# Patient Record
Sex: Male | Born: 1955 | Race: White | Hispanic: No | Marital: Married | Smoking: Former smoker
Health system: Southern US, Community
[De-identification: ages and names within clinical notes are randomized; demographics above are authoritative.]

## PROBLEM LIST (undated history)

## (undated) DIAGNOSIS — M353 Polymyalgia rheumatica: Secondary | ICD-10-CM

## (undated) DIAGNOSIS — E039 Hypothyroidism, unspecified: Secondary | ICD-10-CM

## (undated) DIAGNOSIS — E669 Obesity, unspecified: Secondary | ICD-10-CM

## (undated) DIAGNOSIS — E785 Hyperlipidemia, unspecified: Secondary | ICD-10-CM

## (undated) DIAGNOSIS — M109 Gout, unspecified: Secondary | ICD-10-CM

## (undated) DIAGNOSIS — I10 Essential (primary) hypertension: Secondary | ICD-10-CM

## (undated) DIAGNOSIS — Z8619 Personal history of other infectious and parasitic diseases: Secondary | ICD-10-CM

## (undated) DIAGNOSIS — Z87891 Personal history of nicotine dependence: Secondary | ICD-10-CM

## (undated) DIAGNOSIS — R011 Cardiac murmur, unspecified: Secondary | ICD-10-CM

## (undated) DIAGNOSIS — Z86010 Personal history of colonic polyps: Secondary | ICD-10-CM

## (undated) HISTORY — PX: ROTATOR CUFF REPAIR: SHX139

## (undated) HISTORY — DX: Gout, unspecified: M10.9

## (undated) HISTORY — DX: Essential (primary) hypertension: I10

## (undated) HISTORY — DX: Personal history of colonic polyps: Z86.010

## (undated) HISTORY — DX: Obesity, unspecified: E66.9

## (undated) HISTORY — DX: Cardiac murmur, unspecified: R01.1

## (undated) HISTORY — DX: Hyperlipidemia, unspecified: E78.5

## (undated) HISTORY — DX: Hypothyroidism, unspecified: E03.9

## (undated) HISTORY — DX: Personal history of nicotine dependence: Z87.891

## (undated) HISTORY — DX: Personal history of other infectious and parasitic diseases: Z86.19

## (undated) HISTORY — DX: Polymyalgia rheumatica: M35.3

---

## 1993-01-05 HISTORY — PX: VASECTOMY: SHX75

## 1997-05-14 ENCOUNTER — Encounter (HOSPITAL_COMMUNITY): Admission: RE | Admit: 1997-05-14 | Discharge: 1997-08-12 | Payer: Self-pay | Admitting: Psychiatry

## 2005-07-06 ENCOUNTER — Ambulatory Visit: Payer: Self-pay | Admitting: Unknown Physician Specialty

## 2007-01-06 DIAGNOSIS — Z87891 Personal history of nicotine dependence: Secondary | ICD-10-CM

## 2007-01-06 HISTORY — DX: Personal history of nicotine dependence: Z87.891

## 2008-01-06 DIAGNOSIS — Z860100 Personal history of colon polyps, unspecified: Secondary | ICD-10-CM

## 2008-01-06 DIAGNOSIS — I10 Essential (primary) hypertension: Secondary | ICD-10-CM

## 2008-01-06 DIAGNOSIS — E039 Hypothyroidism, unspecified: Secondary | ICD-10-CM

## 2008-01-06 HISTORY — DX: Personal history of colon polyps, unspecified: Z86.0100

## 2008-01-06 HISTORY — DX: Hypothyroidism, unspecified: E03.9

## 2008-01-06 HISTORY — DX: Personal history of colonic polyps: Z86.010

## 2008-01-06 HISTORY — DX: Essential (primary) hypertension: I10

## 2009-07-05 HISTORY — PX: COLONOSCOPY: SHX174

## 2013-03-15 ENCOUNTER — Ambulatory Visit (INDEPENDENT_AMBULATORY_CARE_PROVIDER_SITE_OTHER): Payer: 59 | Admitting: Family Medicine

## 2013-03-15 ENCOUNTER — Encounter: Payer: Self-pay | Admitting: Family Medicine

## 2013-03-15 ENCOUNTER — Telehealth: Payer: Self-pay | Admitting: Family Medicine

## 2013-03-15 VITALS — BP 144/80 | HR 84 | Temp 98.3°F | Ht 75.0 in | Wt 279.5 lb

## 2013-03-15 DIAGNOSIS — I6529 Occlusion and stenosis of unspecified carotid artery: Secondary | ICD-10-CM | POA: Insufficient documentation

## 2013-03-15 DIAGNOSIS — E785 Hyperlipidemia, unspecified: Secondary | ICD-10-CM

## 2013-03-15 DIAGNOSIS — I10 Essential (primary) hypertension: Secondary | ICD-10-CM

## 2013-03-15 DIAGNOSIS — Z86010 Personal history of colonic polyps: Secondary | ICD-10-CM | POA: Insufficient documentation

## 2013-03-15 DIAGNOSIS — Z87891 Personal history of nicotine dependence: Secondary | ICD-10-CM | POA: Insufficient documentation

## 2013-03-15 DIAGNOSIS — M109 Gout, unspecified: Secondary | ICD-10-CM

## 2013-03-15 DIAGNOSIS — Z125 Encounter for screening for malignant neoplasm of prostate: Secondary | ICD-10-CM

## 2013-03-15 DIAGNOSIS — E039 Hypothyroidism, unspecified: Secondary | ICD-10-CM

## 2013-03-15 DIAGNOSIS — Z0000 Encounter for general adult medical examination without abnormal findings: Secondary | ICD-10-CM | POA: Insufficient documentation

## 2013-03-15 DIAGNOSIS — R0989 Other specified symptoms and signs involving the circulatory and respiratory systems: Secondary | ICD-10-CM

## 2013-03-15 NOTE — Progress Notes (Signed)
BP 144/80  Pulse 84  Temp(Src) 98.3 F (36.8 C) (Oral)  Ht 6\' 3"  (1.905 m)  Wt 279 lb 8 oz (126.78 kg)  BMI 34.94 kg/m2   CC: new pt to establish would like CPE Subjective:    Patient ID: Robert Shea, male    DOB: Sep 25, 1955, 58 y.o.   MRN: [id number]  HPI: Robert Shea is a 59 y.o. male presenting on 03/15/2013 with Establish Care   Robert Shea presents today to establish care.  Prior saw PA [place].  Here has seen Dr. Hardin Negus.  [place] is primary residence.  Would like to have CPE today.  Hypothyroidism - 2010 - compliant with levothyroxine 166mcg HTN - compliant with amlodipine 10mg  daily HLD - compliant with simvastatin 20mg  daily - takes in am because forgets at night time. Ex smoker - heavy smoking, quit 6 yrs ago Gout - takes prn colchicine.  Flares Qseveral months.  Has tried allopurinol in past - not in last 2 years.  Over last 6 months noticing more frequent flares.  Uloric caused severe gout flare - refuses to retry.  Interested in retrial of allopurinol.  ?carotid bruit on left - s/p carotid US at [facility] - I have requested records today.  Did have stress test done at [facility] recently 2014.  Preventative: Last blood work >1 yr ago. Colonoscopy 2010 2 polyps, rec rpt 5 yrs.  Due for rpt. Prostate - discussed, would like screening for now with PSA/DRE Flu - done Tetanus - unsure  Lives with wife Part time lives in [place] part time local Occ: [employer]: BS Activity: gym 3x/wk  Diet: healthier recently - good water, fruits/vegetables daily  Relevant past medical, surgical, family and social history reviewed and updated as indicated.  Allergies and medications reviewed and updated. No current outpatient prescriptions on file prior to visit.   No current facility-administered medications on file prior to visit.    Review of Systems  Constitutional: Negative for fever, chills, activity change, appetite change, fatigue and  unexpected weight change.  HENT: Negative for hearing loss.   Eyes: Negative for visual disturbance.  Respiratory: Negative for cough, chest tightness, shortness of breath and wheezing.   Cardiovascular: Negative for chest pain, palpitations and leg swelling.  Gastrointestinal: Positive for constipation (occasional). Negative for nausea, vomiting, abdominal pain, diarrhea, blood in stool (occasionally when wiping) and abdominal distention.  Genitourinary: Negative for hematuria and difficulty urinating.  Musculoskeletal: Negative for arthralgias, myalgias and neck pain.  Skin: Negative for rash.  Neurological: Negative for dizziness, seizures, syncope and headaches.  Hematological: Negative for adenopathy. Does not bruise/bleed easily.  Psychiatric/Behavioral: Negative for dysphoric mood. The patient is not nervous/anxious.    Per HPI unless specifically indicated above    Objective:    BP 144/80  Pulse 84  Temp(Src) 98.3 F (36.8 C) (Oral)  Ht 6\' 3"  (1.905 m)  Wt 279 lb 8 oz (126.78 kg)  BMI 34.94 kg/m2  Physical Exam  Nursing note and vitals reviewed. Constitutional: He is oriented to person, place, and time. He appears well-developed and well-nourished. No distress.  HENT:  Head: Normocephalic and atraumatic.  Right Ear: Hearing, tympanic membrane, external ear and ear canal normal.  Left Ear: Hearing, tympanic membrane, external ear and ear canal normal.  Nose: Nose normal.  Mouth/Throat: Uvula is midline, oropharynx is clear and moist and mucous membranes are normal. No oropharyngeal exudate, posterior oropharyngeal edema or posterior oropharyngeal erythema.  Eyes: Conjunctivae  and EOM are normal. Pupils are equal, round, and reactive to light. No scleral icterus.  Neck: Normal range of motion. Neck supple. Carotid bruit is present (L). No thyromegaly present.  Cardiovascular: Normal rate, regular rhythm and intact distal pulses.   Murmur (faint) heard. Pulses:      Radial  pulses are 2+ on the right side, and 2+ on the left side.  Pulmonary/Chest: Effort normal and breath sounds normal. No respiratory distress. He has no wheezes. He has no rales.  Abdominal: Soft. Bowel sounds are normal. He exhibits no distension and no mass. There is no tenderness. There is no rebound and no guarding.  Genitourinary: Rectum normal and prostate normal. Rectal exam shows no external hemorrhoid, no internal hemorrhoid, no fissure, no mass, no tenderness and anal tone normal. Prostate is not enlarged (15gm) and not tender.  Musculoskeletal: Normal range of motion. He exhibits no edema.  Lymphadenopathy:    He has no cervical adenopathy.  Neurological: He is alert and oriented to person, place, and time.  CN grossly intact, station and gait intact  Skin: Skin is warm and dry. No rash noted.  Psychiatric: He has a normal mood and affect. His behavior is normal. Judgment and thought content normal.   No results found for this or any previous visit.    Assessment & Plan:   Problem List Items Addressed This Visit   Ex-smoker   Gout     More frequent flares recently. Discussed starting allopurinol 100mg  daily and will check UA level next week. Discussed use of colchicine or ibuprofen prn while starting allopurinol.    Relevant Orders      Uric acid   Health care maintenance - Primary     Preventative protocols reviewed and updated unless pt declined. Discussed healthy diet and lifestyle. DRE reassuring. PSA when returns fasting.    History of colon polyps     Will refer for colonoscopy.    Relevant Orders      Ambulatory referral to Gastroenterology   HLD (hyperlipidemia)     Continue simvastatin. Check FLP next fasting blood work.    Relevant Medications      amLODipine (NORVASC) 10 MG tablet      simvastatin (ZOCOR) 20 MG tablet   Other Relevant Orders      Lipid panel      Comprehensive metabolic panel   HTN (hypertension)     Chronic, stable. Continue med.      Relevant Orders      Comprehensive metabolic panel   Hypothyroidism     Check TSH next fasting blood work    Relevant Medications      levothyroxine (SYNTHROID, LEVOTHROID) 100 MCG tablet   Other Relevant Orders      TSH   Left carotid bruit     Await ultrasound report from [facility].     Other Visit Diagnoses   Special screening for malignant neoplasm of prostate        Relevant Orders       PSA        Follow up plan: Return in about 6 months (around 09/15/2013), or as needed, for follow up.

## 2013-03-15 NOTE — Assessment & Plan Note (Signed)
More frequent flares recently. Discussed starting allopurinol 100mg  daily and will check UA level next week. Discussed use of colchicine or ibuprofen prn while starting allopurinol.

## 2013-03-15 NOTE — Assessment & Plan Note (Signed)
Preventative protocols reviewed and updated unless pt declined. Discussed healthy diet and lifestyle. DRE reassuring. PSA when returns fasting.

## 2013-03-15 NOTE — Patient Instructions (Signed)
Return fasting for blood work next week. Start allopurinol 100mg  daily. We will set you up for colonoscopy. I will request records from [facility] in [place]. Good to meet you today, call us with questions.

## 2013-03-15 NOTE — Addendum Note (Signed)
Addended by: Ria Bush on: 03/15/2013 11:32 AM   Modules accepted: Level of Service

## 2013-03-15 NOTE — Assessment & Plan Note (Signed)
Check TSH next fasting blood work

## 2013-03-15 NOTE — Assessment & Plan Note (Signed)
Continue simvastatin. Check FLP next fasting blood work.

## 2013-03-15 NOTE — Assessment & Plan Note (Signed)
Chronic, stable. Continue med. 

## 2013-03-15 NOTE — Assessment & Plan Note (Signed)
Await ultrasound report from [facility].

## 2013-03-15 NOTE — Telephone Encounter (Signed)
Relevant patient education assigned to patient using Emmi. ° °

## 2013-03-15 NOTE — Progress Notes (Signed)
Pre visit review using our clinic review tool, if applicable. No additional management support is needed unless otherwise documented below in the visit note. 

## 2013-03-15 NOTE — Assessment & Plan Note (Signed)
Will refer for colonoscopy

## 2013-03-22 ENCOUNTER — Other Ambulatory Visit (INDEPENDENT_AMBULATORY_CARE_PROVIDER_SITE_OTHER): Payer: 59

## 2013-03-22 DIAGNOSIS — I10 Essential (primary) hypertension: Secondary | ICD-10-CM

## 2013-03-22 DIAGNOSIS — E039 Hypothyroidism, unspecified: Secondary | ICD-10-CM

## 2013-03-22 DIAGNOSIS — Z125 Encounter for screening for malignant neoplasm of prostate: Secondary | ICD-10-CM

## 2013-03-22 DIAGNOSIS — E785 Hyperlipidemia, unspecified: Secondary | ICD-10-CM

## 2013-03-22 DIAGNOSIS — M109 Gout, unspecified: Secondary | ICD-10-CM

## 2013-03-22 LAB — COMPREHENSIVE METABOLIC PANEL
ALBUMIN: 4.8 g/dL (ref 3.5–5.2)
ALT: 30 U/L (ref 0–53)
AST: 24 U/L (ref 0–37)
Alkaline Phosphatase: 63 U/L (ref 39–117)
BUN: 21 mg/dL (ref 6–23)
CO2: 23 mEq/L (ref 19–32)
CREATININE: 1.1 mg/dL (ref 0.4–1.5)
Calcium: 9.4 mg/dL (ref 8.4–10.5)
Chloride: 102 mEq/L (ref 96–112)
GFR: 73.38 mL/min (ref >60.00)
GLUCOSE: 130 mg/dL — AB (ref 70–99)
POTASSIUM: 4.3 meq/L (ref 3.5–5.1)
Sodium: 136 mEq/L (ref 135–145)
Total Bilirubin: 0.7 mg/dL (ref 0.3–1.2)
Total Protein: 7.3 g/dL (ref 6.0–8.3)

## 2013-03-22 LAB — URIC ACID: Uric Acid, Serum: 5.5 mg/dL (ref 4.0–7.8)

## 2013-03-22 LAB — LIPID PANEL
Cholesterol: 170 mg/dL (ref 0–200)
HDL: 39.2 mg/dL (ref >39.00)
LDL Cholesterol: 88 mg/dL (ref 0–99)
TRIGLYCERIDES: 212 mg/dL — AB (ref 0.0–149.0)
Total CHOL/HDL Ratio: 4
VLDL: 42.4 mg/dL — AB (ref 0.0–40.0)

## 2013-03-22 LAB — PSA: PSA: 2.47 ng/mL (ref 0.10–4.00)

## 2013-03-22 LAB — TSH: TSH: 2.72 u[IU]/mL (ref 0.35–5.50)

## 2013-03-23 ENCOUNTER — Encounter: Payer: Self-pay | Admitting: Gastroenterology

## 2013-03-23 ENCOUNTER — Encounter: Payer: Self-pay | Admitting: Family Medicine

## 2013-03-25 ENCOUNTER — Encounter: Payer: Self-pay | Admitting: Family Medicine

## 2013-03-25 DIAGNOSIS — R739 Hyperglycemia, unspecified: Secondary | ICD-10-CM | POA: Insufficient documentation

## 2013-03-25 DIAGNOSIS — E669 Obesity, unspecified: Secondary | ICD-10-CM | POA: Insufficient documentation

## 2013-03-25 DIAGNOSIS — R7303 Prediabetes: Secondary | ICD-10-CM | POA: Insufficient documentation

## 2013-03-27 ENCOUNTER — Encounter: Payer: Self-pay | Admitting: *Deleted

## 2013-03-28 ENCOUNTER — Telehealth: Payer: Self-pay

## 2013-03-28 NOTE — Telephone Encounter (Signed)
Pt request recent lab results; pt notified as instructed by result note.

## 2013-04-24 ENCOUNTER — Ambulatory Visit (AMBULATORY_SURGERY_CENTER): Payer: 59

## 2013-04-24 VITALS — Ht 75.5 in | Wt 279.0 lb

## 2013-04-24 DIAGNOSIS — Z860100 Personal history of colon polyps, unspecified: Secondary | ICD-10-CM

## 2013-04-24 MED ORDER — MOVIPREP 100 G PO SOLR: 1.0000 | Freq: Once | ORAL | Status: DC

## 2013-04-24 NOTE — Progress Notes (Signed)
No allergies eggs or soy. No problems with anesthesia in the past. No home O2 use. No diet/weight loss meds.

## 2013-04-28 ENCOUNTER — Encounter: Payer: Self-pay | Admitting: Gastroenterology

## 2013-05-05 HISTORY — PX: COLONOSCOPY: SHX174

## 2013-05-08 ENCOUNTER — Ambulatory Visit (AMBULATORY_SURGERY_CENTER): Payer: 59 | Admitting: Gastroenterology

## 2013-05-08 ENCOUNTER — Telehealth: Payer: Self-pay

## 2013-05-08 ENCOUNTER — Encounter: Payer: Self-pay | Admitting: Gastroenterology

## 2013-05-08 ENCOUNTER — Encounter: Payer: 59 | Admitting: Gastroenterology

## 2013-05-08 VITALS — BP 152/80 | HR 69 | Temp 97.0°F | Resp 14 | Ht 75.0 in | Wt 279.0 lb

## 2013-05-08 DIAGNOSIS — D126 Benign neoplasm of colon, unspecified: Secondary | ICD-10-CM

## 2013-05-08 DIAGNOSIS — Z86010 Personal history of colonic polyps: Secondary | ICD-10-CM

## 2013-05-08 MED ORDER — SODIUM CHLORIDE 0.9 % IV SOLN: 500.0000 mL | INTRAVENOUS | Status: DC

## 2013-05-08 NOTE — Telephone Encounter (Signed)
Request colonoscopy reports, associated pathology reports  from your colonoscopy at [facility] about 10 years ago and  colonoscopy in [place] about 5 years ago.

## 2013-05-08 NOTE — Telephone Encounter (Signed)
Pt had procedure today I will call him tomorrow and get the information needed to get records

## 2013-05-08 NOTE — Patient Instructions (Signed)
YOU HAD AN ENDOSCOPIC PROCEDURE TODAY AT THE [facility]: Refer to the procedure report that was given to you for any specific questions about what was found during the examination.  If the procedure report does not answer your questions, please call your gastroenterologist to clarify.  If you requested that your care partner not be given the details of your procedure findings, then the procedure report has been included in a sealed envelope for you to review at your convenience later.  YOU SHOULD EXPECT: Some feelings of bloating in the abdomen. Passage of more gas than usual.  Walking can help get rid of the air that was put into your GI tract during the procedure and reduce the bloating. If you had a lower endoscopy (such as a colonoscopy or flexible sigmoidoscopy) you may notice spotting of blood in your stool or on the toilet paper. If you underwent a bowel prep for your procedure, then you may not have a normal bowel movement for a few days.  DIET: Your first meal following the procedure should be a light meal and then it is ok to progress to your normal diet.  A half-sandwich or bowl of soup is an example of a good first meal.  Heavy or fried foods are harder to digest and may make you feel nauseous or bloated.  Likewise meals heavy in dairy and vegetables can cause extra gas to form and this can also increase the bloating.  Drink plenty of fluids but you should avoid alcoholic beverages for 24 hours.  ACTIVITY: Your care partner should take you home directly after the procedure.  You should plan to take it easy, moving slowly for the rest of the day.  You can resume normal activity the day after the procedure however you should NOT DRIVE or use heavy machinery for 24 hours (because of the sedation medicines used during the test).    SYMPTOMS TO REPORT IMMEDIATELY: A gastroenterologist can be reached at any hour.  During normal business hours, 8:30 AM to 5:00 PM Monday through Friday,  call [phone].  After hours and on weekends, please call the GI answering service at [phone] who will take a message and have the physician on call contact you.   Following lower endoscopy (colonoscopy or flexible sigmoidoscopy):  Excessive amounts of blood in the stool  Significant tenderness or worsening of abdominal pains  Swelling of the abdomen that is new, acute  Fever of 100F or higher  FOLLOW UP: If any biopsies were taken you will be contacted by phone or by letter within the next 1-3 weeks.  Call your gastroenterologist if you have not heard about the biopsies in 3 weeks.  Our staff will call the home number listed on your records the next business day following your procedure to check on you and address any questions or concerns that you may have at that time regarding the information given to you following your procedure. This is a courtesy call and so if there is no answer at the home number and we have not heard from you through the emergency physician on call, we will assume that you have returned to your regular daily activities without incident.  SIGNATURES/CONFIDENTIALITY: You and/or your care partner have signed paperwork which will be entered into your electronic medical record.  These signatures attest to the fact that that the information above on your After Visit Summary has been reviewed and is understood.  Full responsibility of the confidentiality of this   discharge information lies with you and/or your care-partner.  Handout on polyps given to patient.

## 2013-05-08 NOTE — Op Note (Signed)
[place]  [address] [place], [id number]   COLONOSCOPY PROCEDURE REPORT  PATIENT: Robert, Shea  MR#: [id number] BIRTHDATE: January 13, 1955 , 25  yrs. old GENDER: Male ENDOSCOPIST: Milus Banister, MD REFERRED NW:GNFAOZ Gutierrez, M.D. PROCEDURE DATE:  05/08/2013 PROCEDURE:   Colonoscopy with biopsy First Screening Colonoscopy - Avg.  risk and is 50 yrs.  old or older - No.  Prior Negative Screening - Now for repeat screening. N/A  History of Adenoma - Now for follow-up colonoscopy & has been > or = to 3 yrs.  N/A  Polyps Removed Today? Yes. ASA CLASS:   Class II INDICATIONS:colon polyps about 10 years ago [facility]), colon polyp about 5 years ago [facility]), unknown pathology. MEDICATIONS: MAC sedation, administered by CRNA and propofol (Diprivan) 400mg  IV  DESCRIPTION OF PROCEDURE:   After the risks benefits and alternatives of the procedure were thoroughly explained, informed consent was obtained.  A digital rectal exam revealed no abnormalities of the rectum.   The [id number]  endoscope was introduced through the anus and advanced to the cecum, which was identified by both the appendix and ileocecal valve. No adverse events experienced.   The quality of the prep was good.  The instrument was then slowly withdrawn as the colon was fully examined.    COLON FINDINGS: One polyp was found, removed and sent to pathology. This was sessile, 14mm across, located in rectum, removed with biopsy forceps (path jar 1).  The examination was otherwise normal. Retroflexed views revealed no abnormalities. The time to cecum=1 minutes 32 seconds.  Withdrawal time=9 minutes 43 seconds.  The scope was withdrawn and the procedure completed. COMPLICATIONS: There were no complications.  ENDOSCOPIC IMPRESSION: One polyp was found, removed and sent to pathology. The examination was otherwise normal.  RECOMMENDATIONS: You will receive a letter within 1-2  weeks with the results of your biopsy as well as final recommendations.  Please call my office if you have not received a letter after 3 weeks. Dr. Ardis Hughs' office will get in touch with you to help track down your previous colonoscopy reports, associated pathology reports from your colonoscopy at [facility] about 10 years ago and colonoscopy in [facility] about 5 years ago.  Those results may factor in to the timing of your next examination.   eSigned:  Milus Banister, MD 05/08/2013 10:17 AM

## 2013-05-08 NOTE — Progress Notes (Signed)
Procedure ends, to recovery, report given and VSS. 

## 2013-05-08 NOTE — Progress Notes (Signed)
Called to room to assist during endoscopic procedure.  Patient ID and intended procedure confirmed with present staff. Received instructions for my participation in the procedure from the performing physician.  

## 2013-05-09 ENCOUNTER — Telehealth: Payer: Self-pay | Admitting: *Deleted

## 2013-05-09 NOTE — Telephone Encounter (Signed)
No answer, message left for the patient. 

## 2013-05-09 NOTE — Telephone Encounter (Signed)
Release received and will be faxed

## 2013-05-09 NOTE — Telephone Encounter (Signed)
Left message on machine to call back  

## 2013-05-14 ENCOUNTER — Encounter: Payer: Self-pay | Admitting: Family Medicine

## 2013-07-26 ENCOUNTER — Ambulatory Visit (INDEPENDENT_AMBULATORY_CARE_PROVIDER_SITE_OTHER): Payer: 59 | Admitting: Family Medicine

## 2013-07-26 ENCOUNTER — Encounter: Payer: Self-pay | Admitting: Family Medicine

## 2013-07-26 VITALS — BP 158/80 | HR 76 | Temp 98.0°F | Wt 281.5 lb

## 2013-07-26 DIAGNOSIS — M1A9XX0 Chronic gout, unspecified, without tophus (tophi): Secondary | ICD-10-CM

## 2013-07-26 DIAGNOSIS — M19072 Primary osteoarthritis, left ankle and foot: Secondary | ICD-10-CM

## 2013-07-26 DIAGNOSIS — M79671 Pain in right foot: Secondary | ICD-10-CM

## 2013-07-26 DIAGNOSIS — M79672 Pain in left foot: Principal | ICD-10-CM

## 2013-07-26 DIAGNOSIS — E669 Obesity, unspecified: Secondary | ICD-10-CM

## 2013-07-26 DIAGNOSIS — M79609 Pain in unspecified limb: Secondary | ICD-10-CM

## 2013-07-26 DIAGNOSIS — M19071 Primary osteoarthritis, right ankle and foot: Secondary | ICD-10-CM | POA: Insufficient documentation

## 2013-07-26 DIAGNOSIS — M1A00X0 Idiopathic chronic gout, unspecified site, without tophus (tophi): Secondary | ICD-10-CM

## 2013-07-26 MED ORDER — LORCASERIN HCL 10 MG PO TABS: 10.0000 mg | ORAL_TABLET | Freq: Two times a day (BID) | ORAL | Status: DC

## 2013-07-26 MED ORDER — SIMVASTATIN 20 MG PO TABS: 20.0000 mg | ORAL_TABLET | Freq: Every day | ORAL | Status: DC

## 2013-07-26 MED ORDER — PREDNISONE 20 MG PO TABS: ORAL_TABLET | ORAL | Status: DC

## 2013-07-26 MED ORDER — LEVOTHYROXINE SODIUM 100 MCG PO TABS: 100.0000 ug | ORAL_TABLET | Freq: Every day | ORAL | Status: DC

## 2013-07-26 MED ORDER — ALLOPURINOL 100 MG PO TABS: 200.0000 mg | ORAL_TABLET | Freq: Every day | ORAL | Status: DC

## 2013-07-26 MED ORDER — COLCHICINE 0.6 MG PO TABS: 0.6000 mg | ORAL_TABLET | Freq: Every day | ORAL | Status: DC

## 2013-07-26 MED ORDER — AMLODIPINE BESYLATE 10 MG PO TABS: 10.0000 mg | ORAL_TABLET | Freq: Every day | ORAL | Status: DC

## 2013-07-26 NOTE — Progress Notes (Signed)
BP 158/80  Pulse 76  Temp(Src) 98 F (36.7 C) (Oral)  Wt 281 lb 8 oz (127.688 kg)   CC: discuss gout meds  Subjective:    Patient ID: Robert Shea, male    DOB: November 11, 1955, 58 y.o.   MRN: [id number]  HPI: Robert Shea is a 58 y.o. male presenting on 07/26/2013 for Discuss meds   Gout - takes prn colchicine. Flares Qseveral months. Has tried allopurinol in past - not in last 2 years. Over last 6 months noticing more frequent flares. Uloric caused severe gout flare - refuses to retry. Interested in retrial of allopurinol.   Since starting allopurinol 100mg  daily has still had a dozen flares in last few months. Predominantly biltaeral feet.   No results found for this basename: URICACID  last uric acid 5.5 (03/2013) after 1 week of allopurinol 100mg  dose.   Requests to take colchicine 1 pill daily which is what has worked the best for him.  When in gout flare, endorses ibuprofen alone does not help at all.   Endorses h/o foot issues in past. States has arthritis in feet. H/o mutiple fractures in past. Has seen multiple provides in past, including podiatrists and has braces to wear on feet. Has had gout flares in several different joints throughout body.  Feet feel stiff in arms. Does get synovitis/arthralgias. predominantly feet, also R>L knees, MCP of 1st and 2nd hands, bilateral elbows. Colchicine always knocks pain out. No recent flares in upper extremities.  No fevers/chills, new rashes, eye complaints.  Strong fmhx thyroid disease. On levothyroxine 132mcg daily.  Wt Readings from Last 3 Encounters:  07/26/13 281 lb 8 oz (127.688 kg)  05/08/13 279 lb (126.554 kg)  04/24/13 279 lb (126.554 kg)  Body mass index is 35.19 kg/(m^2). Tries to eat healthy, bikes 7 mi 4-5x/wk. Noted persistent trouble with weight. Requests assistance with this.    Past Medical History  Diagnosis Date  . Gout   . Heart murmur longstanding  . HTN (hypertension) 2010  . HLD (hyperlipidemia)     . History of colon polyps 2010    benign  . Hypothyroidism 2010  . Ex-smoker 2009    quit  . Obesity    Family History  Problem Relation Age of Onset  . Cancer Father 57    bone?  . Cancer Maternal Uncle     throat, smoker  . Throat cancer Maternal Uncle   . Diabetes Brother   . Diabetes Mother   . CAD Paternal Grandfather     MI  . Stroke Paternal Grandfather   . Hypertension Brother   . Colon cancer Neg Hx   . Pancreatic cancer Neg Hx   . Stomach cancer Neg Hx   . Rectal cancer Neg Hx     Relevant past medical, surgical, family and social history reviewed and updated as indicated.  Allergies and medications reviewed and updated. No current outpatient prescriptions on file prior to visit.   No current facility-administered medications on file prior to visit.    Review of Systems Per HPI unless specifically indicated above    Objective:    BP 158/80  Pulse 76  Temp(Src) 98 F (36.7 C) (Oral)  Wt 281 lb 8 oz (127.688 kg)  Physical Exam  Nursing note and vitals reviewed. Constitutional: He appears well-developed and well-nourished. No distress.  Musculoskeletal: He exhibits edema.  No active synovitis. No hand pain at MCPs or wrists. No evident tophi. R 1st MTP swollen, but  not tender to palpation.  Tender with plantar flexion at MTP. diminished pulses bilaterally  Skin: Skin is warm and dry. No rash noted. No erythema.   Results for orders placed in visit on 03/22/13  URIC ACID      Result Value Ref Range   Uric Acid, Serum 5.5  4.0 - 7.8 mg/dL  PSA      Result Value Ref Range   PSA 2.47  0.10 - 4.00 ng/mL  LIPID PANEL      Result Value Ref Range   Cholesterol 170  0 - 200 mg/dL   Triglycerides 212.0 (*) 0.0 - 149.0 mg/dL   HDL 39.20  >39.00 mg/dL   VLDL 42.4 (*) 0.0 - 40.0 mg/dL   LDL Cholesterol 88  0 - 99 mg/dL   Total CHOL/HDL Ratio 4    COMPREHENSIVE METABOLIC PANEL      Result Value Ref Range   Sodium 136  135 - 145 mEq/L   Potassium 4.3   3.5 - 5.1 mEq/L   Chloride 102  96 - 112 mEq/L   CO2 23  19 - 32 mEq/L   Glucose, Bld 130 (*) 70 - 99 mg/dL   BUN 21  6 - 23 mg/dL   Creatinine, Ser 1.1  0.4 - 1.5 mg/dL   Total Bilirubin 0.7  0.3 - 1.2 mg/dL   Alkaline Phosphatase 63  39 - 117 U/L   AST 24  0 - 37 U/L   ALT 30  0 - 53 U/L   Total Protein 7.3  6.0 - 8.3 g/dL   Albumin 4.8  3.5 - 5.2 g/dL   Calcium 9.4  8.4 - 10.5 mg/dL   GFR 73.38  >60.00 mL/min  TSH      Result Value Ref Range   TSH 2.72  0.35 - 5.50 uIU/mL      Assessment & Plan:   Problem List Items Addressed This Visit   Obesity     Body mass index is 35.19 kg/(m^2).  Reviewed healthy diet/lifestyle changes. Discussed bariatric meds. Discussed bariatric surgery. Discussed risks and benefits of bariatric meds specifically belviq. Will start 10mg  belviq daily for 1 wk then increase to 10mg  bid. Discussed mechanism of action and monitoring for nausea, headache, mood changes. Discussed small cardiovascular risk of medication.    Relevant Medications      Lorcaserin HCl (BELVIQ) 10 MG TABS   Gout     See above. Increase allopurinol to 200mg  daily.    Bilateral foot pain - Primary     Uric acid level was stable at 5.5 last check, but pt endorses persistent flares despite allopurinol 100mg  daily. Overall stable urate, not as responsive as would be expected on allopurinol. Still anticipate gout related - so will increase allopurinol to 200mg  for now. Provided with steroid course in case gout flare with increased allopurinol. Continue colchicine 0.6mg  one tablet daily as this is what has worked best for him. Pt education handout on gout provided today.        Follow up plan: Return in about 1 month (around 08/26/2013), or as needed, for follow up visit.

## 2013-07-26 NOTE — Progress Notes (Signed)
Pre visit review using our clinic review tool, if applicable. No additional management support is needed unless otherwise documented below in the visit note. 

## 2013-07-26 NOTE — Assessment & Plan Note (Signed)
Elevated today. Recheck next visit.

## 2013-07-26 NOTE — Assessment & Plan Note (Signed)
Body mass index is 35.19 kg/(m^2).  Reviewed healthy diet/lifestyle changes. Discussed bariatric meds. Discussed bariatric surgery. Discussed risks and benefits of bariatric meds specifically belviq. Will start 10mg  belviq daily for 1 wk then increase to 10mg  bid. Discussed mechanism of action and monitoring for nausea, headache, mood changes. Discussed small cardiovascular risk of medication.

## 2013-07-26 NOTE — Assessment & Plan Note (Addendum)
See above. Increase allopurinol to 200mg  daily. Gout handout provided today.

## 2013-07-26 NOTE — Assessment & Plan Note (Addendum)
Uric acid level was stable at 5.5 last check, but pt endorses persistent flares despite allopurinol 100mg  daily. Overall stable urate, not as responsive as would be expected on allopurinol. Still anticipate gout related - so will increase allopurinol to 200mg  for now. Provided with steroid course in case gout flare with increased allopurinol. Continue colchicine 0.6mg  one tablet daily as this is what has worked best for him. Pt education handout on gout provided today.

## 2013-07-26 NOTE — Patient Instructions (Addendum)
Meds refilled. Continue colchicine once daily. Increase allopurinol to 200mg  daily for next few weeks. May take prednisone course if needed for gout flare. Try belviq at 10mg  once daily for 1 week then increase to twice daily. Return in 1 month for follow up weight and gout.  Gout Gout is an inflammatory arthritis caused by a buildup of uric acid crystals in the joints. Uric acid is a chemical that is normally present in the blood. When the level of uric acid in the blood is too high it can form crystals that deposit in your joints and tissues. This causes joint redness, soreness, and swelling (inflammation). Repeat attacks are common. Over time, uric acid crystals can form into masses (tophi) near a joint, destroying bone and causing disfigurement. Gout is treatable and often preventable. CAUSES  The disease begins with elevated levels of uric acid in the blood. Uric acid is produced by your body when it breaks down a naturally found substance called purines. Certain foods you eat, such as meats and fish, contain high amounts of purines. Causes of an elevated uric acid level include:  Being passed down from parent to child (heredity).  Diseases that cause increased uric acid production (such as obesity, psoriasis, and certain cancers).  Excessive alcohol use.  Diet, especially diets rich in meat and seafood.  Medicines, including certain cancer-fighting medicines (chemotherapy), water pills (diuretics), and aspirin.  Chronic kidney disease. The kidneys are no longer able to remove uric acid well.  Problems with metabolism. Conditions strongly associated with gout include:  Obesity.  High blood pressure.  High cholesterol.  Diabetes. Not everyone with elevated uric acid levels gets gout. It is not understood why some people get gout and others do not. Surgery, joint injury, and eating too much of certain foods are some of the factors that can lead to gout attacks. SYMPTOMS   An  attack of gout comes on quickly. It causes intense pain with redness, swelling, and warmth in a joint.  Fever can occur.  Often, only one joint is involved. Certain joints are more commonly involved:  Base of the big toe.  Knee.  Ankle.  Wrist.  Finger. Without treatment, an attack usually goes away in a few days to weeks. Between attacks, you usually will not have symptoms, which is different from many other forms of arthritis. DIAGNOSIS  Your caregiver will suspect gout based on your symptoms and exam. In some cases, tests may be recommended. The tests may include:  Blood tests.  Urine tests.  X-rays.  Joint fluid exam. This exam requires a needle to remove fluid from the joint (arthrocentesis). Using a microscope, gout is confirmed when uric acid crystals are seen in the joint fluid. TREATMENT  There are two phases to gout treatment: treating the sudden onset (acute) attack and preventing attacks (prophylaxis).  Treatment of an Acute Attack.  Medicines are used. These include anti-inflammatory medicines or steroid medicines.  An injection of steroid medicine into the affected joint is sometimes necessary.  The painful joint is rested. Movement can worsen the arthritis.  You may use warm or cold treatments on painful joints, depending which works best for you.  Treatment to Prevent Attacks.  If you suffer from frequent gout attacks, your caregiver may advise preventive medicine. These medicines are started after the acute attack subsides. These medicines either help your kidneys eliminate uric acid from your body or decrease your uric acid production. You may need to stay on these medicines for a very  long time.  The early phase of treatment with preventive medicine can be associated with an increase in acute gout attacks. For this reason, during the first few months of treatment, your caregiver may also advise you to take medicines usually used for acute gout treatment.  Be sure you understand your caregiver's directions. Your caregiver may make several adjustments to your medicine dose before these medicines are effective.  Discuss dietary treatment with your caregiver or dietitian. Alcohol and drinks high in sugar and fructose and foods such as meat, poultry, and seafood can increase uric acid levels. Your caregiver or dietician can advise you on drinks and foods that should be limited. HOME CARE INSTRUCTIONS   Do not take aspirin to relieve pain. This raises uric acid levels.  Only take over-the-counter or prescription medicines for pain, discomfort, or fever as directed by your caregiver.  Rest the joint as much as possible. When in bed, keep sheets and blankets off painful areas.  Keep the affected joint raised (elevated).  Apply warm or cold treatments to painful joints. Use of warm or cold treatments depends on which works best for you.  Use crutches if the painful joint is in your leg.  Drink enough fluids to keep your urine clear or pale yellow. This helps your body get rid of uric acid. Limit alcohol, sugary drinks, and fructose drinks.  Follow your dietary instructions. Pay careful attention to the amount of protein you eat. Your daily diet should emphasize fruits, vegetables, whole grains, and fat-free or low-fat milk products. Discuss the use of coffee, vitamin C, and cherries with your caregiver or dietician. These may be helpful in lowering uric acid levels.  Maintain a healthy body weight. SEEK MEDICAL CARE IF:   You develop diarrhea, vomiting, or any side effects from medicines.  You do not feel better in 24 hours, or you are getting worse. SEEK IMMEDIATE MEDICAL CARE IF:   Your joint becomes suddenly more tender, and you have chills or a fever. MAKE SURE YOU:   Understand these instructions.  Will watch your condition.  Will get help right away if you are not doing well or get worse. Document Released: 12/20/1999 Document  Revised: 04/18/2012 Document Reviewed: 08/05/2011 [facility] Patient Information 2015 [place], [place]. This information is not intended to replace advice given to you by your health care provider. Make sure you discuss any questions you have with your health care provider.

## 2013-08-29 ENCOUNTER — Encounter: Payer: Self-pay | Admitting: Family Medicine

## 2013-08-29 ENCOUNTER — Encounter (INDEPENDENT_AMBULATORY_CARE_PROVIDER_SITE_OTHER): Payer: Self-pay

## 2013-08-29 ENCOUNTER — Ambulatory Visit (INDEPENDENT_AMBULATORY_CARE_PROVIDER_SITE_OTHER): Payer: 59 | Admitting: Family Medicine

## 2013-08-29 VITALS — BP 148/82 | HR 68 | Temp 98.2°F | Wt 279.8 lb

## 2013-08-29 DIAGNOSIS — M79671 Pain in right foot: Secondary | ICD-10-CM

## 2013-08-29 DIAGNOSIS — M1A9XX0 Chronic gout, unspecified, without tophus (tophi): Secondary | ICD-10-CM

## 2013-08-29 DIAGNOSIS — E669 Obesity, unspecified: Secondary | ICD-10-CM

## 2013-08-29 DIAGNOSIS — M79609 Pain in unspecified limb: Secondary | ICD-10-CM

## 2013-08-29 DIAGNOSIS — M79672 Pain in left foot: Secondary | ICD-10-CM

## 2013-08-29 DIAGNOSIS — M1A00X0 Idiopathic chronic gout, unspecified site, without tophus (tophi): Secondary | ICD-10-CM

## 2013-08-29 MED ORDER — PREDNISONE 20 MG PO TABS: ORAL_TABLET | ORAL | Status: DC

## 2013-08-29 MED ORDER — LORCASERIN HCL 10 MG PO TABS: 10.0000 mg | ORAL_TABLET | Freq: Two times a day (BID) | ORAL | Status: DC

## 2013-08-29 MED ORDER — ALLOPURINOL 300 MG PO TABS: 300.0000 mg | ORAL_TABLET | Freq: Every day | ORAL | Status: DC

## 2013-08-29 NOTE — Progress Notes (Signed)
Pre visit review using our clinic review tool, if applicable. No additional management support is needed unless otherwise documented below in the visit note. 

## 2013-08-29 NOTE — Assessment & Plan Note (Signed)
Body mass index is 34.97 kg/(m^2). 2lb weight loss noted - this in setting of recent prednisone course. Will continue belviq 10mg  bid. rtc 2 mo for f/u. Continue healthy diet/lifestyle. Some dizziness noted as side effect but tolerable per patient.

## 2013-08-29 NOTE — Progress Notes (Signed)
BP 148/82  Pulse 68  Temp(Src) 98.2 F (36.8 C) (Oral)  Wt 279 lb 12 oz (126.894 kg)   CC: 1 mo f/u belviq  Subjective:    Patient ID: Robert Shea, male    DOB: March 20, 1955, 58 y.o.   MRN: [id number]  HPI: Robert Shea is a 59 y.o. male presenting on 08/29/2013 for Follow-up   [facility] ([place]aiting [place] US and prior labwork).  Obesity - 2 lb weight loss noted over last month.  Compliant with belviq. Noticing some dizziness but tolerable.  Gout - on allopurinol 200mg  daily. Also taking colcrys 0.6mg  daily preventatively.  Allopurinol 200mg  daily has not helped at all. Uloric actually severely worsened gout.  Prednisone significantly helped with gout flare earlier this month. Actually prednisone significantly helped myalgias, arthralgias "I felt [age] again". Noticing increased flares now every 2 weeks. Lab Results  Component Value Date   CREATININE 1.1 03/22/2013    Arthralgias - endorses majority of joint pains in feet and hands - currently midfoot and R index PIP. Has had heel, ankle and knee, elbows and shoulder pain in past.   Wt Readings from Last 3 Encounters:  08/29/13 279 lb 12 oz (126.894 kg)  07/26/13 281 lb 8 oz (127.688 kg)  05/08/13 279 lb (126.554 kg)  Body mass index is 34.97 kg/(m^2).  Remains active through gym and watching diet closely - limiting portion sizes.  Relevant past medical, surgical, family and social history reviewed and updated as indicated.  Allergies and medications reviewed and updated. Current Outpatient Prescriptions on File Prior to Visit  Medication Sig  . amLODipine (NORVASC) 10 MG tablet Take 1 tablet (10 mg total) by mouth daily.  . colchicine 0.6 MG tablet Take 1 tablet (0.6 mg total) by mouth daily.  Marland Kitchen levothyroxine (SYNTHROID, LEVOTHROID) 100 MCG tablet Take 1 tablet (100 mcg total) by mouth daily before breakfast.  . simvastatin (ZOCOR) 20 MG tablet Take 1 tablet (20 mg total) by  mouth daily.   No current facility-administered medications on file prior to visit.    Review of Systems Per HPI unless specifically indicated above    Objective:    BP 148/82  Pulse 68  Temp(Src) 98.2 F (36.8 C) (Oral)  Wt 279 lb 12 oz (126.894 kg)  Physical Exam  Nursing note and vitals reviewed. Constitutional: He appears well-developed and well-nourished. No distress.  HENT:  Mouth/Throat: Oropharynx is clear and moist. No oropharyngeal exudate.  Eyes: Conjunctivae and EOM are normal. Pupils are equal, round, and reactive to light.  Cardiovascular: Normal rate, regular rhythm, normal heart sounds and intact distal pulses.   No murmur heard. Pulmonary/Chest: Effort normal and breath sounds normal. No respiratory distress. He has no wheezes.  Musculoskeletal: He exhibits no edema.  Skin: Skin is warm and dry. No rash noted.        Assessment & Plan:   Problem List Items Addressed This Visit   Bilateral foot pain   Gout - Primary     Prednisone significantly helped recent flare of presumed gout. Uric acid level well controlled on latest check (5.5) but persistent gout flares. Therefore will increase allopurinol to 300mg  daily (trial of higher dose). Will also refer to rheumatology per pt request for further eval/treatment options. Intolerant to [employer] and doesn't want to retry. On colchicine 1 tab daily.    Relevant Medications      allopurinol (ZYLOPRIM) tablet      predniSONE (  DELTASONE) tablet   Other Relevant Orders      Ambulatory referral to Rheumatology   Obesity     Body mass index is 34.97 kg/(m^2). 2lb weight loss noted - this in setting of recent prednisone course. Will continue belviq 10mg  bid. rtc 2 mo for f/u. Continue healthy diet/lifestyle. Some dizziness noted as side effect but tolerable per patient.    Relevant Medications      Lorcaserin HCl (BELVIQ) 10 MG TABS       Follow up plan: Return if symptoms worsen or fail to improve.

## 2013-08-29 NOTE — Assessment & Plan Note (Signed)
Prednisone significantly helped recent flare of presumed gout. Uric acid level well controlled on latest check (5.5) but persistent gout flares. Therefore will increase allopurinol to 300mg  daily (trial of higher dose). Will also refer to rheumatology per pt request for further eval/treatment options. Intolerant to [employer] and doesn't want to retry. On colchicine 1 tab daily.

## 2013-08-29 NOTE — Patient Instructions (Addendum)
We will refer you to rheumatologist for further evaluation of gout. I've sent in prednisone prescription in case gout flare with increase in allopurinol to 300mg  daily. Continue belviq. [facility] [place] [facility]. Return in 2 months for follow up.

## 2013-09-19 ENCOUNTER — Ambulatory Visit (INDEPENDENT_AMBULATORY_CARE_PROVIDER_SITE_OTHER): Payer: 59 | Admitting: Family Medicine

## 2013-09-19 ENCOUNTER — Encounter: Payer: Self-pay | Admitting: Family Medicine

## 2013-09-19 VITALS — BP 136/80 | HR 63 | Temp 98.1°F | Resp 16 | Ht 75.0 in | Wt 278.0 lb

## 2013-09-19 DIAGNOSIS — E669 Obesity, unspecified: Secondary | ICD-10-CM

## 2013-09-19 DIAGNOSIS — Z23 Encounter for immunization: Secondary | ICD-10-CM

## 2013-09-19 DIAGNOSIS — N529 Male erectile dysfunction, unspecified: Secondary | ICD-10-CM

## 2013-09-19 DIAGNOSIS — M10041 Idiopathic gout, right hand: Secondary | ICD-10-CM

## 2013-09-19 DIAGNOSIS — M109 Gout, unspecified: Secondary | ICD-10-CM

## 2013-09-19 LAB — BASIC METABOLIC PANEL
BUN: 15 mg/dL (ref 6–23)
CO2: 28 meq/L (ref 19–32)
CREATININE: 1 mg/dL (ref 0.4–1.5)
Calcium: 9.6 mg/dL (ref 8.4–10.5)
Chloride: 102 mEq/L (ref 96–112)
GFR: 79.92 mL/min (ref >60.00)
GLUCOSE: 128 mg/dL — AB (ref 70–99)
Potassium: 4 mEq/L (ref 3.5–5.1)
Sodium: 139 mEq/L (ref 135–145)

## 2013-09-19 LAB — URIC ACID: Uric Acid, Serum: 6.7 mg/dL (ref 4.0–7.8)

## 2013-09-19 MED ORDER — SILDENAFIL CITRATE 100 MG PO TABS: 50.0000 mg | ORAL_TABLET | Freq: Every day | ORAL | Status: DC | PRN

## 2013-09-19 MED ORDER — COLCHICINE 0.6 MG PO TABS: 0.6000 mg | ORAL_TABLET | Freq: Two times a day (BID) | ORAL | Status: DC | PRN

## 2013-09-19 NOTE — Assessment & Plan Note (Signed)
Provided with viagra trial 50-100mg  prn. Discussed use as well as side effects and adverse effects to monitor for.

## 2013-09-19 NOTE — Assessment & Plan Note (Signed)
Recurrent gout flare despite uric acid in normal range last check (5.5 on 03/2013).  Continue allopurinol 300mg , increase colchicine to 0.6mg  bid for next few days. Will appreciate rheum input.  Recheck uric acid today and serum Cr.

## 2013-09-19 NOTE — Progress Notes (Signed)
BP 136/80  Pulse 63  Temp(Src) 98.1 F (36.7 C) (Oral)  Resp 16  Ht 6\' 3"  (1.905 m)  Wt 278 lb (126.1 kg)  BMI 34.75 kg/m2  SpO2 99%   CC: 1 mo f/u  Subjective:    Patient ID: [name], male    DOB: 01/16/1955, 58 y.o.   MRN: [id number]  HPI: Robert Shea is a 58 y.o. male presenting on 09/19/2013 for gout follow up   Gout - recent increase in allopurinol to 300mg  daily. Did need steroid course while increasing allopurinol. We also referred him to rheumatologist last month - he has not had f/u call by [place]. Intolerant to [employer]. Continues colchicine 1 tab daily. Today started having R 2nd MCP pain and feels gout flare coming. Has taken ibuprofen 600mg  this morning.   Obesity - on belviq 10mg  bid. 3 lb down over last 2 months. Actually stopped belviq 3 weeks ago 2/2 depressed mood.  Wt Readings from Last 3 Encounters:  09/19/13 278 lb (126.1 kg)  08/29/13 279 lb 12 oz (126.894 kg)  07/26/13 281 lb 8 oz (127.688 kg)   Body mass index is 34.75 kg/(m^2).  ED - noticed over last few weeks. Trouble maintaining erection. Denies chest pain or dyspnea.   Past Medical History  Diagnosis Date  . Gout   . Heart murmur longstanding  . HTN (hypertension) 2010  . HLD (hyperlipidemia)   . History of colon polyps 2010    benign  . Hypothyroidism 2010  . Ex-smoker 2009    quit  . Obesity    Relevant past medical, surgical, family and social history reviewed and updated as indicated.  Allergies and medications reviewed and updated. Current Outpatient Prescriptions on File Prior to Visit  Medication Sig  . allopurinol (ZYLOPRIM) 300 MG tablet Take 1 tablet (300 mg total) by mouth daily.  Marland Kitchen amLODipine (NORVASC) 10 MG tablet Take 1 tablet (10 mg total) by mouth daily.  Marland Kitchen levothyroxine (SYNTHROID, LEVOTHROID) 100 MCG tablet Take 1 tablet (100 mcg total) by mouth daily before breakfast.  . predniSONE (DELTASONE) 20 MG tablet Take two tablets daily for 3 days followed by one tablet  daily for 4 days if gout flare  . simvastatin (ZOCOR) 20 MG tablet Take 1 tablet (20 mg total) by mouth daily.   No current facility-administered medications on file prior to visit.    Review of Systems Per HPI unless specifically indicated above    Objective:    BP 136/80  Pulse 63  Temp(Src) 98.1 F (36.7 C) (Oral)  Resp 16  Ht 6\' 3"  (1.905 m)  Wt 278 lb (126.1 kg)  BMI 34.75 kg/m2  SpO2 99%  Physical Exam  Nursing note and vitals reviewed. Constitutional: He appears well-developed and well-nourished. No distress.  Musculoskeletal: He exhibits edema.  L hand WNL R hand with swollen erythematous and warm 2nd MCP joint, but ROM perserved  Skin: Skin is warm and dry. No rash noted.       Assessment & Plan:   Problem List Items Addressed This Visit   Obesity     Off belviq - caused dysthymia.    Gout - Primary     Recurrent gout flare despite uric acid in normal range last check (5.5 on 03/2013).  Continue allopurinol 300mg , increase colchicine to 0.6mg  bid for next few days. Will appreciate rheum input.  Recheck uric acid today and serum Cr.    Relevant Orders      Basic  metabolic panel      Uric acid   Erectile dysfunction     Provided with viagra trial 50-100mg  prn. Discussed use as well as side effects and adverse effects to monitor for.        Follow up plan: Return if symptoms worsen or fail to improve.

## 2013-09-19 NOTE — Assessment & Plan Note (Signed)
Off belviq - caused dysthymia.

## 2013-09-19 NOTE — Patient Instructions (Addendum)
Flu shot today See [facility]'s office to double check on rheumatologist referral. Blood work today. Increase colchicine to twice daily for next few days to treat current gout flare. While increased colchicine, hold simvastatin for a few days. Continue allopurinol 300mg  daily. Drink lots of water as well. Trial of viagra. Let me know if gout flare persists despite above.

## 2013-09-19 NOTE — Progress Notes (Signed)
Pre visit review using our clinic review tool, if applicable. No additional management support is needed unless otherwise documented below in the visit note. 

## 2013-09-24 ENCOUNTER — Other Ambulatory Visit: Payer: Self-pay | Admitting: Family Medicine

## 2013-09-24 DIAGNOSIS — M1A9XX0 Chronic gout, unspecified, without tophus (tophi): Secondary | ICD-10-CM

## 2013-09-24 MED ORDER — PREDNISONE 20 MG PO TABS: ORAL_TABLET | ORAL | Status: DC

## 2013-09-24 MED ORDER — ALLOPURINOL 100 MG PO TABS: 100.0000 mg | ORAL_TABLET | Freq: Every day | ORAL | Status: DC

## 2013-10-20 ENCOUNTER — Telehealth: Payer: Self-pay

## 2013-10-20 ENCOUNTER — Other Ambulatory Visit: Payer: Self-pay | Admitting: *Deleted

## 2013-10-20 DIAGNOSIS — M1A9XX0 Chronic gout, unspecified, without tophus (tophi): Secondary | ICD-10-CM

## 2013-10-20 MED ORDER — ALLOPURINOL 300 MG PO TABS: 450.0000 mg | ORAL_TABLET | Freq: Every day | ORAL | Status: DC

## 2013-10-20 MED ORDER — ALLOPURINOL 300 MG PO TABS: 300.0000 mg | ORAL_TABLET | Freq: Every day | ORAL | Status: DC

## 2013-10-20 NOTE — Telephone Encounter (Signed)
Pt left v/m; pt thinks his allopurinol is supposed to be increased to 450 mg. Med list has allopurinol 100 mg and 400mg . Pt said pharmacy only has 100 mg.Please advise. Pt request cb.

## 2013-10-20 NOTE — Addendum Note (Signed)
Addended by: Ria Bush on: 10/20/2013 05:29 PM   Modules accepted: Orders, Medications

## 2013-10-20 NOTE — Telephone Encounter (Addendum)
Agree but let's change dose to 450mg  daily, 1.5 tablets of 300mg  dose - new dose sent to pharmacy.

## 2013-10-20 NOTE — Telephone Encounter (Signed)
Pt left /vm; colonoscopy was done and was coded incorrectly by Dr Synthia Innocent office and pt wants recoding. Pt request cb.

## 2013-10-20 NOTE — Telephone Encounter (Signed)
Per labs-patient was to take 450mg  per day. Sent in 300mg  to take along with one and a half 100mg  tablet. Patient notified and verbalized understanding.

## 2013-10-23 ENCOUNTER — Telehealth: Payer: Self-pay | Admitting: Family Medicine

## 2013-10-23 NOTE — Telephone Encounter (Signed)
Pt called in and said his insurance did not cover 100% of his colonoscopy that was done in May of this year.  He called his insurance and they told him it's because you didn't code it as a preventative.  He says his insurance said his PCP should have coded it (I thought Dr. Ardis Hughs would have been the one to code it?). Could you please advise. Thank you.

## 2013-10-23 NOTE — Telephone Encounter (Addendum)
Looking at chart, looks like this was not a preventative colonoscopy because pt had history of colon polyps in the past. Preventative is when there is no history of prior polyp or other issue. I think this colonoscopy would have been a diagnostic colonoscopy. If he has further questions would have him call [facility] who bills the colonoscopies.

## 2013-10-23 NOTE — Telephone Encounter (Signed)
See other phone note

## 2013-10-24 NOTE — Telephone Encounter (Signed)
Spoke with patient. Will call Arby Barrette at [place] [phone] to discuss...  Spoke with Arby Barrette who states pt will be responsible for large portion of colonoscopy this year as it is a diagnostic colonoscopy.  Pt very upset about this. Apologized to patient that it was not clear to him that this would not be a preventative colonoscopy.  Will forward to Dr Ardis Hughs in case he has any other input in this matter.

## 2013-10-24 NOTE — Telephone Encounter (Signed)
I saw the history of polyps and thought it wasn't preventative b/c of that, but I wanted you to review it first. I notified pt and he does not agree w/this and wants to discuss it w/you.  He says if it's not "re-coded" correctly then he will have $3000 to pay out of pocket.  He is very upset and says he clearly spoke w/you about this being covered and you stated it would be.  He does not want to call anyone at [facility] b/c he insists his "insurance person" says this office has to be the one to change it. If you would prefer, I can call [facility] and see if I can get someone from there to contact him.  Please advise. Thank you.

## 2013-10-24 NOTE — Telephone Encounter (Signed)
Called patient, left a message  

## 2013-10-24 NOTE — Telephone Encounter (Signed)
I have stopped trying to understand why certain insurance programs will cover colonoscopies for polyps, prevention, etc and why others won't.

## 2014-01-01 ENCOUNTER — Encounter: Payer: Self-pay | Admitting: Family Medicine

## 2014-01-01 ENCOUNTER — Ambulatory Visit (INDEPENDENT_AMBULATORY_CARE_PROVIDER_SITE_OTHER): Payer: 59 | Admitting: Family Medicine

## 2014-01-01 VITALS — BP 148/92 | HR 75 | Temp 98.4°F | Ht 75.0 in | Wt 284.2 lb

## 2014-01-01 DIAGNOSIS — M545 Low back pain, unspecified: Secondary | ICD-10-CM

## 2014-01-01 DIAGNOSIS — M79629 Pain in unspecified upper arm: Secondary | ICD-10-CM

## 2014-01-01 DIAGNOSIS — M79604 Pain in right leg: Secondary | ICD-10-CM | POA: Insufficient documentation

## 2014-01-01 DIAGNOSIS — M1A9XX0 Chronic gout, unspecified, without tophus (tophi): Secondary | ICD-10-CM

## 2014-01-01 DIAGNOSIS — M25529 Pain in unspecified elbow: Secondary | ICD-10-CM

## 2014-01-01 LAB — URIC ACID: Uric Acid, Serum: 6.7 mg/dL (ref 4.0–7.8)

## 2014-01-01 LAB — SEDIMENTATION RATE: Sed Rate: 29 mm/hr — ABNORMAL HIGH (ref 0–22)

## 2014-01-01 MED ORDER — PREDNISONE 20 MG PO TABS: ORAL_TABLET | ORAL | Status: DC

## 2014-01-01 NOTE — Progress Notes (Signed)
Pre visit review using our clinic review tool, if applicable. No additional management support is needed unless otherwise documented below in the visit note. 
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
## 2014-01-01 NOTE — Patient Instructions (Signed)
Lab today for uric acid and a sed rate (for possible PMR) Continue current medicines  Take prednisone as directed  Take care of yourself  I will get lab results to Dr Darnell Level and make a plan

## 2014-01-03 NOTE — Assessment & Plan Note (Signed)
Some bilateral shoulder girdle pain - no rotator cuff signs  No erythema/warmth of joints Disc poss of PMR Sed rate today tx with prednisone short term as well

## 2014-01-03 NOTE — Assessment & Plan Note (Signed)
Hx of gout No red/hot joints today  On allopurinol - but thinks uric acid is high after eating badly Check level today  Enc f/u with PCP

## 2014-01-03 NOTE — Assessment & Plan Note (Signed)
Suspect sciatica based on exam  Unsure if related to his upper body pain  tx with pred taper  If no imp would consider films and addn exam  Disc imp of walking

## 2014-01-30 ENCOUNTER — Ambulatory Visit (INDEPENDENT_AMBULATORY_CARE_PROVIDER_SITE_OTHER): Payer: 59 | Admitting: Family Medicine

## 2014-01-30 ENCOUNTER — Encounter: Payer: Self-pay | Admitting: Family Medicine

## 2014-01-30 VITALS — BP 160/84 | HR 72 | Temp 98.2°F | Wt 287.0 lb

## 2014-01-30 DIAGNOSIS — M1A00X0 Idiopathic chronic gout, unspecified site, without tophus (tophi): Secondary | ICD-10-CM

## 2014-01-30 DIAGNOSIS — M79629 Pain in unspecified upper arm: Secondary | ICD-10-CM

## 2014-01-30 DIAGNOSIS — M353 Polymyalgia rheumatica: Secondary | ICD-10-CM | POA: Insufficient documentation

## 2014-01-30 DIAGNOSIS — E785 Hyperlipidemia, unspecified: Secondary | ICD-10-CM

## 2014-01-30 DIAGNOSIS — I10 Essential (primary) hypertension: Secondary | ICD-10-CM

## 2014-01-30 DIAGNOSIS — E669 Obesity, unspecified: Secondary | ICD-10-CM

## 2014-01-30 DIAGNOSIS — E039 Hypothyroidism, unspecified: Secondary | ICD-10-CM

## 2014-01-30 DIAGNOSIS — M25529 Pain in unspecified elbow: Secondary | ICD-10-CM

## 2014-01-30 DIAGNOSIS — M79609 Pain in unspecified limb: Secondary | ICD-10-CM

## 2014-01-30 HISTORY — DX: Polymyalgia rheumatica: M35.3

## 2014-01-30 LAB — CBC WITH DIFFERENTIAL/PLATELET
BASOS ABS: 0 10*3/uL (ref 0.0–0.1)
Basophils Relative: 0.5 % (ref 0.0–3.0)
EOS ABS: 0.2 10*3/uL (ref 0.0–0.7)
Eosinophils Relative: 3 % (ref 0.0–5.0)
HCT: 39.5 % (ref 39.0–52.0)
HEMOGLOBIN: 13.6 g/dL (ref 13.0–17.0)
LYMPHS ABS: 1.4 10*3/uL (ref 0.7–4.0)
LYMPHS PCT: 23.3 % (ref 12.0–46.0)
MCHC: 34.4 g/dL (ref 30.0–36.0)
MCV: 91.4 fl (ref 78.0–100.0)
MONO ABS: 0.4 10*3/uL (ref 0.1–1.0)
Monocytes Relative: 7 % (ref 3.0–12.0)
Neutro Abs: 3.9 10*3/uL (ref 1.4–7.7)
Neutrophils Relative %: 66.2 % (ref 43.0–77.0)
Platelets: 270 10*3/uL (ref 150.0–400.0)
RBC: 4.32 Mil/uL (ref 4.22–5.81)
RDW: 12.9 % (ref 11.5–15.5)
WBC: 5.9 10*3/uL (ref 4.0–10.5)

## 2014-01-30 LAB — CK: CK TOTAL: 133 U/L (ref 7–232)

## 2014-01-30 LAB — HEPATIC FUNCTION PANEL
ALT: 35 U/L (ref 0–53)
AST: 28 U/L (ref 0–37)
Albumin: 4.6 g/dL (ref 3.5–5.2)
Alkaline Phosphatase: 65 U/L (ref 39–117)
BILIRUBIN TOTAL: 0.6 mg/dL (ref 0.2–1.2)
Bilirubin, Direct: 0.1 mg/dL (ref 0.0–0.3)
TOTAL PROTEIN: 7.8 g/dL (ref 6.0–8.3)

## 2014-01-30 MED ORDER — CYCLOBENZAPRINE HCL 10 MG PO TABS: 5.0000 mg | ORAL_TABLET | Freq: Two times a day (BID) | ORAL | Status: DC | PRN

## 2014-01-30 NOTE — Assessment & Plan Note (Signed)
Elevated today attributed to pt being off amlodipine for last 2 weeks. Will restart today.

## 2014-01-30 NOTE — Assessment & Plan Note (Addendum)
Off statin for last 2 weeks - trying to get all meds on same schedule for pickup at pharmacy given his extensive traveling for work. This points against statin induced myopathy. Regardless, check CPK and LFTs today. Pt to restart simvastatin 20mg  today.

## 2014-01-30 NOTE — Progress Notes (Signed)
Pre visit review using our clinic review tool, if applicable. No additional management support is needed unless otherwise documented below in the visit note. 

## 2014-01-30 NOTE — Assessment & Plan Note (Signed)
Off levothyroxine for last 2 weeks - will restart today, start at 105mcg daily for 1 wk then increase to full dose 152mcg daily.

## 2014-01-30 NOTE — Assessment & Plan Note (Signed)
Bilateral upper and lower limb girdle pain. Not right age for PMR and ESR was 29 (normal range up to 22). Unclear etiology. No pain at SIJ points against ankylosing spondylitis. ?other spondyloarthropathy. Significant morning stiffness with myositis sxs. Pain present even when compliant with meds. Check Lyme disease titer, RF, HLAB27, CPK, aldolase, LFTs and CBC. Trial of flexeril nightly for sleep. Refer to rheum for further evaluation/management. Pt agrees with plan.

## 2014-01-30 NOTE — Patient Instructions (Addendum)
Let's check blood work again and refer you to rheumatologist for further evaluation of these joint/muscle pains. Restart meds - start allopurinol and levothyroxine with tapers. Try flexeril for muscle pain mainly at night time as it can cause sedation.

## 2014-01-30 NOTE — Assessment & Plan Note (Signed)
Will slowly restart allopurinol to goal 450mg  daily. Also takes colchicine 1.2mg  daily - states if he decreases this he consistently will have a gout flare.

## 2014-01-30 NOTE — Assessment & Plan Note (Signed)
Pain limits ability to exercise - encouraged regular walking.

## 2014-01-30 NOTE — Progress Notes (Addendum)
BP 160/84 mmHg  Pulse 72  Temp(Src) 98.2 F (36.8 C) (Oral)  Wt 287 lb (130.182 kg)   CC: 1 mo f/u visit  Subjective:    Patient ID: Robert Shea, male    DOB: 01/16/1955, 59 y.o.   MRN: [id number]  HPI: Robert Shea is a 59 y.o. male presenting on 01/30/2014 for Follow-up   Seen by Dr Glori Bickers on 01/01/2014 with several day h/o low back pain suspicious for R sided sciatica and bilateral shoulder girdle pain without RTC sxs and minimally elevated ESR pointing against PMR, treated with prednisone taper.   Presents today for 1 mo f/u. sxs have not improved at all. 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 Worse pain in am. As day progresses, sxs improve then sxs recur at night time. This doesn't feel like gout. Has read about PMR and sxs seem to correlate. Also asks about muscle aches. Describes muscle ache "like after a long workout". This started early December. Prednisone significantly helped girdle pain but not sciatica pain, then sxs recur once off prednisone. Worse pain with lifting arms above head or at night when he's rolling over in bed, takes him 15 min to put on shoes.  Some weakness of grip strength noted recently, no proximal muscle weakness.  Pain from shoulders down upper arms as well as from hips down thighs/buttock. + morning stiffness and stiffness with immobility. He's stopped drinking alcohol for 1 month with no improvement. Tried changing diet to gluten free - didn't help. Hot shower helps pain as well.  Weight gain noted - unable to exercise on stationary bike 2/2 pain  Denies fevers/chills, tick bites, rashes, skin changes. No active synovitis, no hand or knee arthritis. He does spend significant time in woods hunting.  Gout - prescribed allopurinol 450mg  once daily. No recent gout flare. Previously did not tolerate uloric. Off allopurinol for last 2 weeks, will  slowly restart.  HLD - prescribed simvastatin 20mg  daily. has been on this med for several years.   Off all meds for last 2 weeks - trying to get meds on track for one day pickup per month at pharmacy. Has not taken simvastatin in 2 weeks. Above sxs started prior to stopping meds.   Relevant past medical, surgical, family and social history reviewed and updated as indicated. Interim medical history since our last visit reviewed. Allergies and medications reviewed and updated. Current Outpatient Prescriptions on File Prior to Visit  Medication Sig  . allopurinol (ZYLOPRIM) 300 MG tablet Take 1.5 tablets (450 mg total) by mouth daily.  Marland Kitchen amLODipine (NORVASC) 10 MG tablet Take 1 tablet (10 mg total) by mouth daily.  . colchicine 0.6 MG tablet Take 1 tablet (0.6 mg total) by mouth 2 (two) times daily as needed. (Patient taking differently: Take 1.2 mg by mouth daily. )  . levothyroxine (SYNTHROID, LEVOTHROID) 100 MCG tablet Take 1 tablet (100 mcg total) by mouth daily before breakfast.  . sildenafil (VIAGRA) 100 MG tablet Take 0.5-1 tablets (50-100 mg total) by mouth daily as needed for erectile dysfunction.  . simvastatin (ZOCOR) 20 MG tablet Take 1 tablet (20 mg total) by mouth daily.   No current facility-administered medications on file prior to visit.   Past Medical History  Diagnosis Date  . Gout   . Heart murmur longstanding  . HTN (hypertension) 2010  . HLD (hyperlipidemia)   .  History of colon polyps 2010    benign  . Hypothyroidism 2010  . Ex-smoker 2009    quit  . Obesity     Past Surgical History  Procedure Laterality Date  . Vasectomy  1995  . Colonoscopy  07/2009    2 adenomas rpt 5 yrs Collie Siad)  . Colonoscopy  05/2013    1 hyperplastic polyp, rpt 10 yrs Ardis Hughs)    History   Social History  . Marital Status: Married    Spouse Name: N/A    Number of Children: N/A  . Years of Education: N/A   Occupational History  . Not on file.   Social History Main Topics  .  Smoking status: Former Smoker    Quit date: 01/06/2007  . Smokeless tobacco: Never Used  . Alcohol Use: Yes     Comment: weekends-8 per weekend  . Drug Use: No  . Sexual Activity: Not on file   Other Topics Concern  . Not on file   Social History Narrative   Lives with wife   [place] part time local   Occ: VP of sales   Edu: BS   Activity: gym 3x/wk    Diet: healthier recently - good water, fruits/vegetables daily    Family History  Problem Relation Age of Onset  . Cancer Father 40    bone?  . Cancer Maternal Uncle     throat, smoker  . Throat cancer Maternal Uncle   . Diabetes Brother   . Diabetes Mother   . CAD Paternal Grandfather     MI  . Stroke Paternal Grandfather   . Hypertension Brother   . Colon cancer Neg Hx   . Pancreatic cancer Neg Hx   . Stomach cancer Neg Hx   . Rectal cancer Neg Hx    Review of Systems Per HPI unless specifically indicated above     Objective:    BP 160/84 mmHg  Pulse 72  Temp(Src) 98.2 F (36.8 C) (Oral)  Wt 287 lb (130.182 kg)  Wt Readings from Last 3 Encounters:  01/30/14 287 lb (130.182 kg)  01/01/14 284 lb 4 oz (128.935 kg)  09/19/13 278 lb (126.1 kg)    Physical Exam  Constitutional: He is oriented to person, place, and time. He appears well-developed and well-nourished. No distress.  HENT:  Mouth/Throat: Oropharynx is clear and moist. No oropharyngeal exudate.  Neck: No thyromegaly present.  Musculoskeletal: He exhibits no edema.  FROM at neck without pain, Limited ROM at bilateral shoulders 2/2 pain - unable to abduct past 90 degrees bilaterally Tender to palpation anterior shoulders inferior to [facility] joint, no pain at shoulder bursa, no pain at elbows No pain midline lower back, no pain with SLR or with int/ext rotation of hip  Tender at lateral hips with FABER bilaterally. No pain at SIJ, GTB or sciatic notch bilaterally. No active synovitis evident.  Neurological: He is alert and oriented  to person, place, and time.  5/5 strength BUE and BLE Grip strength intact  Skin: Skin is warm and dry. No rash noted.  Psychiatric: He has a normal mood and affect.  Nursing note and vitals reviewed.  Results for orders placed or performed in visit on 01/01/14  Uric acid  Result Value Ref Range   Uric Acid, Serum 6.7 4.0 - 7.8 mg/dL  Sedimentation Rate  Result Value Ref Range   Sed Rate 29 (H) 0 - 22 mm/hr      Assessment &  Plan:   Problem List Items Addressed This Visit    Pain in limb - Primary    Bilateral upper and lower limb girdle pain. Not right age for PMR and ESR was 29 (normal range up to 22). Unclear etiology. No pain at SIJ points against ankylosing spondylitis. ?other spondyloarthropathy. Significant morning stiffness with myositis sxs. Pain present even when compliant with meds. Check Lyme disease titer, RF, HLAB27, CPK, aldolase, LFTs and CBC. Trial of flexeril nightly for sleep. Refer to rheum for further evaluation/management. Pt agrees with plan.      Relevant Orders   HLA-B27 antigen   Rheumatoid factor   CK   Aldolase   B. burgdorfi antibodies   Hepatic function panel   CBC with Differential/Platelet   Ambulatory referral to Rheumatology   Pain in joint, upper arm   Relevant Orders   Ambulatory referral to Rheumatology   Obesity    Pain limits ability to exercise - encouraged regular walking.      Hypothyroidism    Off levothyroxine for last 2 weeks - will restart today, start at 85mcg daily for 1 wk then increase to full dose 165mcg daily.      HTN (hypertension)    Elevated today attributed to pt being off amlodipine for last 2 weeks. Will restart today.      HLD (hyperlipidemia)    Off statin for last 2 weeks - trying to get all meds on same schedule for pickup at pharmacy given his extensive traveling for work. This points against statin induced myopathy. Regardless, check CPK and LFTs today. Pt to restart simvastatin 20mg  today.        Gout    Will slowly restart allopurinol to goal 450mg  daily. Also takes colchicine 1.2mg  daily - states if he decreases this he consistently will have a gout flare.      Relevant Orders   Ambulatory referral to Rheumatology       Follow up plan: Return as needed.

## 2014-01-31 ENCOUNTER — Other Ambulatory Visit: Payer: Self-pay | Admitting: *Deleted

## 2014-01-31 ENCOUNTER — Telehealth: Payer: Self-pay | Admitting: Family Medicine

## 2014-01-31 ENCOUNTER — Telehealth: Payer: Self-pay | Admitting: *Deleted

## 2014-01-31 LAB — RHEUMATOID FACTOR: Rhuematoid fact SerPl-aCnc: 65 IU/mL — ABNORMAL HIGH (ref <=14)

## 2014-01-31 LAB — HLA-B27 ANTIGEN: DNA Result:: NOT DETECTED

## 2014-01-31 LAB — B. BURGDORFI ANTIBODIES: B burgdorferi Ab IgG+IgM: 0.82 {ISR}

## 2014-01-31 MED ORDER — COLCHICINE 0.6 MG PO TABS: 0.6000 mg | ORAL_TABLET | Freq: Two times a day (BID) | ORAL | Status: DC | PRN

## 2014-01-31 NOTE — Telephone Encounter (Signed)
Pt needs rx called into pharmacy, said it was supposed to have been called in yesterday.  Pt is travelling out of town this evening and would like it called in before then.

## 2014-01-31 NOTE — Telephone Encounter (Signed)
Spoke with patient and Colcrys refilled but advised that it was requiring PA, which Dr. Darnell Level has now to complete. Patient verbalized understanding and said he will await approval.

## 2014-01-31 NOTE — Telephone Encounter (Signed)
PA for Colcrys in your IN box for completion.

## 2014-02-01 NOTE — Telephone Encounter (Signed)
Filled and in Kim's box. 

## 2014-02-01 NOTE — Telephone Encounter (Signed)
Form faxed. Will await determination. 

## 2014-02-02 ENCOUNTER — Telehealth: Payer: Self-pay | Admitting: Family Medicine

## 2014-02-02 LAB — ALDOLASE: Aldolase: 6.4 U/L (ref <=8.1)

## 2014-02-02 MED ORDER — COLCHICINE 0.6 MG PO CAPS: 2.0000 | ORAL_CAPSULE | Freq: Every day | ORAL | Status: DC

## 2014-02-02 NOTE — Telephone Encounter (Signed)
Spoke with patient.

## 2014-02-02 NOTE — Telephone Encounter (Signed)
Sent in generic colchicine, ok for mitigare if needed.

## 2014-02-02 NOTE — Telephone Encounter (Signed)
Patient called to get his lab results and he said he wanted an update on the medication he called to get a refill on because he said the pharmacy said it need an authorization.  Patient is asking for you to call him back a.s.a.p..

## 2014-02-02 NOTE — Telephone Encounter (Signed)
Returned pt's call, explained that Rosaria Ferries had checked with 3 Rheumatology offices this pm and they were all closed.  She did however fax records to Dr. Estanislado Pandy for review on 02/05/14.  Instructed pt to call me on 02/05/14 pm if he had not gotten a call from Dr. Arlean Hopping office and I would call them to check on his urgent referral. Pt will call me if needed / lt

## 2014-02-02 NOTE — Telephone Encounter (Signed)
i did not call patient. Let me know if I need to.

## 2014-02-02 NOTE — Addendum Note (Signed)
Addended by: Ria Bush on: 02/02/2014 03:57 PM   Modules accepted: Orders, Medications

## 2014-02-02 NOTE — Telephone Encounter (Signed)
Called patient for the Rheumatology referral, after looking at referral realized we had made him an appt at [facility] with Dr Trudie Reed on 10/23/13. He started talking about [place] referring him for a Colonoscopy that cost him $3500 because it was not coded properly and I tried to figure out why and he was only more agitated. I asked him if he went to the Rheum appt that we had made for him in Oct so I could call the same office and he then got more agitated and asked me to have Dr Danise Mina call him. Turns out that he has a [facility] plan that requires formal online Referrals wherever he goes to see a Specialist. I called every local Rheumatologist office today and they all were closed except Dr Anette Guarneri office where she is scheduling New patients at the end of March and she was not in the office today to review our records. I have faxed the records to Dr Anette Guarneri office anyway for her to review for an URGENT CONSULT on Monday and will followup with a phone call. I will call the rest of the offices on Monday to see who can see him the soonest and who takes his [employer].

## 2014-02-02 NOTE — Telephone Encounter (Signed)
Patient notified and verbalized understanding. 

## 2014-02-02 NOTE — Telephone Encounter (Signed)
PA denied. Requires failure of Mitigare. In your IN box for review.

## 2014-02-05 ENCOUNTER — Telehealth: Payer: Self-pay | Admitting: Family Medicine

## 2014-02-05 MED ORDER — TRAMADOL HCL 50 MG PO TABS: 50.0000 mg | ORAL_TABLET | Freq: Three times a day (TID) | ORAL | Status: DC | PRN

## 2014-02-05 NOTE — Telephone Encounter (Signed)
Patient called and insisted on talking to [facility] about his prescription.  Please call patient.

## 2014-02-05 NOTE — Addendum Note (Signed)
Addended by: Ria Bush on: 02/05/2014 02:02 PM   Modules accepted: Orders

## 2014-02-05 NOTE — Telephone Encounter (Signed)
Patient left a voicemail stating that the muscle relaxer is not helping at all. Patient request a pain medication because he is having difficulty walking and standing.  [facility]

## 2014-02-05 NOTE — Telephone Encounter (Signed)
Spoke with patient and he has not tried tramadol that he can recall. Advised that I would send it in and that if he found it wasn't helping, that he would have to come and pick up a written script for anything any stronger. He verbalized understanding. Rx called in as directed.

## 2014-02-05 NOTE — Telephone Encounter (Signed)
Has he tried tramadol in the past? May phone in.

## 2014-02-06 NOTE — Telephone Encounter (Signed)
Pt left v/m requesting cb about generic colchicine; the generic med requires prior auth as well.

## 2014-02-07 NOTE — Telephone Encounter (Addendum)
Spoke with Danae Chen at [facility] and PA completed for generic colchicine. Asked for urgent review [phone]  [phone]

## 2014-02-08 NOTE — Telephone Encounter (Signed)
Generic colchicine denied and name brand Mitigare must be tried and failed. Spoke with pharmacy and they had talked with insurance and insurance advised they required [place]. Pharmacy was having to order [place], so they gave him enough colchicine to get him through until shipment arrived. Spoke with patient and he was very appreciative of all we have been doing.

## 2014-02-11 ENCOUNTER — Other Ambulatory Visit: Payer: Self-pay | Admitting: Family Medicine

## 2014-02-13 ENCOUNTER — Ambulatory Visit: Payer: 59 | Admitting: Family Medicine

## 2014-02-20 ENCOUNTER — Encounter: Payer: Self-pay | Admitting: Family Medicine

## 2014-02-20 ENCOUNTER — Ambulatory Visit (INDEPENDENT_AMBULATORY_CARE_PROVIDER_SITE_OTHER): Payer: 59 | Admitting: Family Medicine

## 2014-02-20 VITALS — BP 154/80 | HR 80 | Temp 98.0°F | Wt 283.8 lb

## 2014-02-20 DIAGNOSIS — M353 Polymyalgia rheumatica: Secondary | ICD-10-CM

## 2014-02-20 NOTE — Assessment & Plan Note (Signed)
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 Started on low dose prednisone and noticed marked improvement within a few hours. Plans to continue $RemoveBef'15mg'[id number]$  daily and has f/u with Dr Estanislado Pandy in 3 months. I will await note from Dr Estanislado Pandy to review. ?discussion on MTX, I anticipate from possible rheumatologic concern. However he is doing very well on current prednisone dose, will continue for now. Discussed PMR, handout provided.  Discussed prednisone, side effects and adverse events associated with prolonged use. Already on multivitamin daily, advised check amt vit D in MVI, rec increase dietary calcium intake, watch carbs and appetite while on prednisone. F/u with me after next rheum appt. Pt also asks I review imaging obtained at rheum appt.

## 2014-02-20 NOTE — Progress Notes (Signed)
BP 154/80 mmHg  Pulse 80  Temp(Src) 98 F (36.7 C) (Oral)  Wt 283 lb 12 oz (128.708 kg)   CC: f/u rheum referral  Subjective:    Patient ID: Robert Shea, male    DOB: 07-06-1955, 59 y.o.   MRN: [id number]  HPI: Robert Shea is a 59 y.o. male presenting on 02/20/2014 for Follow-up   Recent rheum evaluation for diffuse myalgias, arthralgias and stiffness in limb girdle with ESR 29. Thought related to PMR - started on prednisone and noticed significant improvement a few hours after first dose. Ibuprofen, tramadol didn't help at all. Was suggested to possibly take MTX. Planned f/u April or May.   He had xrays done at [facility] as well.  Relevant past medical, surgical, family and social history reviewed and updated as indicated. Interim medical history since our last visit reviewed. Allergies and medications reviewed and updated. Current Outpatient Prescriptions on File Prior to Visit  Medication Sig  . allopurinol (ZYLOPRIM) 300 MG tablet Take 1.5 tablets (450 mg total) by mouth daily.  Marland Kitchen amLODipine (NORVASC) 10 MG tablet Take 1 tablet (10 mg total) by mouth daily.  . Colchicine (MITIGARE) 0.6 MG CAPS Take 2 tablets by mouth daily.  Marland Kitchen levothyroxine (SYNTHROID, LEVOTHROID) 100 MCG tablet Take 1 tablet (100 mcg total) by mouth daily before breakfast.  . sildenafil (VIAGRA) 100 MG tablet Take 0.5-1 tablets (50-100 mg total) by mouth daily as needed for erectile dysfunction.  . simvastatin (ZOCOR) 20 MG tablet Take 1 tablet (20 mg total) by mouth daily.   No current facility-administered medications on file prior to visit.    Review of Systems Per HPI unless specifically indicated above     Objective:    BP 154/80 mmHg  Pulse 80  Temp(Src) 98 F (36.7 C) (Oral)  Wt 283 lb 12 oz (128.708 kg)  Wt Readings from Last 3 Encounters:  02/20/14 283 lb 12 oz (128.708 kg)  01/30/14 287 lb (130.182 kg)  01/01/14 284 lb 4 oz (128.935 kg)    Physical Exam  Constitutional: He  appears well-developed and well-nourished. No distress.  HENT:  Mouth/Throat: Oropharynx is clear and moist. No oropharyngeal exudate.  Eyes: Conjunctivae and EOM are normal. Pupils are equal, round, and reactive to light.  Cardiovascular: Normal rate, regular rhythm, normal heart sounds and intact distal pulses.   No murmur heard. Pulmonary/Chest: Effort normal and breath sounds normal. No respiratory distress. He has no wheezes. He has no rales.  Musculoskeletal: He exhibits no edema.  No pain to palpation bilateral upper extremities  Nursing note and vitals reviewed.  Results for orders placed or performed in visit on 01/30/14  HLA-B27 antigen  Result Value Ref Range   DNA Result: Not Detected   Rheumatoid factor  Result Value Ref Range   Rhuematoid fact SerPl-aCnc 65 (H) <=14 IU/mL  CK  Result Value Ref Range   Total CK 133 7 - 232 U/L  Aldolase  Result Value Ref Range   Aldolase 6.4 <=8.1 U/L  B. burgdorfi antibodies  Result Value Ref Range   B burgdorferi Ab IgG+IgM 0.82 ISR  Hepatic function panel  Result Value Ref Range   Total Bilirubin 0.6 0.2 - 1.2 mg/dL   Bilirubin, Direct 0.1 0.0 - 0.3 mg/dL   Alkaline Phosphatase 65 39 - 117 U/L   AST 28 0 - 37 U/L   ALT 35 0 - 53 U/L   Total Protein 7.8 6.0 - 8.3 g/dL  Albumin 4.6 3.5 - 5.2 g/dL  CBC with Differential/Platelet  Result Value Ref Range   WBC 5.9 4.0 - 10.5 K/uL   RBC 4.32 4.22 - 5.81 Mil/uL   Hemoglobin 13.6 13.0 - 17.0 g/dL   HCT 39.5 39.0 - 52.0 %   MCV 91.4 78.0 - 100.0 fl   MCHC 34.4 30.0 - 36.0 g/dL   RDW 12.9 11.5 - 15.5 %   Platelets 270.0 150.0 - 400.0 K/uL   Neutrophils Relative % 66.2 43.0 - 77.0 %   Lymphocytes Relative 23.3 12.0 - 46.0 %   Monocytes Relative 7.0 3.0 - 12.0 %   Eosinophils Relative 3.0 0.0 - 5.0 %   Basophils Relative 0.5 0.0 - 3.0 %   Neutro Abs 3.9 1.4 - 7.7 K/uL   Lymphs Abs 1.4 0.7 - 4.0 K/uL   Monocytes Absolute 0.4 0.1 - 1.0 K/uL   Eosinophils Absolute 0.2 0.0 - 0.7  K/uL   Basophils Absolute 0.0 0.0 - 0.1 K/uL   Lab Results  Component Value Date   ESRSEDRATE 29* 01/01/2014      Assessment & Plan:  A total of 25 minutes were spent face-to-face with the patient during this encounter and over half of that time was spent on counseling and coordination of care  Problem List Items Addressed This Visit    PMR (polymyalgia rheumatica) - Primary    Recent rheum eval consistent with PMR with ESR 29. Started on low dose prednisone and noticed marked improvement within a few hours. Plans to continue 16m daily and has f/u with Dr DEstanislado Pandyin 3 months. I will await note from Dr DEstanislado Pandyto review. ?discussion on MTX, I anticipate from possible rheumatologic concern. However he is doing very well on current prednisone dose, will continue for now. Discussed PMR, handout provided.  Discussed prednisone, side effects and adverse events associated with prolonged use. Already on multivitamin daily, advised check amt vit D in MVI, rec increase dietary calcium intake, watch carbs and appetite while on prednisone. F/u with me after next rheum appt. Pt also asks I review imaging obtained at rheum appt.          Follow up plan: Return in about 3 months (around 05/21/2014), or as needed, for follow up visit.

## 2014-02-20 NOTE — Progress Notes (Signed)
Pre visit review using our clinic review tool, if applicable. No additional management support is needed unless otherwise documented below in the visit note. 

## 2014-02-20 NOTE — Patient Instructions (Addendum)
Handout on PMR provided today.  Continue prednisone 15mg  daily. Keep follow up appointment with Dr Estanislado Pandy and return to see me in 2-3 months after your next appointment with her.  Watch carb intake while on prednisone. Continue multivitamin.  Return to see me after next appointment with rheumatology.

## 2014-02-24 ENCOUNTER — Other Ambulatory Visit: Payer: Self-pay | Admitting: Family Medicine

## 2014-03-15 ENCOUNTER — Telehealth: Payer: Self-pay | Admitting: Family Medicine

## 2014-03-15 DIAGNOSIS — M353 Polymyalgia rheumatica: Secondary | ICD-10-CM

## 2014-03-15 NOTE — Telephone Encounter (Signed)
Message left advising patient to come in on a day that is convenient for him.

## 2014-03-15 NOTE — Telephone Encounter (Signed)
Pt dropped off order from [place].  He stated dr g would know what this was about Put in dr g in box

## 2014-03-15 NOTE — Telephone Encounter (Signed)
Noted request for CXR for baseline immunosuppressive therapy by Dr Estanislado Pandy.  Will order for baseline and then fax report to Dr Estanislado Pandy.  plz have him come in for xray.

## 2014-04-02 ENCOUNTER — Telehealth: Payer: Self-pay

## 2014-04-02 NOTE — Telephone Encounter (Signed)
Pt left v/m; pt said he received call from [facility] about CXR; pt thinks labs and pneumonia shot are supposed to be scheduled as well. Pt thinks BS is running high and request cb from Kim to ck on labs as well as pneumonia vaccine.

## 2014-04-03 NOTE — Telephone Encounter (Signed)
Spoke with patient and scheduled appt. Advised if his sugar was running high, then he needed to see the doctor and that we didn't have an order for labs, only for the x-ray. So, he would need to come in to see Dr. Darnell Level to discuss what was needed.

## 2014-04-17 ENCOUNTER — Encounter: Payer: Self-pay | Admitting: Family Medicine

## 2014-04-17 ENCOUNTER — Ambulatory Visit (INDEPENDENT_AMBULATORY_CARE_PROVIDER_SITE_OTHER): Payer: 59 | Admitting: Family Medicine

## 2014-04-17 VITALS — BP 132/70 | HR 67 | Temp 98.1°F | Resp 16 | Ht 75.0 in | Wt 277.1 lb

## 2014-04-17 DIAGNOSIS — E039 Hypothyroidism, unspecified: Secondary | ICD-10-CM | POA: Diagnosis not present

## 2014-04-17 DIAGNOSIS — R739 Hyperglycemia, unspecified: Secondary | ICD-10-CM

## 2014-04-17 DIAGNOSIS — E785 Hyperlipidemia, unspecified: Secondary | ICD-10-CM

## 2014-04-17 DIAGNOSIS — S50852A Superficial foreign body of left forearm, initial encounter: Secondary | ICD-10-CM | POA: Diagnosis not present

## 2014-04-17 DIAGNOSIS — Z23 Encounter for immunization: Secondary | ICD-10-CM | POA: Diagnosis not present

## 2014-04-17 DIAGNOSIS — M353 Polymyalgia rheumatica: Secondary | ICD-10-CM | POA: Diagnosis not present

## 2014-04-17 LAB — BASIC METABOLIC PANEL
BUN: 20 mg/dL (ref 6–23)
CALCIUM: 10.2 mg/dL (ref 8.4–10.5)
CO2: 28 mEq/L (ref 19–32)
CREATININE: 1.09 mg/dL (ref 0.40–1.50)
Chloride: 102 mEq/L (ref 96–112)
GFR: 73.88 mL/min (ref >60.00)
Glucose, Bld: 153 mg/dL — ABNORMAL HIGH (ref 70–99)
Potassium: 4.3 mEq/L (ref 3.5–5.1)
Sodium: 139 mEq/L (ref 135–145)

## 2014-04-17 LAB — CBC WITH DIFFERENTIAL/PLATELET
BASOS PCT: 0 % (ref 0.0–3.0)
Basophils Absolute: 0 10*3/uL (ref 0.0–0.1)
EOS PCT: 0.3 % (ref 0.0–5.0)
Eosinophils Absolute: 0 10*3/uL (ref 0.0–0.7)
HEMATOCRIT: 39.7 % (ref 39.0–52.0)
Hemoglobin: 13.4 g/dL (ref 13.0–17.0)
LYMPHS ABS: 1.2 10*3/uL (ref 0.7–4.0)
Lymphocytes Relative: 9.5 % — ABNORMAL LOW (ref 12.0–46.0)
MCHC: 33.7 g/dL (ref 30.0–36.0)
MCV: 92.2 fl (ref 78.0–100.0)
MONOS PCT: 1.9 % — AB (ref 3.0–12.0)
Monocytes Absolute: 0.2 10*3/uL (ref 0.1–1.0)
NEUTROS ABS: 10.7 10*3/uL — AB (ref 1.4–7.7)
Neutrophils Relative %: 88.3 % — ABNORMAL HIGH (ref 43.0–77.0)
Platelets: 245 10*3/uL (ref 150.0–400.0)
RBC: 4.3 Mil/uL (ref 4.22–5.81)
RDW: 14.2 % (ref 11.5–15.5)
WBC: 12.1 10*3/uL — ABNORMAL HIGH (ref 4.0–10.5)

## 2014-04-17 LAB — LIPID PANEL
Cholesterol: 169 mg/dL (ref 0–200)
HDL: 46.1 mg/dL (ref >39.00)
LDL Cholesterol: 92 mg/dL (ref 0–99)
NonHDL: 122.9
TRIGLYCERIDES: 157 mg/dL — AB (ref 0.0–149.0)
Total CHOL/HDL Ratio: 4
VLDL: 31.4 mg/dL (ref 0.0–40.0)

## 2014-04-17 LAB — HEMOGLOBIN A1C: HEMOGLOBIN A1C: 6 % (ref 4.6–6.5)

## 2014-04-17 LAB — TSH: TSH: 1.93 u[IU]/mL (ref 0.35–4.50)

## 2014-04-17 LAB — T4, FREE: FREE T4: 0.87 ng/dL (ref 0.60–1.60)

## 2014-04-17 MED ORDER — ONDANSETRON HCL 4 MG PO TABS: 4.0000 mg | ORAL_TABLET | Freq: Three times a day (TID) | ORAL | Status: DC | PRN

## 2014-04-17 NOTE — Assessment & Plan Note (Signed)
Check glu and A1c today as pt on prednisone. Discussed avoiding carbs.

## 2014-04-17 NOTE — Assessment & Plan Note (Addendum)
Currently on prednisone 15mg  daily. Requests referral to new rheumatologist, Dr Francee Piccolo, recommended by a friend. Check A1c today. Pneumovax today.

## 2014-04-17 NOTE — Assessment & Plan Note (Addendum)
Anticipate piece of cardboard in L forearm. Discussed pros/cons of removal. Will monitor for now, return if more irritated, infected, or if desires removal. Pt agrees with plan. No evidence of infection today.

## 2014-04-17 NOTE — Progress Notes (Signed)
Pre visit review using our clinic review tool, if applicable. No additional management support is needed unless otherwise documented below in the visit note. 

## 2014-04-17 NOTE — Addendum Note (Signed)
Addended by: Johnsie Cancel on: 04/17/2014 09:40 AM   Modules accepted: Orders

## 2014-04-17 NOTE — Progress Notes (Signed)
BP 132/70 mmHg  Pulse 67  Temp(Src) 98.1 F (36.7 C) (Oral)  Resp 16  Ht $R'6\' 3"'LN$  (1.905 m)  Wt 277 lb 1.9 oz (125.701 kg)  BMI 34.64 kg/m2  SpO2 98%   CC: discuss labs  Subjective:    Patient ID: Robert Shea, male    DOB: 1955-09-14, 59 y.o.   MRN: [id number]  HPI: Robert Shea is a 59 y.o. male presenting on 04/17/2014 for blood sugar follow up   PMR - on prednisone $RemoveBefor'15mg'EcJlsDmTeSoY$  daily for 6 wks. Saw Dr Estanislado Pandy. When he tried $Remove'10mg'IKJXXaY$  for 3 wks, noticed joint pains slowly start recurring. So he returned to $RemoveBef'15mg'qecszUFenA$  daily dose. Would actually like to see Dr Eda Paschal in [place].   Feels great on prednisone - walking, biking without trouble. Biked 17 miles on Sunday. Actually has lost weight.   Has been on prednisone for last 2 months. Worried about prolonged prednisone use. Notes more easy bruising. Has actually checked blood sugar at home with MIL's meter, 170s after a meal.   Bumped L forearm when putting cardboard box into trash can, bruised and cut forearm. Now bump on forearm.   Requests pneumovax today.   Relevant past medical, surgical, family and social history reviewed and updated as indicated. Interim medical history since our last visit reviewed. Allergies and medications reviewed and updated. Current Outpatient Prescriptions on File Prior to Visit  Medication Sig  . allopurinol (ZYLOPRIM) 300 MG tablet Take 1.5 tablets (450 mg total) by mouth daily.  Marland Kitchen amLODipine (NORVASC) 10 MG tablet Take 1 tablet (10 mg total) by mouth daily.  Marland Kitchen levothyroxine (SYNTHROID, LEVOTHROID) 100 MCG tablet Take 1 tablet (100 mcg total) by mouth daily before breakfast.  . predniSONE (DELTASONE) 5 MG tablet Take 15 mg by mouth daily with breakfast.  . sildenafil (VIAGRA) 100 MG tablet Take 0.5-1 tablets (50-100 mg total) by mouth daily as needed for erectile dysfunction.  . simvastatin (ZOCOR) 20 MG tablet Take 1 tablet (20 mg total) by mouth daily.  . Colchicine (MITIGARE) 0.6 MG CAPS  Take 2 tablets by mouth daily. (Patient not taking: Reported on 04/17/2014)  . cyclobenzaprine (FLEXERIL) 10 MG tablet TAKE 1/2 TO 1 TABLET BY MOUTH TWICE DAILY AS NEEDED FOR MUSCLE SPASMS (Patient not taking: Reported on 04/17/2014)   No current facility-administered medications on file prior to visit.   Past Medical History  Diagnosis Date  . Gout   . Heart murmur longstanding  . HTN (hypertension) 2010  . HLD (hyperlipidemia)   . History of colon polyps 2010    benign  . Hypothyroidism 2010  . Ex-smoker 2009    quit  . Obesity     Review of Systems Per HPI unless specifically indicated above     Objective:    BP 132/70 mmHg  Pulse 67  Temp(Src) 98.1 F (36.7 C) (Oral)  Resp 16  Ht $R'6\' 3"'lN$  (1.905 m)  Wt 277 lb 1.9 oz (125.701 kg)  BMI 34.64 kg/m2  SpO2 98%  Wt Readings from Last 3 Encounters:  04/17/14 277 lb 1.9 oz (125.701 kg)  02/20/14 283 lb 12 oz (128.708 kg)  01/30/14 287 lb (130.182 kg)    Physical Exam  Constitutional: He appears well-developed and well-nourished. No distress.  HENT:  Mouth/Throat: Oropharynx is clear and moist. No oropharyngeal exudate.  Neck: No thyromegaly present.  Cardiovascular: Normal rate, regular rhythm, normal heart sounds and intact distal pulses.   No murmur heard. Pulmonary/Chest: Effort normal and  breath sounds normal. No respiratory distress. He has no wheezes. He has no rales.  Musculoskeletal: He exhibits no edema.  Skin: Skin is warm and dry. No rash noted.  Indurated lesion L ventromedial forearm with some crepitus and mild tenderness to palpation but no erythema or drainage.  Psychiatric: He has a normal mood and affect.  Nursing note and vitals reviewed.  Results for orders placed or performed in visit on 01/30/14  HLA-B27 antigen  Result Value Ref Range   DNA Result: Not Detected   Rheumatoid factor  Result Value Ref Range   Rhuematoid fact SerPl-aCnc 65 (H) <=14 IU/mL  CK  Result Value Ref Range   Total CK 133  7 - 232 U/L  Aldolase  Result Value Ref Range   Aldolase 6.4 <=8.1 U/L  B. burgdorfi antibodies  Result Value Ref Range   B burgdorferi Ab IgG+IgM 0.82 ISR  Hepatic function panel  Result Value Ref Range   Total Bilirubin 0.6 0.2 - 1.2 mg/dL   Bilirubin, Direct 0.1 0.0 - 0.3 mg/dL   Alkaline Phosphatase 65 39 - 117 U/L   AST 28 0 - 37 U/L   ALT 35 0 - 53 U/L   Total Protein 7.8 6.0 - 8.3 g/dL   Albumin 4.6 3.5 - 5.2 g/dL  CBC with Differential/Platelet  Result Value Ref Range   WBC 5.9 4.0 - 10.5 K/uL   RBC 4.32 4.22 - 5.81 Mil/uL   Hemoglobin 13.6 13.0 - 17.0 g/dL   HCT 39.5 39.0 - 52.0 %   MCV 91.4 78.0 - 100.0 fl   MCHC 34.4 30.0 - 36.0 g/dL   RDW 12.9 11.5 - 15.5 %   Platelets 270.0 150.0 - 400.0 K/uL   Neutrophils Relative % 66.2 43.0 - 77.0 %   Lymphocytes Relative 23.3 12.0 - 46.0 %   Monocytes Relative 7.0 3.0 - 12.0 %   Eosinophils Relative 3.0 0.0 - 5.0 %   Basophils Relative 0.5 0.0 - 3.0 %   Neutro Abs 3.9 1.4 - 7.7 K/uL   Lymphs Abs 1.4 0.7 - 4.0 K/uL   Monocytes Absolute 0.4 0.1 - 1.0 K/uL   Eosinophils Absolute 0.2 0.0 - 0.7 K/uL   Basophils Absolute 0.0 0.0 - 0.1 K/uL      Assessment & Plan:   Problem List Items Addressed This Visit    PMR (polymyalgia rheumatica)    Currently on prednisone $RemoveBefor'15mg'oFWzxLTZUgoX$  daily. Requests referral to new rheumatologist, Dr Francee Piccolo, recommended by a friend. Check A1c today. Pneumovax today.      Relevant Orders   Ambulatory referral to Rheumatology   CBC with Differential/Platelet   Hypothyroidism    Check TSH today.      Relevant Orders   TSH   T4, free   Hyperglycemia    Check glu and A1c today as pt on prednisone. Discussed avoiding carbs.      Relevant Orders   Hemoglobin T0W   Basic metabolic panel   HLD (hyperlipidemia)   Relevant Orders   Lipid panel   Foreign body in left forearm - Primary    Anticipate piece of cardboard in L forearm. Discussed pros/cons of removal. Will monitor for now, return if more  irritated, infected, or if desires removal. Pt agrees with plan. No evidence of infection today.          Follow up plan: Return as needed, for follow up visit.

## 2014-04-17 NOTE — Patient Instructions (Addendum)
labwork today. Pneumonia shot today. Watch forearm - I think there is a piece of cardboard stuck in arm, but should do ok. If more bothersome or developing infection let us know or return for removal.  Pass by Marion's office for referral to rheumatology.

## 2014-04-17 NOTE — Addendum Note (Signed)
Addended by: Ria Bush on: 04/17/2014 09:56 AM   Modules accepted: Orders

## 2014-04-17 NOTE — Assessment & Plan Note (Signed)
Check TSH today

## 2014-04-19 ENCOUNTER — Encounter: Payer: Self-pay | Admitting: *Deleted

## 2014-04-23 ENCOUNTER — Telehealth: Payer: Self-pay | Admitting: Family Medicine

## 2014-04-23 NOTE — Telephone Encounter (Signed)
Opened in error

## 2014-04-25 ENCOUNTER — Encounter: Payer: Self-pay | Admitting: Family Medicine

## 2014-04-25 ENCOUNTER — Ambulatory Visit (INDEPENDENT_AMBULATORY_CARE_PROVIDER_SITE_OTHER)
Admission: RE | Admit: 2014-04-25 | Discharge: 2014-04-25 | Disposition: A | Discharge status: Home / Self Care | Payer: 59 | Source: Ambulatory Visit | Attending: Family Medicine | Admitting: Family Medicine

## 2014-04-25 ENCOUNTER — Ambulatory Visit (INDEPENDENT_AMBULATORY_CARE_PROVIDER_SITE_OTHER): Payer: 59 | Admitting: Family Medicine

## 2014-04-25 VITALS — BP 138/86 | HR 59 | Temp 98.0°F | Ht 75.0 in | Wt 277.2 lb

## 2014-04-25 DIAGNOSIS — S50852D Superficial foreign body of left forearm, subsequent encounter: Secondary | ICD-10-CM | POA: Diagnosis not present

## 2014-04-25 DIAGNOSIS — Z23 Encounter for immunization: Secondary | ICD-10-CM | POA: Diagnosis not present

## 2014-04-25 NOTE — Patient Instructions (Addendum)
Xray today Tdap today (tetanus and whooping cough shot) Treat wound with daily dressing changes with triple antibiotic or neosporin ointment. Pat dry. Watch for any spreading redness, warmth, or draining pus or any other concerns. Return on Tuesday late afternoon or Wednesday morning for suture removal.

## 2014-04-25 NOTE — Progress Notes (Signed)
BP 138/86 mmHg  Pulse 59  Temp(Src) 98 F (36.7 C) (Oral)  Ht 6\' 3"  (1.905 m)  Wt 277 lb 4 oz (125.76 kg)  BMI 34.65 kg/m2   CC: check arm  Subjective:    Patient ID: Robert Shea, male    DOB: 1955-02-25, 59 y.o.   MRN: [id number]  HPI: Robert Shea is a 59 y.o. male presenting on 04/25/2014 for Arm Pain   See prior note for details. Presumed foreign object into L forearm 1 month ago. From last visit: Anticipate piece of cardboard in L forearm. Discussed pros/cons of removal. Will monitor for now, return if more irritated, infected, or if desires removal. Pt agrees with plan. No evidence of infection today.  Pain worsening. Feels like needle sticking into skin, may be moving. Bruising returning.  No fevers/erythema.  Relevant past medical, surgical, family and social history reviewed and updated as indicated. Interim medical history since our last visit reviewed. Allergies and medications reviewed and updated. Current Outpatient Prescriptions on File Prior to Visit  Medication Sig  . allopurinol (ZYLOPRIM) 300 MG tablet Take 1.5 tablets (450 mg total) by mouth daily.  Marland Kitchen amLODipine (NORVASC) 10 MG tablet Take 1 tablet (10 mg total) by mouth daily.  . Colchicine (MITIGARE) 0.6 MG CAPS Take 2 tablets by mouth daily.  Marland Kitchen levothyroxine (SYNTHROID, LEVOTHROID) 100 MCG tablet Take 1 tablet (100 mcg total) by mouth daily before breakfast.  . predniSONE (DELTASONE) 5 MG tablet Take 15 mg by mouth daily with breakfast.  . sildenafil (VIAGRA) 100 MG tablet Take 0.5-1 tablets (50-100 mg total) by mouth daily as needed for erectile dysfunction.  . simvastatin (ZOCOR) 20 MG tablet Take 1 tablet (20 mg total) by mouth daily.   No current facility-administered medications on file prior to visit.    Review of Systems Per HPI unless specifically indicated above     Objective:    BP 138/86 mmHg  Pulse 59  Temp(Src) 98 F (36.7 C) (Oral)  Ht 6\' 3"  (1.905 m)  Wt 277 lb 4 oz  (125.76 kg)  BMI 34.65 kg/m2  Wt Readings from Last 3 Encounters:  04/25/14 277 lb 4 oz (125.76 kg)  04/17/14 277 lb 1.9 oz (125.701 kg)  02/20/14 283 lb 12 oz (128.708 kg)    Physical Exam  Constitutional: He appears well-developed and well-nourished. No distress.  Musculoskeletal:  Indurated lesion L ventromedial forearm with tender nodule to palpation, no erythema or drainage.   Skin: Skin is warm and dry. Bruising noted. No rash noted.     Healed abrasion with adjacent small bruising at tender nodule.  Nursing note and vitals reviewed.  Wound exploration: Area cleaned with betadine then anesthetized with 3-4cc 1% lidocaine with epi. Using 10 scalpel 1cm incision made overlying distal nodule, and using needle nosed forceps area explored unsuccessfully - unable to find foreign object. Wound closed with one 3.0 ethilon stitch. After care instructions provided.     Assessment & Plan:   Problem List Items Addressed This Visit    Foreign body in left forearm - Primary    Unsuccessful exploration of wound performed today.  If decides to pursue further treatment, discussed would refer to surgery, consider US guided foreign body removal.  Red flags to seek care discussed.  Dress wound with neosporin ointment.  RTC 6-7d for suture removal.  Pt agrees with plan.       Relevant Orders   DG Forearm Left  Follow up plan: Return in about 5 days (around 04/30/2014) for follow up visit.

## 2014-04-25 NOTE — Addendum Note (Signed)
Addended by: Royann Shivers A on: 04/25/2014 01:08 PM   Modules accepted: Orders

## 2014-04-25 NOTE — Assessment & Plan Note (Signed)
Unsuccessful exploration of wound performed today.  If decides to pursue further treatment, discussed would refer to surgery, consider US guided foreign body removal.  Red flags to seek care discussed.  Dress wound with neosporin ointment.  RTC 6-7d for suture removal.  Pt agrees with plan.

## 2014-04-25 NOTE — Progress Notes (Signed)
Pre visit review using our clinic review tool, if applicable. No additional management support is needed unless otherwise documented below in the visit note. 

## 2014-05-07 ENCOUNTER — Telehealth (INDEPENDENT_AMBULATORY_CARE_PROVIDER_SITE_OTHER): Payer: 59 | Admitting: *Deleted

## 2014-05-07 DIAGNOSIS — M353 Polymyalgia rheumatica: Secondary | ICD-10-CM | POA: Diagnosis not present

## 2014-05-07 DIAGNOSIS — M79609 Pain in unspecified limb: Secondary | ICD-10-CM

## 2014-05-07 DIAGNOSIS — M1A00X0 Idiopathic chronic gout, unspecified site, without tophus (tophi): Secondary | ICD-10-CM | POA: Diagnosis not present

## 2014-05-07 DIAGNOSIS — S50852D Superficial foreign body of left forearm, subsequent encounter: Secondary | ICD-10-CM

## 2014-05-07 NOTE — Telephone Encounter (Signed)
Patient left a voicemail requesting a referral to a surgeon because of the object his arm. Patient stated that it needs to be removed.

## 2014-05-08 NOTE — Telephone Encounter (Signed)
plz notify referral placed to gen surgery. Also check with pt - he never returned for suture removal - did he remove it and did wound stay closed?

## 2014-05-08 NOTE — Telephone Encounter (Signed)
Spoke with patient. His wife is a Marine scientist and removed suture for him. Wound stayed closed and was "looking fine" per pt.

## 2014-05-19 NOTE — Telephone Encounter (Signed)
Reviewed note from Dr Estanislado Pandy - rec CMP, UA, ESR, CCP, ANA and uric acid and SPEP, immunoglobins, hepatitis panel and TB gold. I ordered CXR 03/2014 but this was never completed.  Will see what pt desires to have drawn.

## 2014-05-21 NOTE — Telephone Encounter (Signed)
Spoke with patient - he would like to have blood work done at our office prior to his next appt with Dr Estanislado Pandy next week. Labs ordered.

## 2014-05-21 NOTE — Addendum Note (Signed)
Addended by: Ria Bush on: 05/21/2014 08:02 AM   Modules accepted: Orders

## 2014-05-28 ENCOUNTER — Ambulatory Visit (INDEPENDENT_AMBULATORY_CARE_PROVIDER_SITE_OTHER)
Admission: RE | Admit: 2014-05-28 | Discharge: 2014-05-28 | Disposition: A | Discharge status: Home / Self Care | Payer: 59 | Source: Ambulatory Visit | Attending: Family Medicine | Admitting: Family Medicine

## 2014-05-28 DIAGNOSIS — M353 Polymyalgia rheumatica: Secondary | ICD-10-CM

## 2014-05-28 LAB — COMPREHENSIVE METABOLIC PANEL
ALK PHOS: 56 U/L (ref 39–117)
ALT: 34 U/L (ref 0–53)
AST: 22 U/L (ref 0–37)
Albumin: 4.8 g/dL (ref 3.5–5.2)
BUN: 17 mg/dL (ref 6–23)
CALCIUM: 9.9 mg/dL (ref 8.4–10.5)
CHLORIDE: 102 meq/L (ref 96–112)
CO2: 26 mEq/L (ref 19–32)
Creatinine, Ser: 0.86 mg/dL (ref 0.40–1.50)
GFR: 97.08 mL/min (ref >60.00)
Glucose, Bld: 132 mg/dL — ABNORMAL HIGH (ref 70–99)
Potassium: 3.7 mEq/L (ref 3.5–5.1)
SODIUM: 138 meq/L (ref 135–145)
Total Bilirubin: 0.6 mg/dL (ref 0.2–1.2)
Total Protein: 7.6 g/dL (ref 6.0–8.3)

## 2014-05-28 LAB — URIC ACID: Uric Acid, Serum: 4.8 mg/dL (ref 4.0–7.8)

## 2014-05-28 NOTE — Addendum Note (Signed)
Addended by: Royann Shivers A on: 05/28/2014 02:25 PM   Modules accepted: Orders

## 2014-05-29 ENCOUNTER — Telehealth: Payer: Self-pay | Admitting: *Deleted

## 2014-05-29 DIAGNOSIS — R768 Other specified abnormal immunological findings in serum: Secondary | ICD-10-CM

## 2014-05-29 LAB — IGG, IGA, IGM
IGA: 178 mg/dL (ref 68–379)
IgG (Immunoglobin G), Serum: 771 mg/dL (ref 650–1600)
IgM, Serum: 128 mg/dL (ref 41–251)

## 2014-05-29 LAB — HEPATITIS PANEL, ACUTE
HCV AB: NEGATIVE
Hep A IgM: REACTIVE — AB
Hep B C IgM: NONREACTIVE
Hepatitis B Surface Ag: NEGATIVE

## 2014-05-29 LAB — SEDIMENTATION RATE: SED RATE: 20 mm/h (ref 0–22)

## 2014-05-29 LAB — ANA: Anti Nuclear Antibody(ANA): NEGATIVE

## 2014-05-29 LAB — CYCLIC CITRUL PEPTIDE ANTIBODY, IGG: Cyclic Citrullin Peptide Ab: <2 U/mL (ref 0.0–5.0)

## 2014-05-29 NOTE — Telephone Encounter (Signed)
Awaiting TB testing and SPEP. If not back by tomorrow, will call pt tomorrow.

## 2014-05-29 NOTE — Telephone Encounter (Signed)
Ebony Hail with [facility] called. Hep A IgM was reactive. She has already notified the health dept. Results in chart. Others still pending. Dr. Darnell Level notified.

## 2014-05-30 LAB — PROTEIN ELECTROPHORESIS, SERUM, WITH REFLEX
ALBUMIN ELP: 4.7 g/dL (ref 3.8–4.8)
Alpha-1-Globulin: 0.3 g/dL (ref 0.2–0.3)
Alpha-2-Globulin: 0.7 g/dL (ref 0.5–0.9)
Beta 2: 0.4 g/dL (ref 0.2–0.5)
Beta Globulin: 0.5 g/dL (ref 0.4–0.6)
Gamma Globulin: 0.8 g/dL (ref 0.8–1.7)
Total Protein, Serum Electrophoresis: 7.4 g/dL (ref 6.1–8.1)

## 2014-05-30 NOTE — Telephone Encounter (Signed)
Spoke with patient re positive Hep A IgM - pt denies any acute sxs, recent travel, or suspicious food intake. Will return for hep A IgM and Ig Total tomorrow, then fax results to Dr Estanislado Pandy

## 2014-05-31 ENCOUNTER — Other Ambulatory Visit: Payer: 59

## 2014-05-31 LAB — QUANTIFERON TB GOLD ASSAY (BLOOD)
Interferon Gamma Release Assay: NEGATIVE
Mitogen value: 5 IU/mL
Quantiferon Nil Value: 0.02 IU/mL
Quantiferon Tb Ag Minus Nil Value: 0 IU/mL
TB AG VALUE: 0.02 [IU]/mL

## 2014-06-01 ENCOUNTER — Encounter: Payer: Self-pay | Admitting: *Deleted

## 2014-06-06 ENCOUNTER — Other Ambulatory Visit (INDEPENDENT_AMBULATORY_CARE_PROVIDER_SITE_OTHER): Payer: 59

## 2014-06-06 DIAGNOSIS — R894 Abnormal immunological findings in specimens from other organs, systems and tissues: Secondary | ICD-10-CM

## 2014-06-06 DIAGNOSIS — R768 Other specified abnormal immunological findings in serum: Secondary | ICD-10-CM

## 2014-06-07 ENCOUNTER — Telehealth: Payer: Self-pay | Admitting: *Deleted

## 2014-06-07 ENCOUNTER — Telehealth: Payer: Self-pay | Admitting: Family Medicine

## 2014-06-07 LAB — HEPATITIS A ANTIBODY, IGM: Hep A IgM: REACTIVE — AB

## 2014-06-07 LAB — HEPATITIS A ANTIBODY, TOTAL: Hep A Total Ab: NONREACTIVE

## 2014-06-07 NOTE — Telephone Encounter (Signed)
Noted. Will await full results.

## 2014-06-07 NOTE — Telephone Encounter (Signed)
[facility] lab called with lab results. Hep A Igm was reactive, they have already notified the health department.

## 2014-06-07 NOTE — Telephone Encounter (Signed)
Robert Shea called [facility] [phone] She is following up with a positive hep a test for pt.  Please call her back at above number thanks.

## 2014-06-08 NOTE — Telephone Encounter (Signed)
Spoke with Tammy. Qs answered

## 2014-06-08 NOTE — Telephone Encounter (Signed)
Robert Shea from [place] called to see why Dr Darnell Level decided to test pt for Hep A since pt tested positive for Hep A.Tammy request cb [phone].

## 2014-06-18 ENCOUNTER — Telehealth: Payer: Self-pay

## 2014-06-18 DIAGNOSIS — Z1283 Encounter for screening for malignant neoplasm of skin: Secondary | ICD-10-CM

## 2014-06-18 MED ORDER — IMIQUIMOD 5 % EX CREA: TOPICAL_CREAM | CUTANEOUS | Status: DC

## 2014-06-18 NOTE — Telephone Encounter (Signed)
Spoke with patient re Hep A testing.  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  Requests referral to derm for skin check. Referral placed. Discussed this may take a few months to schedule.

## 2014-06-18 NOTE — Telephone Encounter (Signed)
Pt requesting cb about lab results and referral to skin doctor to ck out weird looking areas on skin; pt only wants to speak with Dr Danise Mina when he has time.

## 2014-06-19 ENCOUNTER — Encounter: Payer: Self-pay | Admitting: Family Medicine

## 2014-07-23 ENCOUNTER — Telehealth: Payer: Self-pay | Admitting: Family Medicine

## 2014-07-23 ENCOUNTER — Other Ambulatory Visit: Payer: Self-pay | Admitting: Internal Medicine

## 2014-07-23 DIAGNOSIS — Z5181 Encounter for therapeutic drug level monitoring: Secondary | ICD-10-CM

## 2014-07-23 NOTE — Telephone Encounter (Signed)
Pt came in the office this afternoon around 4:10pm. He stated he was here for lab work for Dr. Estanislado Pandy. I explained to him that we are not a drawing site and he would need to go to an official drawing site like [facility]. I looked in his chart and there were no orders placed. He states these labs are supposed to be drawn every two weeks and it is time.  He stated Dr. Darnell Level told him he could just show up and have the labs drawn for Dr. Estanislado Pandy. I also explained that we could possibly do this if Dr. Darnell Level was agreeable. He was very aggravated and walked out.

## 2014-07-23 NOTE — Telephone Encounter (Signed)
[facility] called Dr. Estanislado Pandy and got the labs needed for the patient.  She also put the orders in for the labs so patient can have them drawn tomorrow.  Called pt to notify him that he could come in tomorrow morning.

## 2014-07-24 ENCOUNTER — Other Ambulatory Visit (INDEPENDENT_AMBULATORY_CARE_PROVIDER_SITE_OTHER): Payer: 59

## 2014-07-24 DIAGNOSIS — Z5181 Encounter for therapeutic drug level monitoring: Secondary | ICD-10-CM

## 2014-07-24 LAB — CBC WITH DIFFERENTIAL/PLATELET
BASOS ABS: 0 10*3/uL (ref 0.0–0.1)
Basophils Relative: 0.4 % (ref 0.0–3.0)
Eosinophils Absolute: 0.1 10*3/uL (ref 0.0–0.7)
Eosinophils Relative: 1.4 % (ref 0.0–5.0)
HCT: 35.9 % — ABNORMAL LOW (ref 39.0–52.0)
HEMOGLOBIN: 12.2 g/dL — AB (ref 13.0–17.0)
LYMPHS ABS: 2 10*3/uL (ref 0.7–4.0)
LYMPHS PCT: 34 % (ref 12.0–46.0)
MCHC: 34 g/dL (ref 30.0–36.0)
MCV: 93.2 fl (ref 78.0–100.0)
MONO ABS: 0.4 10*3/uL (ref 0.1–1.0)
MONOS PCT: 6.8 % (ref 3.0–12.0)
NEUTROS ABS: 3.4 10*3/uL (ref 1.4–7.7)
Neutrophils Relative %: 57.4 % (ref 43.0–77.0)
PLATELETS: 197 10*3/uL (ref 150.0–400.0)
RBC: 3.85 Mil/uL — AB (ref 4.22–5.81)
RDW: 14.3 % (ref 11.5–15.5)
WBC: 5.8 10*3/uL (ref 4.0–10.5)

## 2014-07-24 LAB — COMPREHENSIVE METABOLIC PANEL
ALBUMIN: 4.3 g/dL (ref 3.5–5.2)
ALT: 32 U/L (ref 0–53)
AST: 25 U/L (ref 0–37)
Alkaline Phosphatase: 48 U/L (ref 39–117)
BILIRUBIN TOTAL: 0.7 mg/dL (ref 0.2–1.2)
BUN: 18 mg/dL (ref 6–23)
CALCIUM: 9.2 mg/dL (ref 8.4–10.5)
CO2: 29 mEq/L (ref 19–32)
Chloride: 103 mEq/L (ref 96–112)
Creatinine, Ser: 1.03 mg/dL (ref 0.40–1.50)
GFR: 78.79 mL/min (ref >60.00)
Glucose, Bld: 117 mg/dL — ABNORMAL HIGH (ref 70–99)
Potassium: 3.9 mEq/L (ref 3.5–5.1)
SODIUM: 140 meq/L (ref 135–145)
TOTAL PROTEIN: 6.6 g/dL (ref 6.0–8.3)

## 2014-07-28 ENCOUNTER — Other Ambulatory Visit: Payer: Self-pay | Admitting: Family Medicine

## 2014-07-30 ENCOUNTER — Telehealth: Payer: Self-pay | Admitting: Family Medicine

## 2014-07-30 NOTE — Telephone Encounter (Signed)
Pt called he had labs done Tuesday and wanted to make sure lab results were sent to dr devenshire

## 2014-07-30 NOTE — Telephone Encounter (Signed)
Patient notified that per Melanie's note, labs were sent to Dr. Estanislado Pandy last week.

## 2014-08-10 ENCOUNTER — Telehealth: Payer: Self-pay | Admitting: Family Medicine

## 2014-08-10 MED ORDER — AMLODIPINE BESYLATE 10 MG PO TABS: 10.0000 mg | ORAL_TABLET | Freq: Every day | ORAL | Status: DC

## 2014-08-10 MED ORDER — SIMVASTATIN 20 MG PO TABS: 20.0000 mg | ORAL_TABLET | Freq: Every day | ORAL | Status: DC

## 2014-08-10 NOTE — Telephone Encounter (Signed)
Pt called requesting amLODipine (NORVASC) 10 MG tablet be called in. He is out of medicine. [place]. Please advise.  [facility] Phone: [phone] [address] [place], [place]  [address]

## 2014-08-10 NOTE — Telephone Encounter (Signed)
plz notify this (along with simvastatin) was sent in to [facility]. Let us know when next due for refill.

## 2014-08-10 NOTE — Telephone Encounter (Signed)
Patient advised.

## 2014-10-09 ENCOUNTER — Other Ambulatory Visit: Payer: Self-pay | Admitting: Family Medicine

## 2014-10-09 ENCOUNTER — Telehealth: Payer: Self-pay | Admitting: *Deleted

## 2014-10-09 ENCOUNTER — Other Ambulatory Visit (INDEPENDENT_AMBULATORY_CARE_PROVIDER_SITE_OTHER): Payer: 59

## 2014-10-09 DIAGNOSIS — Z79899 Other long term (current) drug therapy: Secondary | ICD-10-CM

## 2014-10-09 LAB — CBC WITH DIFFERENTIAL/PLATELET
Basophils Absolute: 0 10*3/uL (ref 0.0–0.1)
Basophils Relative: 0.4 % (ref 0.0–3.0)
EOS PCT: 0.6 % (ref 0.0–5.0)
Eosinophils Absolute: 0.1 10*3/uL (ref 0.0–0.7)
HCT: 40 % (ref 39.0–52.0)
Hemoglobin: 13.5 g/dL (ref 13.0–17.0)
LYMPHS ABS: 1.2 10*3/uL (ref 0.7–4.0)
Lymphocytes Relative: 12.6 % (ref 12.0–46.0)
MCHC: 33.8 g/dL (ref 30.0–36.0)
MCV: 96 fl (ref 78.0–100.0)
MONO ABS: 0.4 10*3/uL (ref 0.1–1.0)
MONOS PCT: 4.1 % (ref 3.0–12.0)
NEUTROS ABS: 7.8 10*3/uL — AB (ref 1.4–7.7)
Neutrophils Relative %: 82.3 % — ABNORMAL HIGH (ref 43.0–77.0)
PLATELETS: 236 10*3/uL (ref 150.0–400.0)
RBC: 4.16 Mil/uL — AB (ref 4.22–5.81)
RDW: 13.8 % (ref 11.5–15.5)
WBC: 9.5 10*3/uL (ref 4.0–10.5)

## 2014-10-09 LAB — COMPREHENSIVE METABOLIC PANEL
ALT: 28 U/L (ref 0–53)
AST: 22 U/L (ref 0–37)
Albumin: 4.8 g/dL (ref 3.5–5.2)
Alkaline Phosphatase: 55 U/L (ref 39–117)
BILIRUBIN TOTAL: 0.6 mg/dL (ref 0.2–1.2)
BUN: 13 mg/dL (ref 6–23)
CO2: 30 meq/L (ref 19–32)
Calcium: 10 mg/dL (ref 8.4–10.5)
Chloride: 102 mEq/L (ref 96–112)
Creatinine, Ser: 0.99 mg/dL (ref 0.40–1.50)
GFR: 82.42 mL/min (ref >60.00)
GLUCOSE: 149 mg/dL — AB (ref 70–99)
POTASSIUM: 4.2 meq/L (ref 3.5–5.1)
Sodium: 140 mEq/L (ref 135–145)
TOTAL PROTEIN: 7.5 g/dL (ref 6.0–8.3)

## 2014-10-09 NOTE — Telephone Encounter (Signed)
Called pt, left message to call us back to schedule a lab appointment.  We have received the standing order for labs from Dr. Arlean Hopping office and verified that our lab at [facility] will be able to draw these labs as needed because it is a standing order.

## 2014-10-09 NOTE — Telephone Encounter (Signed)
Patient came in demanding labs for Dr. Estanislado Pandy. Terri explained to him that we had no order and therefore didn't know what to draw for him. He got rude with her in the lobby and said this is the 3rd time this has happened. She again explained that if we don't have an order-there is no way to know exactly what is needed or where to send them. (From experience-they have to be sent to [facility], but there has to be a provider # on the requisition. We don't know that info and can't just draw labs without the order/Dx codes). He again got loud with her and stormed out. Forwarded to Terri to add on to this if necessary.

## 2014-10-11 ENCOUNTER — Encounter: Payer: Self-pay | Admitting: *Deleted

## 2014-10-28 ENCOUNTER — Other Ambulatory Visit: Payer: Self-pay | Admitting: Family Medicine

## 2014-11-05 ENCOUNTER — Other Ambulatory Visit: Payer: Self-pay | Admitting: Family Medicine

## 2014-12-09 ENCOUNTER — Other Ambulatory Visit: Payer: Self-pay | Admitting: Family Medicine

## 2014-12-23 ENCOUNTER — Other Ambulatory Visit: Payer: Self-pay | Admitting: Family Medicine

## 2015-02-07 ENCOUNTER — Other Ambulatory Visit (INDEPENDENT_AMBULATORY_CARE_PROVIDER_SITE_OTHER): Payer: 59

## 2015-02-07 DIAGNOSIS — Z79899 Other long term (current) drug therapy: Secondary | ICD-10-CM | POA: Diagnosis not present

## 2015-02-07 LAB — CBC WITH DIFFERENTIAL/PLATELET
BASOS PCT: 0.4 % (ref 0.0–3.0)
Basophils Absolute: 0 10*3/uL (ref 0.0–0.1)
EOS PCT: 0.9 % (ref 0.0–5.0)
Eosinophils Absolute: 0.1 10*3/uL (ref 0.0–0.7)
HEMATOCRIT: 39.6 % (ref 39.0–52.0)
HEMOGLOBIN: 13.4 g/dL (ref 13.0–17.0)
LYMPHS PCT: 15.2 % (ref 12.0–46.0)
Lymphs Abs: 1.5 10*3/uL (ref 0.7–4.0)
MCHC: 33.9 g/dL (ref 30.0–36.0)
MCV: 94.9 fl (ref 78.0–100.0)
Monocytes Absolute: 0.4 10*3/uL (ref 0.1–1.0)
Monocytes Relative: 3.7 % (ref 3.0–12.0)
Neutro Abs: 8 10*3/uL — ABNORMAL HIGH (ref 1.4–7.7)
Neutrophils Relative %: 79.8 % — ABNORMAL HIGH (ref 43.0–77.0)
Platelets: 268 10*3/uL (ref 150.0–400.0)
RBC: 4.17 Mil/uL — ABNORMAL LOW (ref 4.22–5.81)
RDW: 13.5 % (ref 11.5–15.5)
WBC: 10 10*3/uL (ref 4.0–10.5)

## 2015-02-07 LAB — COMPREHENSIVE METABOLIC PANEL
ALT: 42 U/L (ref 0–53)
AST: 28 U/L (ref 0–37)
Albumin: 5 g/dL (ref 3.5–5.2)
Alkaline Phosphatase: 69 U/L (ref 39–117)
BUN: 15 mg/dL (ref 6–23)
CALCIUM: 9.8 mg/dL (ref 8.4–10.5)
CHLORIDE: 100 meq/L (ref 96–112)
CO2: 29 mEq/L (ref 19–32)
Creatinine, Ser: 1.08 mg/dL (ref 0.40–1.50)
GFR: 74.46 mL/min (ref >60.00)
Glucose, Bld: 197 mg/dL — ABNORMAL HIGH (ref 70–99)
POTASSIUM: 4 meq/L (ref 3.5–5.1)
Sodium: 139 mEq/L (ref 135–145)
Total Bilirubin: 0.5 mg/dL (ref 0.2–1.2)
Total Protein: 7.6 g/dL (ref 6.0–8.3)

## 2015-02-11 ENCOUNTER — Encounter: Payer: Self-pay | Admitting: *Deleted

## 2015-02-17 ENCOUNTER — Other Ambulatory Visit: Payer: Self-pay | Admitting: Family Medicine

## 2015-02-25 ENCOUNTER — Telehealth: Payer: Self-pay

## 2015-02-25 ENCOUNTER — Ambulatory Visit (INDEPENDENT_AMBULATORY_CARE_PROVIDER_SITE_OTHER): Payer: 59 | Admitting: Family Medicine

## 2015-02-25 ENCOUNTER — Encounter: Payer: Self-pay | Admitting: Family Medicine

## 2015-02-25 VITALS — BP 158/84 | HR 74 | Temp 98.6°F | Ht 74.5 in | Wt 296.0 lb

## 2015-02-25 DIAGNOSIS — E785 Hyperlipidemia, unspecified: Secondary | ICD-10-CM

## 2015-02-25 DIAGNOSIS — R0609 Other forms of dyspnea: Secondary | ICD-10-CM

## 2015-02-25 DIAGNOSIS — Z0000 Encounter for general adult medical examination without abnormal findings: Secondary | ICD-10-CM

## 2015-02-25 DIAGNOSIS — Z23 Encounter for immunization: Secondary | ICD-10-CM

## 2015-02-25 DIAGNOSIS — I10 Essential (primary) hypertension: Secondary | ICD-10-CM

## 2015-02-25 DIAGNOSIS — IMO0001 Reserved for inherently not codable concepts without codable children: Secondary | ICD-10-CM

## 2015-02-25 DIAGNOSIS — M353 Polymyalgia rheumatica: Secondary | ICD-10-CM

## 2015-02-25 DIAGNOSIS — Z125 Encounter for screening for malignant neoplasm of prostate: Secondary | ICD-10-CM

## 2015-02-25 DIAGNOSIS — I6523 Occlusion and stenosis of bilateral carotid arteries: Secondary | ICD-10-CM | POA: Diagnosis not present

## 2015-02-25 DIAGNOSIS — R739 Hyperglycemia, unspecified: Secondary | ICD-10-CM

## 2015-02-25 DIAGNOSIS — E039 Hypothyroidism, unspecified: Secondary | ICD-10-CM

## 2015-02-25 DIAGNOSIS — M1A00X0 Idiopathic chronic gout, unspecified site, without tophus (tophi): Secondary | ICD-10-CM

## 2015-02-25 NOTE — Patient Instructions (Addendum)
Flu shot today Return at your convenience for fasting labs.  We will schedule ultrasound of carotids. Sign release for last office visit from Dr Bronson Curb.  Watch for recurrent shortness of breath or chest pain - if this happens let me know for referral to heart doctor. Return as needed or in 1 year for next physical.      Mediterranean Diet Why follow it? Research shows. . Those who follow the Mediterranean diet have a reduced risk of heart disease  . The diet is associated with a reduced incidence of Parkinson's and Alzheimer's diseases . People following the diet may have longer life expectancies and lower rates of chronic diseases  . The Dietary Guidelines for Americans recommends the Mediterranean diet as an eating plan to promote health and prevent disease  What Is the Mediterranean Diet?  . Healthy eating plan based on typical foods and recipes of Mediterranean-style cooking . The diet is primarily a plant based diet; these foods should make up a majority of meals   Starches - Plant based foods should make up a majority of meals - They are an important sources of vitamins, minerals, energy, antioxidants, and fiber - Choose whole grains, foods high in fiber and minimally processed items  - Typical grain sources include wheat, oats, barley, corn, brown rice, bulgar, farro, millet, polenta, couscous  - Various types of beans include chickpeas, lentils, fava beans, black beans, white beans   Fruits  Veggies - Large quantities of antioxidant rich fruits & veggies; 6 or more servings  - Vegetables can be eaten raw or lightly drizzled with oil and cooked  - Vegetables common to the traditional Mediterranean Diet include: artichokes, arugula, beets, broccoli, brussel sprouts, cabbage, carrots, celery, collard greens, cucumbers, eggplant, kale, leeks, lemons, lettuce, mushrooms, okra, onions, peas, peppers, potatoes, pumpkin, radishes, rutabaga, shallots, spinach, sweet potatoes, turnips,  zucchini - Fruits common to the Mediterranean Diet include: apples, apricots, avocados, cherries, clementines, dates, figs, grapefruits, grapes, melons, nectarines, oranges, peaches, pears, pomegranates, strawberries, tangerines  Fats - Replace butter and margarine with healthy oils, such as olive oil, canola oil, and tahini  - Limit nuts to no more than a handful a day  - Nuts include walnuts, almonds, pecans, pistachios, pine nuts  - Limit or avoid candied, honey roasted or heavily salted nuts - Olives are central to the [employer] - can be eaten whole or used in a variety of dishes   Meats Protein - Limiting red meat: no more than a few times a month - When eating red meat: choose lean cuts and keep the portion to the size of deck of cards - Eggs: approx. 0 to 4 times a week  - Fish and lean poultry: at least 2 a week  - Healthy protein sources include, chicken, [place], lean beef, lamb - Increase intake of seafood such as tuna, salmon, trout, mackerel, shrimp, scallops - Avoid or limit high fat processed meats such as sausage and bacon  Dairy - Include moderate amounts of low fat dairy products  - Focus on healthy dairy such as fat free yogurt, skim milk, low or reduced fat cheese - Limit dairy products higher in fat such as whole or 2% milk, cheese, ice cream  Alcohol - Moderate amounts of red wine is ok  - No more than 5 oz daily for women (all ages) and men older than age 34  - No more than 10 oz of wine daily for men younger than 62  Other -  Limit sweets and other desserts  - Use herbs and spices instead of salt to flavor foods  - Herbs and spices common to the traditional Mediterranean Diet include: basil, bay leaves, chives, cloves, cumin, fennel, garlic, lavender, marjoram, mint, oregano, parsley, pepper, rosemary, sage, savory, sumac, tarragon, thyme   It's not just a diet, it's a lifestyle:  . The Mediterranean diet includes lifestyle factors typical of those in the  region  . Foods, drinks and meals are best eaten with others and savored . Daily physical activity is important for overall good health . This could be strenuous exercise like running and aerobics . This could also be more leisurely activities such as walking, housework, yard-work, or taking the stairs . Moderation is the key; a balanced and healthy diet accommodates most foods and drinks . Consider portion sizes and frequency of consumption of certain foods   Meal Ideas & Options:  . Breakfast:  o Whole wheat toast or whole wheat English muffins with peanut butter & hard boiled egg o Steel cut oats topped with apples & cinnamon and skim milk  o Fresh fruit: banana, strawberries, melon, berries, peaches  o Smoothies: strawberries, bananas, greek yogurt, peanut butter o Low fat greek yogurt with blueberries and granola  o Egg white omelet with spinach and mushrooms o Breakfast couscous: whole wheat couscous, apricots, skim milk, cranberries  . Sandwiches:  o Hummus and grilled vegetables (peppers, zucchini, squash) on whole wheat bread   o Grilled chicken on whole wheat pita with lettuce, tomatoes, cucumbers or tzatziki  o Tuna salad on whole wheat bread: tuna salad made with greek yogurt, olives, red peppers, capers, green onions o Garlic rosemary lamb pita: lamb sauted with garlic, rosemary, salt & pepper; add lettuce, cucumber, greek yogurt to pita - flavor with lemon juice and black pepper  . Seafood:  o Mediterranean grilled salmon, seasoned with garlic, basil, parsley, lemon juice and black pepper o Shrimp, lemon, and spinach whole-grain pasta salad made with low fat greek yogurt  o Seared scallops with lemon orzo  o Seared tuna steaks seasoned salt, pepper, coriander topped with tomato mixture of olives, tomatoes, olive oil, minced garlic, parsley, green onions and cappers  . Meats:  o Herbed greek chicken salad with kalamata olives, cucumber, feta  o Red bell peppers stuffed with  spinach, bulgur, lean ground beef (or lentils) & topped with feta   o Kebabs: skewers of chicken, tomatoes, onions, zucchini, squash  o [place] burgers: made with red onions, mint, dill, lemon juice, feta cheese topped with roasted red peppers . Vegetarian o Cucumber salad: cucumbers, artichoke hearts, celery, red onion, feta cheese, tossed in olive oil & lemon juice  o Hummus and whole grain pita points with a greek salad (lettuce, tomato, feta, olives, cucumbers, red onion) o Lentil soup with celery, carrots made with vegetable broth, garlic, salt and pepper  o Tabouli salad: parsley, bulgur, mint, scallions, cucumbers, tomato, radishes, lemon juice, olive oil, salt and pepper.

## 2015-02-25 NOTE — Telephone Encounter (Signed)
Will see today.  

## 2015-02-25 NOTE — Progress Notes (Signed)
BP 158/84 mmHg  Pulse 74  Temp(Src) 98.6 F (37 C) (Oral)  Ht 6' 2.5" (1.892 m)  Wt 296 lb (134.265 kg)  BMI 37.51 kg/m2  SpO2 98%   CC: CPE  Subjective:    Patient ID: Robert Shea, male    DOB: 03/10/1955, 60 y.o.   MRN: [id number]  HPI: Robert Shea is a 60 y.o. male presenting on 02/25/2015 for Annual Exam   Sees Dr Estanislado Pandy for rheumatoid arthritis and PMR. Sees rheum PA Q6 months. Comes in to our office Q3 mo for labwork.   Noticing dyspnea with exertion when climbing flight of stairs over last few weeks (mild). However, this week rode 90 mi bike ride without any dyspnea or chest pain.  Did have stress test done at [facility] 2014. Noted 20lb weight gain.   Over weekend R 2nd MCP pain started.  Off mitigare and antihypertensive for last few days - ran out while at beach.  Took NSAID and next morning hand felt better.  Preventative: COLONOSCOPY Date: 05/2013 1 hyperplastic polyp, rpt 10 yrs Ardis Hughs) Prostate - discussed, would like screening for now with PSA/DRE Flu - yearly today Tdap - 04/2014 Pneumovax 2016 Seat belt use discussed Sunscreen use discussed. No changing moles on skin. Sees derm yearly.   Lives with wife [place] time [place] part time local Occ: [employer]: BS Activity: gym 3x/wk  Diet: healthy - good water, fruits/vegetables daily, avoids fried and fatty foods  Relevant past medical, surgical, family and social history reviewed and updated as indicated. Interim medical history since our last visit reviewed. Allergies and medications reviewed and updated. Current Outpatient Prescriptions on File Prior to Visit  Medication Sig  . amLODipine (NORVASC) 10 MG tablet TAKE 1 TABLET BY MOUTH EVERY DAY  . levothyroxine (SYNTHROID, LEVOTHROID) 100 MCG tablet TAKE ONE TAB DAILY BEFORE BREAKFAST  . predniSONE (DELTASONE) 5 MG tablet Take 5 mg by mouth daily with breakfast.   . simvastatin (ZOCOR) 20 MG tablet TAKE 1 TABLET BY  MOUTH EVERY DAY  . imiquimod (ALDARA) 5 % cream Apply topically 3 (three) times a week. Prior to bedtime (Patient not taking: Reported on 02/25/2015)  . MITIGARE 0.6 MG CAPS TAKE 2 TABLETS BY MOUTH DAILY. (Patient not taking: Reported on 02/25/2015)  . sildenafil (VIAGRA) 100 MG tablet Take 0.5-1 tablets (50-100 mg total) by mouth daily as needed for erectile dysfunction. (Patient not taking: Reported on 02/25/2015)   No current facility-administered medications on file prior to visit.    Review of Systems  Constitutional: Negative for fever, chills, activity change, appetite change, fatigue and unexpected weight change.  HENT: Negative for hearing loss.   Eyes: Negative for visual disturbance.  Respiratory: Positive for shortness of breath. Negative for cough, chest tightness and wheezing.   Cardiovascular: Negative for chest pain, palpitations and leg swelling.  Gastrointestinal: Negative for nausea, vomiting, abdominal pain, diarrhea, constipation, blood in stool and abdominal distention.  Genitourinary: Negative for hematuria and difficulty urinating.  Musculoskeletal: Negative for myalgias, arthralgias and neck pain.  Skin: Negative for rash.  Neurological: Positive for headaches. Negative for dizziness, seizures and syncope.  Hematological: Negative for adenopathy. Does not bruise/bleed easily.  Psychiatric/Behavioral: Negative for dysphoric mood. The patient is not nervous/anxious.    Per HPI unless specifically indicated in ROS section     Objective:    BP 158/84 mmHg  Pulse 74  Temp(Src) 98.6 F (37 C) (Oral)  Ht 6' 2.5" (1.892  m)  Wt 296 lb (134.265 kg)  BMI 37.51 kg/m2  SpO2 98%  Wt Readings from Last 3 Encounters:  02/25/15 296 lb (134.265 kg)  04/25/14 277 lb 4 oz (125.76 kg)  04/17/14 277 lb 1.9 oz (125.701 kg)    Physical Exam  Constitutional: He is oriented to person, place, and time. He appears well-developed and well-nourished. No distress.  HENT:  Head:  Normocephalic and atraumatic.  Right Ear: Hearing, tympanic membrane, external ear and ear canal normal.  Left Ear: Hearing, tympanic membrane, external ear and ear canal normal.  Nose: Nose normal.  Mouth/Throat: Uvula is midline, oropharynx is clear and moist and mucous membranes are normal. No oropharyngeal exudate, posterior oropharyngeal edema or posterior oropharyngeal erythema.  Eyes: Conjunctivae and EOM are normal. Pupils are equal, round, and reactive to light. No scleral icterus.  Neck: Normal range of motion. Neck supple. Carotid bruit is present (L>R). No thyromegaly present.  Cardiovascular: Normal rate, regular rhythm, normal heart sounds and intact distal pulses.   No murmur heard. Pulses:      Radial pulses are 2+ on the right side, and 2+ on the left side.  Pulmonary/Chest: Effort normal and breath sounds normal. No respiratory distress. He has no wheezes. He has no rales.  Abdominal: Soft. Bowel sounds are normal. He exhibits no distension and no mass. There is no tenderness. There is no rebound and no guarding.  Genitourinary: Prostate normal. Rectal exam shows no external hemorrhoid, no internal hemorrhoid, no fissure, no mass, no tenderness and anal tone normal. Prostate is not enlarged and not tender.  Musculoskeletal: Normal range of motion. He exhibits no edema.  Lymphadenopathy:    He has no cervical adenopathy.  Neurological: He is alert and oriented to person, place, and time.  CN grossly intact, station and gait intact  Skin: Skin is warm and dry. No rash noted.  Psychiatric: He has a normal mood and affect. His behavior is normal. Judgment and thought content normal.  Nursing note and vitals reviewed.  Results for orders placed or performed in visit on 02/07/15  Comprehensive metabolic panel  Result Value Ref Range   Sodium 139 135 - 145 mEq/L   Potassium 4.0 3.5 - 5.1 mEq/L   Chloride 100 96 - 112 mEq/L   CO2 29 19 - 32 mEq/L   Glucose, Bld 197 (H) 70 - 99  mg/dL   BUN 15 6 - 23 mg/dL   Creatinine, Ser 1.08 0.40 - 1.50 mg/dL   Total Bilirubin 0.5 0.2 - 1.2 mg/dL   Alkaline Phosphatase 69 39 - 117 U/L   AST 28 0 - 37 U/L   ALT 42 0 - 53 U/L   Total Protein 7.6 6.0 - 8.3 g/dL   Albumin 5.0 3.5 - 5.2 g/dL   Calcium 9.8 8.4 - 10.5 mg/dL   GFR 74.46 >60.00 mL/min  CBC with Differential/Platelet  Result Value Ref Range   WBC 10.0 4.0 - 10.5 K/uL   RBC 4.17 (L) 4.22 - 5.81 Mil/uL   Hemoglobin 13.4 13.0 - 17.0 g/dL   HCT 39.6 39.0 - 52.0 %   MCV 94.9 78.0 - 100.0 fl   MCHC 33.9 30.0 - 36.0 g/dL   RDW 13.5 11.5 - 15.5 %   Platelets 268.0 150.0 - 400.0 K/uL   Neutrophils Relative % 79.8 (H) 43.0 - 77.0 %   Lymphocytes Relative 15.2 12.0 - 46.0 %   Monocytes Relative 3.7 3.0 - 12.0 %   Eosinophils Relative 0.9 0.0 -  5.0 %   Basophils Relative 0.4 0.0 - 3.0 %   Neutro Abs 8.0 (H) 1.4 - 7.7 K/uL   Lymphs Abs 1.5 0.7 - 4.0 K/uL   Monocytes Absolute 0.4 0.1 - 1.0 K/uL   Eosinophils Absolute 0.1 0.0 - 0.7 K/uL   Basophils Absolute 0.0 0.0 - 0.1 K/uL   Lab Results  Component Value Date   TSH 1.93 04/17/2014    Lab Results  Component Value Date   HGBA1C 6.0 04/17/2014       Assessment & Plan:   Problem List Items Addressed This Visit    PMR (polymyalgia rheumatica) ([place])    Have not received any recent records from rheum - will request today.      Obesity, Class II, BMI 35-39.9, with comorbidity (HCC)    Discussed healthy diet and lifestyle changes to affect sustainable weight loss.      Hypothyroidism    Check TSH today.      Relevant Orders   TSH   Hyperglycemia    Check A1c when pt returns for labs      Relevant Orders   Hemoglobin A1c   HTN (hypertension)    Chronic, elevated today as he's been off amlodipine for last few days.       HLD (hyperlipidemia)    Return when fasting for FLP. Continue simvastatin.      Relevant Orders   Lipid panel   Health care maintenance - Primary    Preventative protocols  reviewed and updated unless pt declined. Discussed healthy diet and lifestyle.       Gout    Continue allopurinol. Refilled mitigare. Update urate.      Dyspnea on exertion    Mild, possibly related to deconditioning with weight gain noted over last several months. Reassuring he can bicycle for 10+ miles without significant dyspnea. Advised monitor for now, update if recurrent symptoms for referral to cardiology. Does have fmhx CAD.       Carotid stenosis    [place]. Will update today.      Relevant Orders   Carotid    Other Visit Diagnoses    Need for immunization against influenza        Relevant Orders    Flu Vaccine QUAD 36+ mos PF IM (Fluarix & Fluzone Quad PF) (Completed)    Special screening for malignant neoplasm of prostate        Relevant Orders    PSA        Follow up plan: Return in about 1 year (around 02/25/2016), or as needed, for annual exam, prior fasting for blood work.   Also encouraged pt to return for fasting labwork at his convenience over next few weeks.

## 2015-02-25 NOTE — Telephone Encounter (Signed)
Pt has CPX scheduled on 02/25/15 at 11:30 AM.

## 2015-02-25 NOTE — Progress Notes (Signed)
Pre visit review using our clinic review tool, if applicable. No additional management support is needed unless otherwise documented below in the visit note. 

## 2015-02-25 NOTE — Telephone Encounter (Signed)
PLEASE NOTE: All timestamps contained within this report are represented as [place] Standard Time. CONFIDENTIALTY NOTICE: This fax transmission is intended only for the addressee. It contains information that is legally privileged, confidential or otherwise protected from use or disclosure. If you are not the intended recipient, you are strictly prohibited from reviewing, disclosing, copying using or disseminating any of this information or taking any action in reliance on or regarding this information. If you have received this fax in error, please notify us immediately by telephone so that we can arrange for its return to [place]. Phone: [phone], Toll-Free: [phone], Fax: [phone] Page: 1 of 2 Call Id: [id number] D[place] Patient Name: Robert Shea Gender: Male DOB: 03-Jun-1955 Age: 60 Y 76 M 23 D Return Phone Number: [phone] (Primary) Address: City/State/Zip: [place] Client [place] [facility] - Client Client Site [facility] Physician Ria Bush Contact Type Call Call Type Triage / Clinical Relationship To Patient Self Return Phone Number [phone] (Primary) Chief Complaint Joint Pain Initial Comment Caller states needs script called in- pain and has Gout PreDisposition Call Doctor Translation No Nurse Assessment Nurse: Dimas Chyle, RN, Dellis Filbert Date/Time Eilene Ghazi Time): 02/23/2015 10:13:08 AM Confirm and document reason for call. If symptomatic, describe symptoms. You must click the next button to save text entered. ---Caller states needs script called in- pain and has gout. Symptoms started last night. Has the patient traveled out of the country within the last 30 days? ---No Does the patient have any new or worsening symptoms? ---Yes Will a triage be completed? ---Yes Related visit to physician within the last 2 weeks?  ---N/A Does the PT have any chronic conditions? (i.e. diabetes, asthma, etc.) ---Yes List chronic conditions. ---HTN Is this a behavioral health or substance abuse call? ---No Guidelines Guideline Title Affirmed Question Affirmed Notes Nurse Date/Time Eilene Ghazi Time) Hand and Wrist Pain Swollen joint of new onset Terrence Dupont 02/23/2015 10:15:29 AM Disp. Time Eilene Ghazi Time) Disposition Final User 02/23/2015 10:18:59 AM See PCP When Office is Open (within 3 days) Yes Dimas Chyle, RN, Guerry Minors Understands: Yes Disagree/Comply: Disagree PLEASE NOTE: All timestamps contained within this report are represented as [place] Standard Time. CONFIDENTIALTY NOTICE: This fax transmission is intended only for the addressee. It contains information that is legally privileged, confidential or otherwise protected from use or disclosure. If you are not the intended recipient, you are strictly prohibited from reviewing, disclosing, copying using or disseminating any of this information or taking any action in reliance on or regarding this information. If you have received this fax in error, please notify us immediately by telephone so that we can arrange for its return to [place]. Phone: [phone], Toll-Free: [phone], Fax: [phone] Page: 2 of 2 Call Id: [id number] Disagree/Comply Reason: Disagree with instructions Care Advice Given Per Guideline SEE PCP WITHIN 3 DAYS: * You need to be seen within 2 or 3 days. Call your doctor during regular office hours and make an appointment. An urgent care center is often the best source of care if your doctor's office is closed or you can't get an appointment. NOTE: If office will be open tomorrow, tell caller to call then, not in 3 days. CALL BACK IF: * Fever occurs * You become worse. CARE ADVICE given per Hand and Wrist Pain (Adult) guideline. Comments User: Georga Bora, RN Date/Time Eilene Ghazi Time): 02/23/2015 10:18:48 AM Caller disconnected  before care advice could be  completed. Wanted colchisine called in.

## 2015-02-26 DIAGNOSIS — R0609 Other forms of dyspnea: Secondary | ICD-10-CM

## 2015-02-26 NOTE — Assessment & Plan Note (Signed)
Preventative protocols reviewed and updated unless pt declined. Discussed healthy diet and lifestyle.  

## 2015-02-26 NOTE — Assessment & Plan Note (Signed)
Return when fasting for FLP. Continue simvastatin.

## 2015-02-26 NOTE — Assessment & Plan Note (Signed)
Continue allopurinol. Refilled mitigare. Update urate.

## 2015-02-26 NOTE — Assessment & Plan Note (Signed)
Never received [place]. Will update today.

## 2015-02-26 NOTE — Assessment & Plan Note (Addendum)
Chronic, elevated today as he's been off amlodipine for last few days.

## 2015-02-26 NOTE — Assessment & Plan Note (Signed)
Discussed healthy diet and lifestyle changes to affect sustainable weight loss  

## 2015-02-26 NOTE — Assessment & Plan Note (Signed)
Check TSH today

## 2015-02-26 NOTE — Assessment & Plan Note (Addendum)
Mild, possibly related to deconditioning with weight gain noted over last several months. Reassuring he can bicycle for 10+ miles without significant dyspnea. Advised monitor for now, update if recurrent symptoms for referral to cardiology. Does have fmhx CAD.

## 2015-02-26 NOTE — Assessment & Plan Note (Signed)
Check A1c when pt returns for labs

## 2015-02-26 NOTE — Assessment & Plan Note (Signed)
Have not received any recent records from rheum - will request today.

## 2015-03-05 ENCOUNTER — Telehealth: Payer: Self-pay

## 2015-03-05 NOTE — Telephone Encounter (Signed)
Pt left v/m; pt seen 02/25/15 for annual exam, discussed weight control; pt has been eating more seafood and caused gout flare up; pt is going to call rheumatologist about gout flare up; for Dr Darnell Level pt  Is asking for appetite suppressant for 30 - 60 days to help pt to begin to lose weight. Pt request cb.

## 2015-03-08 NOTE — Telephone Encounter (Signed)
Message left for patient to return my call.  

## 2015-03-08 NOTE — Telephone Encounter (Signed)
plz notify - if he'd like we can try phentermine appetite suppressant. This is a CNS stimulant and can accordingly cause increased bp and pulse, or headache, chest pain - if any of this happens would need to stop.  I would ask him to come in monthly initially to monitor progess.  If he'd like may phone in phentermine 30mg  tablets daily #30 RF1 and schedule f/u appt 1 month.  Call us in interim with any questions or concerns. BMI 37.

## 2015-03-12 ENCOUNTER — Encounter (INDEPENDENT_AMBULATORY_CARE_PROVIDER_SITE_OTHER): Payer: Self-pay

## 2015-03-12 ENCOUNTER — Ambulatory Visit: Payer: 59

## 2015-03-12 ENCOUNTER — Telehealth: Payer: Self-pay | Admitting: Family Medicine

## 2015-03-12 DIAGNOSIS — I6523 Occlusion and stenosis of bilateral carotid arteries: Secondary | ICD-10-CM

## 2015-03-12 NOTE — Telephone Encounter (Signed)
Message left for patient to return my call. Working off of other phone note.

## 2015-03-12 NOTE — Telephone Encounter (Signed)
Pt returned your call-please call back  

## 2015-03-13 NOTE — Telephone Encounter (Signed)
Spoke with patient.

## 2015-03-13 NOTE — Telephone Encounter (Signed)
Patient returned Kim's call. °

## 2015-03-13 NOTE — Telephone Encounter (Signed)
Spoke with patient. He was notified of possible side effects of med and still wanted to try it. Called in as directed. He is coming in tomorrow for fasting labs and will make 1 month follow up at that time.

## 2015-03-14 ENCOUNTER — Other Ambulatory Visit: Payer: 59

## 2015-03-15 ENCOUNTER — Encounter: Payer: Self-pay | Admitting: *Deleted

## 2015-03-24 ENCOUNTER — Other Ambulatory Visit: Payer: Self-pay | Admitting: Family Medicine

## 2015-04-08 ENCOUNTER — Other Ambulatory Visit (INDEPENDENT_AMBULATORY_CARE_PROVIDER_SITE_OTHER): Payer: 59

## 2015-04-08 ENCOUNTER — Other Ambulatory Visit: Payer: 59

## 2015-04-08 ENCOUNTER — Encounter: Payer: Self-pay | Admitting: Family Medicine

## 2015-04-08 ENCOUNTER — Ambulatory Visit (INDEPENDENT_AMBULATORY_CARE_PROVIDER_SITE_OTHER): Payer: 59 | Admitting: Family Medicine

## 2015-04-08 ENCOUNTER — Telehealth: Payer: Self-pay | Admitting: Family Medicine

## 2015-04-08 VITALS — BP 166/88 | HR 80 | Temp 98.0°F | Wt 286.8 lb

## 2015-04-08 DIAGNOSIS — E039 Hypothyroidism, unspecified: Secondary | ICD-10-CM

## 2015-04-08 DIAGNOSIS — E785 Hyperlipidemia, unspecified: Secondary | ICD-10-CM | POA: Diagnosis not present

## 2015-04-08 DIAGNOSIS — R739 Hyperglycemia, unspecified: Secondary | ICD-10-CM

## 2015-04-08 DIAGNOSIS — I6523 Occlusion and stenosis of bilateral carotid arteries: Secondary | ICD-10-CM | POA: Diagnosis not present

## 2015-04-08 DIAGNOSIS — Z125 Encounter for screening for malignant neoplasm of prostate: Secondary | ICD-10-CM | POA: Diagnosis not present

## 2015-04-08 DIAGNOSIS — Z79899 Other long term (current) drug therapy: Secondary | ICD-10-CM

## 2015-04-08 DIAGNOSIS — I10 Essential (primary) hypertension: Secondary | ICD-10-CM | POA: Diagnosis not present

## 2015-04-08 DIAGNOSIS — IMO0001 Reserved for inherently not codable concepts without codable children: Secondary | ICD-10-CM

## 2015-04-08 DIAGNOSIS — M353 Polymyalgia rheumatica: Secondary | ICD-10-CM

## 2015-04-08 LAB — CBC WITH DIFFERENTIAL/PLATELET
BASOS PCT: 0.4 % (ref 0.0–3.0)
Basophils Absolute: 0 10*3/uL (ref 0.0–0.1)
EOS PCT: 2 % (ref 0.0–5.0)
Eosinophils Absolute: 0.1 10*3/uL (ref 0.0–0.7)
HCT: 39.9 % (ref 39.0–52.0)
Hemoglobin: 13.7 g/dL (ref 13.0–17.0)
LYMPHS ABS: 1.2 10*3/uL (ref 0.7–4.0)
Lymphocytes Relative: 17.9 % (ref 12.0–46.0)
MCHC: 34.3 g/dL (ref 30.0–36.0)
MCV: 94 fl (ref 78.0–100.0)
MONO ABS: 0.4 10*3/uL (ref 0.1–1.0)
Monocytes Relative: 6 % (ref 3.0–12.0)
NEUTROS PCT: 73.7 % (ref 43.0–77.0)
Neutro Abs: 4.9 10*3/uL (ref 1.4–7.7)
Platelets: 254 10*3/uL (ref 150.0–400.0)
RBC: 4.24 Mil/uL (ref 4.22–5.81)
RDW: 13.3 % (ref 11.5–15.5)
WBC: 6.6 10*3/uL (ref 4.0–10.5)

## 2015-04-08 LAB — COMPREHENSIVE METABOLIC PANEL
ALK PHOS: 62 U/L (ref 39–117)
ALT: 63 U/L — ABNORMAL HIGH (ref 0–53)
AST: 41 U/L — ABNORMAL HIGH (ref 0–37)
Albumin: 5 g/dL (ref 3.5–5.2)
BUN: 13 mg/dL (ref 6–23)
CO2: 27 mEq/L (ref 19–32)
Calcium: 9.8 mg/dL (ref 8.4–10.5)
Chloride: 102 mEq/L (ref 96–112)
Creatinine, Ser: 1 mg/dL (ref 0.40–1.50)
GFR: 81.33 mL/min (ref >60.00)
GLUCOSE: 209 mg/dL — AB (ref 70–99)
POTASSIUM: 4.4 meq/L (ref 3.5–5.1)
SODIUM: 139 meq/L (ref 135–145)
Total Bilirubin: 0.8 mg/dL (ref 0.2–1.2)
Total Protein: 7.3 g/dL (ref 6.0–8.3)

## 2015-04-08 LAB — HEMOGLOBIN A1C: Hgb A1c MFr Bld: 6.5 % (ref 4.6–6.5)

## 2015-04-08 LAB — PSA: PSA: 1.73 ng/mL (ref 0.10–4.00)

## 2015-04-08 LAB — LIPID PANEL
CHOLESTEROL: 183 mg/dL (ref 0–200)
HDL: 38.7 mg/dL — ABNORMAL LOW (ref >39.00)
LDL CALC: 106 mg/dL — AB (ref 0–99)
NonHDL: 144.33
TRIGLYCERIDES: 191 mg/dL — AB (ref 0.0–149.0)
Total CHOL/HDL Ratio: 5
VLDL: 38.2 mg/dL (ref 0.0–40.0)

## 2015-04-08 LAB — TSH: TSH: 1.43 u[IU]/mL (ref 0.35–4.50)

## 2015-04-08 MED ORDER — BENAZEPRIL HCL 10 MG PO TABS: 10.0000 mg | ORAL_TABLET | Freq: Every day | ORAL | Status: DC

## 2015-04-08 MED ORDER — PHENTERMINE HCL 30 MG PO CAPS: 30.0000 mg | ORAL_CAPSULE | ORAL | Status: DC

## 2015-04-08 NOTE — Assessment & Plan Note (Signed)
10 lb weight loss noted with phentermine. Pt desires to continue medication, has stopped caffeine. Hopeful to continue for 1-2 mo then discontinue when he finishes taper off prednisone. Printed script and provided to patient. Reassess 3 mo at f/u visit.

## 2015-04-08 NOTE — Progress Notes (Addendum)
BP 166/88 mmHg  Pulse 80  Temp(Src) 98 F (36.7 C) (Oral)  Wt 286 lb 12 oz (130.069 kg)   CC: BP check   Subjective:    Patient ID: Robert Shea, male    DOB: [birth year], 60 y.o.   MRN: [id number]  HPI: Robert Shea is a 60 y.o. male presenting on 04/08/2015 for Blood Pressure Check   See prior note for details. Seen here for CPE 02/25/2015. At that time, bp elevated to 150/80s, had been off amlodipine for a few days. He also endorsed mild dyspnea on exertion with flight of stairs (but able to bike 16 mi without symptoms). Dyspnea has improved with weight loss (10lb weight loss) in last month. Watching diet more closely, started keeping food diary as well as started phentermine 30mg  daily. Requests refill of phentermine. He stopped drinking coffee. Denies headache, leg swelling. No dyspnea or chest pain.    Not fasting today (ate apple as he was walking in).  labwork obtained today.   Relevant past medical, surgical, family and social history reviewed and updated as indicated. Interim medical history since our last visit reviewed. Allergies and medications reviewed and updated. Current Outpatient Prescriptions on File Prior to Visit  Medication Sig  . allopurinol (ZYLOPRIM) 300 MG tablet Take 1 tablet (300 mg total) by mouth daily.  Marland Kitchen amLODipine (NORVASC) 10 MG tablet TAKE 1 TABLET BY MOUTH EVERY DAY  . folic acid (FOLVITE) 1 MG tablet Take 1 mg by mouth daily.  Marland Kitchen levothyroxine (SYNTHROID, LEVOTHROID) 100 MCG tablet TAKE ONE TAB DAILY BEFORE BREAKFAST  . methotrexate (RHEUMATREX) 2.5 MG tablet Take 10 mg by mouth once a week.  Marland Kitchen MITIGARE 0.6 MG CAPS TAKE 2 TABLETS BY MOUTH DAILY.  Marland Kitchen predniSONE (DELTASONE) 1 MG tablet Take 3 mg by mouth daily with breakfast.   . simvastatin (ZOCOR) 20 MG tablet TAKE 1 TABLET BY MOUTH EVERY DAY   No current facility-administered medications on file prior to visit.    Review of Systems Per HPI unless specifically indicated in ROS section       Objective:    BP 166/88 mmHg  Pulse 80  Temp(Src) 98 F (36.7 C) (Oral)  Wt 286 lb 12 oz (130.069 kg)  Wt Readings from Last 3 Encounters:  04/08/15 286 lb 12 oz (130.069 kg)  02/25/15 296 lb (134.265 kg)  04/25/14 277 lb 4 oz (125.76 kg)    Physical Exam  Constitutional: He appears well-developed and well-nourished. No distress.  HENT:  Mouth/Throat: Oropharynx is clear and moist. No oropharyngeal exudate.  Eyes: Conjunctivae and EOM are normal. Pupils are equal, round, and reactive to light. No scleral icterus.  Neck: Normal range of motion. Neck supple. No thyromegaly present.  Cardiovascular: Normal rate, regular rhythm, normal heart sounds and intact distal pulses.   No murmur heard. Pulmonary/Chest: Effort normal and breath sounds normal. No respiratory distress. He has no wheezes. He has no rales.  Musculoskeletal: He exhibits no edema.  Lymphadenopathy:    He has no cervical adenopathy.  Skin: Skin is warm and dry. No rash noted.  Psychiatric: He has a normal mood and affect.  Nursing note and vitals reviewed.  Results for orders placed or performed in visit on 04/08/15  Comprehensive metabolic panel  Result Value Ref Range   Sodium 139 135 - 145 mEq/L   Potassium 4.4 3.5 - 5.1 mEq/L   Chloride 102 96 - 112 mEq/L   CO2 27 19 - 32 mEq/L  Glucose, Bld 209 (H) 70 - 99 mg/dL   BUN 13 6 - 23 mg/dL   Creatinine, Ser 1.00 0.40 - 1.50 mg/dL   Total Bilirubin 0.8 0.2 - 1.2 mg/dL   Alkaline Phosphatase 62 39 - 117 U/L   AST 41 (H) 0 - 37 U/L   ALT 63 (H) 0 - 53 U/L   Total Protein 7.3 6.0 - 8.3 g/dL   Albumin 5.0 3.5 - 5.2 g/dL   Calcium 9.8 8.4 - 10.5 mg/dL   GFR 81.33 >60.00 mL/min  CBC with Differential/Platelet  Result Value Ref Range   WBC 6.6 4.0 - 10.5 K/uL   RBC 4.24 4.22 - 5.81 Mil/uL   Hemoglobin 13.7 13.0 - 17.0 g/dL   HCT 39.9 39.0 - 52.0 %   MCV 94.0 78.0 - 100.0 fl   MCHC 34.3 30.0 - 36.0 g/dL   RDW 13.3 11.5 - 15.5 %   Platelets 254.0 150.0 -  400.0 K/uL   Neutrophils Relative % 73.7 43.0 - 77.0 %   Lymphocytes Relative 17.9 12.0 - 46.0 %   Monocytes Relative 6.0 3.0 - 12.0 %   Eosinophils Relative 2.0 0.0 - 5.0 %   Basophils Relative 0.4 0.0 - 3.0 %   Neutro Abs 4.9 1.4 - 7.7 K/uL   Lymphs Abs 1.2 0.7 - 4.0 K/uL   Monocytes Absolute 0.4 0.1 - 1.0 K/uL   Eosinophils Absolute 0.1 0.0 - 0.7 K/uL   Basophils Absolute 0.0 0.0 - 0.1 K/uL  Lipid panel  Result Value Ref Range   Cholesterol 183 0 - 200 mg/dL   Triglycerides 191.0 (H) 0.0 - 149.0 mg/dL   HDL 38.70 (L) >39.00 mg/dL   VLDL 38.2 0.0 - 40.0 mg/dL   LDL Cholesterol 106 (H) 0 - 99 mg/dL   Total CHOL/HDL Ratio 5    NonHDL 144.33   TSH  Result Value Ref Range   TSH 1.43 0.35 - 4.50 uIU/mL  Hemoglobin A1c  Result Value Ref Range   Hgb A1c MFr Bld 6.5 4.6 - 6.5 %  PSA  Result Value Ref Range   PSA 1.73 0.10 - 4.00 ng/mL      Assessment & Plan:   Problem List Items Addressed This Visit    HTN (hypertension) - Primary    Chronic, deteriorated despite amlodipine. We did recently start phentermine. EKG today. Add benazepril 10mg  daily.  EKG - NSR rate 60, normal axis, intervals, no acute ST/T changes      Relevant Medications   benazepril (LOTENSIN) 10 MG tablet   Other Relevant Orders   EKG 12-Lead (Completed)   Hypothyroidism    TSH today. Continue levothyroxine 159mcg daily.      Carotid stenosis    Minimal plaque on [place] 03/2015. Will only need to monitor clinically.      Relevant Medications   benazepril (LOTENSIN) 10 MG tablet   Hyperglycemia    Pending A1c today. Steroid induced hyperglycemia.       Obesity, Class II, BMI 35-39.9, with comorbidity (HCC)    10 lb weight loss noted with phentermine. Pt desires to continue medication, has stopped caffeine. Hopeful to continue for 1-2 mo then discontinue when he finishes taper off prednisone. Printed script and provided to patient. Reassess 3 mo at f/u visit.      Relevant Medications   phentermine  30 MG capsule   PMR (polymyalgia rheumatica) (HCC)    Working on slow taper off prednisone.  Follow up plan: Return in about 3 months (around 07/08/2015), or as needed, for follow up visit.  Ria Bush, MD

## 2015-04-08 NOTE — Assessment & Plan Note (Signed)
Pending A1c today. Steroid induced hyperglycemia.

## 2015-04-08 NOTE — Patient Instructions (Addendum)
Blood work today. We will fax results to Dr Estanislado Pandy.  Blood pressure staying elevated - start benazepril 10mg  daily sent to pharmacy.  I have printed out phentermine for you to refill.  EKG today.  Return in 3 months for follow up visit, sooner if needed.

## 2015-04-08 NOTE — Addendum Note (Signed)
Addended by: Royann Shivers A on: 04/08/2015 12:01 PM   Modules accepted: Orders

## 2015-04-08 NOTE — Telephone Encounter (Signed)
Pt called and wanted labs faxed to Dr. Arlean Hopping office.  Chart indicates that we did fax on 02/11/15, but they did not receive.  Spoke with Inez Catalina at their office, re-faxed labs from 02/11/15 at pt request so dr. Estanislado Pandy can refill medications [phone] lt

## 2015-04-08 NOTE — Assessment & Plan Note (Addendum)
Chronic, deteriorated despite amlodipine. We did recently start phentermine. EKG today. Add benazepril 10mg  daily.  EKG - NSR rate 60, normal axis, intervals, no acute ST/T changes

## 2015-04-08 NOTE — Assessment & Plan Note (Signed)
TSH today. Continue levothyroxine 112mcg daily.

## 2015-04-08 NOTE — Progress Notes (Signed)
Pre visit review using our clinic review tool, if applicable. No additional management support is needed unless otherwise documented below in the visit note. 

## 2015-04-08 NOTE — Assessment & Plan Note (Signed)
Working on slow taper off prednisone.

## 2015-04-08 NOTE — Assessment & Plan Note (Signed)
Minimal plaque on [place] 03/2015. Will only need to monitor clinically.

## 2015-04-09 ENCOUNTER — Other Ambulatory Visit: Payer: Self-pay | Admitting: Family Medicine

## 2015-04-12 ENCOUNTER — Encounter: Payer: Self-pay | Admitting: *Deleted

## 2015-06-11 ENCOUNTER — Encounter: Payer: Self-pay | Admitting: Family Medicine

## 2015-06-11 ENCOUNTER — Ambulatory Visit (INDEPENDENT_AMBULATORY_CARE_PROVIDER_SITE_OTHER): Payer: 59 | Admitting: Family Medicine

## 2015-06-11 VITALS — BP 140/70 | HR 75 | Temp 98.1°F | Ht 75.0 in | Wt 281.4 lb

## 2015-06-11 DIAGNOSIS — M1A00X0 Idiopathic chronic gout, unspecified site, without tophus (tophi): Secondary | ICD-10-CM

## 2015-06-11 DIAGNOSIS — M546 Pain in thoracic spine: Secondary | ICD-10-CM | POA: Insufficient documentation

## 2015-06-11 DIAGNOSIS — Z79899 Other long term (current) drug therapy: Secondary | ICD-10-CM | POA: Diagnosis not present

## 2015-06-11 DIAGNOSIS — M353 Polymyalgia rheumatica: Secondary | ICD-10-CM | POA: Diagnosis not present

## 2015-06-11 LAB — COMPREHENSIVE METABOLIC PANEL
ALBUMIN: 4.9 g/dL (ref 3.5–5.2)
ALT: 50 U/L (ref 0–53)
AST: 33 U/L (ref 0–37)
Alkaline Phosphatase: 67 U/L (ref 39–117)
BILIRUBIN TOTAL: 0.6 mg/dL (ref 0.2–1.2)
BUN: 16 mg/dL (ref 6–23)
CALCIUM: 10 mg/dL (ref 8.4–10.5)
CHLORIDE: 101 meq/L (ref 96–112)
CO2: 28 mEq/L (ref 19–32)
CREATININE: 1.11 mg/dL (ref 0.40–1.50)
GFR: 72.06 mL/min (ref >60.00)
Glucose, Bld: 147 mg/dL — ABNORMAL HIGH (ref 70–99)
Potassium: 4.5 mEq/L (ref 3.5–5.1)
Sodium: 139 mEq/L (ref 135–145)
Total Protein: 7.5 g/dL (ref 6.0–8.3)

## 2015-06-11 LAB — CBC WITH DIFFERENTIAL/PLATELET
BASOS ABS: 0 10*3/uL (ref 0.0–0.1)
Basophils Relative: 0.4 % (ref 0.0–3.0)
EOS ABS: 0.2 10*3/uL (ref 0.0–0.7)
Eosinophils Relative: 2.8 % (ref 0.0–5.0)
HEMATOCRIT: 41.8 % (ref 39.0–52.0)
HEMOGLOBIN: 14.2 g/dL (ref 13.0–17.0)
LYMPHS PCT: 23.6 % (ref 12.0–46.0)
Lymphs Abs: 1.6 10*3/uL (ref 0.7–4.0)
MCHC: 34.1 g/dL (ref 30.0–36.0)
MCV: 93.9 fl (ref 78.0–100.0)
Monocytes Absolute: 0.5 10*3/uL (ref 0.1–1.0)
Monocytes Relative: 7.9 % (ref 3.0–12.0)
Neutro Abs: 4.3 10*3/uL (ref 1.4–7.7)
Neutrophils Relative %: 65.3 % (ref 43.0–77.0)
Platelets: 265 10*3/uL (ref 150.0–400.0)
RBC: 4.45 Mil/uL (ref 4.22–5.81)
RDW: 13.5 % (ref 11.5–15.5)
WBC: 6.7 10*3/uL (ref 4.0–10.5)

## 2015-06-11 LAB — URIC ACID: Uric Acid, Serum: 5.3 mg/dL (ref 4.0–7.8)

## 2015-06-11 MED ORDER — CYCLOBENZAPRINE HCL 10 MG PO TABS: 5.0000 mg | ORAL_TABLET | Freq: Two times a day (BID) | ORAL | Status: DC | PRN

## 2015-06-11 NOTE — Progress Notes (Signed)
Pre visit review using our clinic review tool, if applicable. No additional management support is needed unless otherwise documented below in the visit note. 

## 2015-06-11 NOTE — Patient Instructions (Addendum)
Pass by lab for standing orders. I think this is ligament strain - should continue to improve over next 2 weeks. Continue ibuprofen as needed. Heating pad to back if helpful. Use flexeril muscle relaxant 5-10mg  twice daily as needed (caution sedation).  If not improving with this, let us know for xrays.

## 2015-06-11 NOTE — Progress Notes (Signed)
BP 140/70 mmHg  Pulse 75  Temp(Src) 98.1 F (36.7 C)  Ht 6\' 3"  (1.905 m)  Wt 281 lb 6.4 oz (127.642 kg)  BMI 35.17 kg/m2  SpO2 97%   CC: back pain   Subjective:    Patient ID: Robert Shea, male    DOB: Nov 04, 1955, 60 y.o.   MRN: [id number]  HPI: Robert Shea is a 60 y.o. male presenting on 06/11/2015 for Back Pain   Recent trip to [place].   DOI: early May 1 mo h/o back pain. Started after driving lawnmower into truck, banged his head on truck covering. Injured head and cervical neck. Now some radiation of pain to mid shoulder blades. Describes sharp pain worse with turning or heavy lifting.   Self treated with ibuprofen 800mg  PRN.   Denies shooting pain down arms, numbness/weakness down arms, fevers/chills, leg pain. No headaches.   Relevant past medical, surgical, family and social history reviewed and updated as indicated. Interim medical history since our last visit reviewed. Allergies and medications reviewed and updated. Current Outpatient Prescriptions on File Prior to Visit  Medication Sig  . allopurinol (ZYLOPRIM) 300 MG tablet Take 1 tablet (300 mg total) by mouth daily.  Marland Kitchen amLODipine (NORVASC) 10 MG tablet TAKE 1 TABLET BY MOUTH EVERY DAY  . benazepril (LOTENSIN) 10 MG tablet Take 1 tablet (10 mg total) by mouth daily.  . folic acid (FOLVITE) 1 MG tablet Take 1 mg by mouth daily.  Marland Kitchen levothyroxine (SYNTHROID, LEVOTHROID) 100 MCG tablet TAKE ONE TAB DAILY BEFORE BREAKFAST  . methotrexate (RHEUMATREX) 2.5 MG tablet Take 10 mg by mouth once a week.  Marland Kitchen MITIGARE 0.6 MG CAPS TAKE 2 TABLETS BY MOUTH DAILY.  Marland Kitchen phentermine 30 MG capsule Take 1 capsule (30 mg total) by mouth every morning.  . predniSONE (DELTASONE) 1 MG tablet Take 3 mg by mouth daily with breakfast.   . simvastatin (ZOCOR) 20 MG tablet TAKE 1 TABLET BY MOUTH EVERY DAY  . VIAGRA 100 MG tablet TAKE 1/2 TO 1 TABLET BY MOUTH DAILY AS NEEDED FOR ERECTILE DYSFUNCTION   No current facility-administered  medications on file prior to visit.    Review of Systems Per HPI unless specifically indicated in ROS section     Objective:    BP 140/70 mmHg  Pulse 75  Temp(Src) 98.1 F (36.7 C)  Ht 6\' 3"  (1.905 m)  Wt 281 lb 6.4 oz (127.642 kg)  BMI 35.17 kg/m2  SpO2 97%  Wt Readings from Last 3 Encounters:  06/11/15 281 lb 6.4 oz (127.642 kg)  04/08/15 286 lb 12 oz (130.069 kg)  02/25/15 296 lb (134.265 kg)    Physical Exam  Constitutional: He is oriented to person, place, and time. He appears well-developed and well-nourished. No distress.  Musculoskeletal: He exhibits no edema.  No midline spine tenderness  No significant paracervical or trapezius mm tenderness FROM at neck and shoulders  Neurological: He is alert and oriented to person, place, and time.  5/5 strength BUE Sensation intact  Skin: Skin is warm and dry. No rash noted.  Psychiatric: He has a normal mood and affect.  Nursing note and vitals reviewed.  Results for orders placed or performed in visit on 04/08/15  Comprehensive metabolic panel  Result Value Ref Range   Sodium 139 135 - 145 mEq/L   Potassium 4.4 3.5 - 5.1 mEq/L   Chloride 102 96 - 112 mEq/L   CO2 27 19 - 32 mEq/L   Glucose, Bld 209 (  H) 70 - 99 mg/dL   BUN 13 6 - 23 mg/dL   Creatinine, Ser 1.00 0.40 - 1.50 mg/dL   Total Bilirubin 0.8 0.2 - 1.2 mg/dL   Alkaline Phosphatase 62 39 - 117 U/L   AST 41 (H) 0 - 37 U/L   ALT 63 (H) 0 - 53 U/L   Total Protein 7.3 6.0 - 8.3 g/dL   Albumin 5.0 3.5 - 5.2 g/dL   Calcium 9.8 8.4 - 10.5 mg/dL   GFR 81.33 >60.00 mL/min  CBC with Differential/Platelet  Result Value Ref Range   WBC 6.6 4.0 - 10.5 K/uL   RBC 4.24 4.22 - 5.81 Mil/uL   Hemoglobin 13.7 13.0 - 17.0 g/dL   HCT 39.9 39.0 - 52.0 %   MCV 94.0 78.0 - 100.0 fl   MCHC 34.3 30.0 - 36.0 g/dL   RDW 13.3 11.5 - 15.5 %   Platelets 254.0 150.0 - 400.0 K/uL   Neutrophils Relative % 73.7 43.0 - 77.0 %   Lymphocytes Relative 17.9 12.0 - 46.0 %   Monocytes  Relative 6.0 3.0 - 12.0 %   Eosinophils Relative 2.0 0.0 - 5.0 %   Basophils Relative 0.4 0.0 - 3.0 %   Neutro Abs 4.9 1.4 - 7.7 K/uL   Lymphs Abs 1.2 0.7 - 4.0 K/uL   Monocytes Absolute 0.4 0.1 - 1.0 K/uL   Eosinophils Absolute 0.1 0.0 - 0.7 K/uL   Basophils Absolute 0.0 0.0 - 0.1 K/uL  Lipid panel  Result Value Ref Range   Cholesterol 183 0 - 200 mg/dL   Triglycerides 191.0 (H) 0.0 - 149.0 mg/dL   HDL 38.70 (L) >39.00 mg/dL   VLDL 38.2 0.0 - 40.0 mg/dL   LDL Cholesterol 106 (H) 0 - 99 mg/dL   Total CHOL/HDL Ratio 5    NonHDL 144.33   TSH  Result Value Ref Range   TSH 1.43 0.35 - 4.50 uIU/mL  Hemoglobin A1c  Result Value Ref Range   Hgb A1c MFr Bld 6.5 4.6 - 6.5 %  PSA  Result Value Ref Range   PSA 1.73 0.10 - 4.00 ng/mL      Assessment & Plan:   Problem List Items Addressed This Visit    Gout - Primary   Relevant Orders   Uric acid   PMR (polymyalgia rheumatica) (HCC)   Thoracic back pain    Ongoing thoracic back pain after injury suffered early May - seems to be improving as expected. Anticipate cervical and thoracic back strain. Continue NSAID, add flexeril. Update if continued improvement not noted for xrays (thoracic, cervical). Pt agrees with plan.      Relevant Medications   cyclobenzaprine (FLEXERIL) 10 MG tablet    Other Visit Diagnoses    Encounter for long-term (current) use of medications            Follow up plan: Return if symptoms worsen or fail to improve.  Ria Bush, MD

## 2015-06-11 NOTE — Assessment & Plan Note (Signed)
Ongoing thoracic back pain after injury suffered early May - seems to be improving as expected. Anticipate cervical and thoracic back strain. Continue NSAID, add flexeril. Update if continued improvement not noted for xrays (thoracic, cervical). Pt agrees with plan.

## 2015-06-12 ENCOUNTER — Encounter: Payer: Self-pay | Admitting: *Deleted

## 2015-06-22 ENCOUNTER — Other Ambulatory Visit: Payer: Self-pay | Admitting: Family Medicine

## 2015-07-01 ENCOUNTER — Other Ambulatory Visit: Payer: Self-pay | Admitting: Family Medicine

## 2015-07-01 NOTE — Telephone Encounter (Signed)
Ok to refill 

## 2015-08-09 ENCOUNTER — Other Ambulatory Visit: Payer: Self-pay

## 2015-08-09 MED ORDER — PHENTERMINE HCL 30 MG PO CAPS: 30.0000 mg | ORAL_CAPSULE | ORAL | 2 refills | Status: DC

## 2015-08-09 NOTE — Telephone Encounter (Signed)
Phoned in.  Will refill x 2 months

## 2015-08-09 NOTE — Telephone Encounter (Signed)
Pt left v/m; pt still taking prednisone from rheumatologist; pt losing 5-6 lbs per month; pt request refill phentermine to [facility], seen 04/08/15 and discussed phentermine; pt was to have 3 mth f/u appt. Do not see scheduled. Last refilled # 30 x 2 on 04/08/15. Please advise.

## 2015-08-13 ENCOUNTER — Other Ambulatory Visit: Payer: Self-pay | Admitting: Family Medicine

## 2015-08-13 NOTE — Telephone Encounter (Signed)
Last filled 07-02-15 #30 Last OV 06-11-15 Next OV 02-26-16

## 2015-08-25 ENCOUNTER — Other Ambulatory Visit: Payer: Self-pay | Admitting: Family Medicine

## 2015-10-01 ENCOUNTER — Other Ambulatory Visit: Payer: Self-pay | Admitting: Family Medicine

## 2015-10-07 ENCOUNTER — Other Ambulatory Visit: Payer: Self-pay | Admitting: *Deleted

## 2015-10-07 MED ORDER — BENAZEPRIL HCL 10 MG PO TABS: 10.0000 mg | ORAL_TABLET | Freq: Every day | ORAL | 0 refills | Status: DC

## 2015-10-15 ENCOUNTER — Other Ambulatory Visit (INDEPENDENT_AMBULATORY_CARE_PROVIDER_SITE_OTHER): Payer: Managed Care, Other (non HMO)

## 2015-10-15 DIAGNOSIS — Z79899 Other long term (current) drug therapy: Secondary | ICD-10-CM

## 2015-10-15 LAB — COMPREHENSIVE METABOLIC PANEL
ALBUMIN: 4.4 g/dL (ref 3.5–5.2)
ALT: 41 U/L (ref 0–53)
AST: 25 U/L (ref 0–37)
Alkaline Phosphatase: 68 U/L (ref 39–117)
BUN: 14 mg/dL (ref 6–23)
CALCIUM: 9.8 mg/dL (ref 8.4–10.5)
CO2: 29 mEq/L (ref 19–32)
CREATININE: 1.04 mg/dL (ref 0.40–1.50)
Chloride: 102 mEq/L (ref 96–112)
GFR: 77.59 mL/min (ref >60.00)
Glucose, Bld: 142 mg/dL — ABNORMAL HIGH (ref 70–99)
Potassium: 4.2 mEq/L (ref 3.5–5.1)
Sodium: 140 mEq/L (ref 135–145)
Total Bilirubin: 0.5 mg/dL (ref 0.2–1.2)
Total Protein: 7.1 g/dL (ref 6.0–8.3)

## 2015-10-15 LAB — CBC WITH DIFFERENTIAL/PLATELET
BASOS PCT: 0.5 % (ref 0.0–3.0)
Basophils Absolute: 0 10*3/uL (ref 0.0–0.1)
EOS PCT: 3.8 % (ref 0.0–5.0)
Eosinophils Absolute: 0.2 10*3/uL (ref 0.0–0.7)
HEMATOCRIT: 39.2 % (ref 39.0–52.0)
HEMOGLOBIN: 13.5 g/dL (ref 13.0–17.0)
LYMPHS PCT: 26.6 % (ref 12.0–46.0)
Lymphs Abs: 1.6 10*3/uL (ref 0.7–4.0)
MCHC: 34.4 g/dL (ref 30.0–36.0)
MCV: 93.9 fl (ref 78.0–100.0)
MONOS PCT: 7.1 % (ref 3.0–12.0)
Monocytes Absolute: 0.4 10*3/uL (ref 0.1–1.0)
Neutro Abs: 3.8 10*3/uL (ref 1.4–7.7)
Neutrophils Relative %: 62 % (ref 43.0–77.0)
Platelets: 258 10*3/uL (ref 150.0–400.0)
RBC: 4.17 Mil/uL — AB (ref 4.22–5.81)
RDW: 12.8 % (ref 11.5–15.5)
WBC: 6.1 10*3/uL (ref 4.0–10.5)

## 2015-10-16 ENCOUNTER — Other Ambulatory Visit: Payer: 59

## 2015-10-21 ENCOUNTER — Ambulatory Visit: Payer: 59 | Admitting: Family Medicine

## 2015-10-25 ENCOUNTER — Ambulatory Visit (INDEPENDENT_AMBULATORY_CARE_PROVIDER_SITE_OTHER): Payer: Managed Care, Other (non HMO) | Admitting: Family Medicine

## 2015-10-25 ENCOUNTER — Ambulatory Visit (INDEPENDENT_AMBULATORY_CARE_PROVIDER_SITE_OTHER)
Admission: RE | Admit: 2015-10-25 | Discharge: 2015-10-25 | Disposition: A | Discharge status: Home / Self Care | Payer: Managed Care, Other (non HMO) | Source: Ambulatory Visit | Attending: Family Medicine | Admitting: Family Medicine

## 2015-10-25 ENCOUNTER — Encounter (INDEPENDENT_AMBULATORY_CARE_PROVIDER_SITE_OTHER): Payer: Self-pay

## 2015-10-25 ENCOUNTER — Encounter: Payer: Self-pay | Admitting: Family Medicine

## 2015-10-25 VITALS — BP 126/80 | HR 80 | Temp 98.0°F | Wt 270.2 lb

## 2015-10-25 DIAGNOSIS — IMO0001 Reserved for inherently not codable concepts without codable children: Secondary | ICD-10-CM

## 2015-10-25 DIAGNOSIS — M542 Cervicalgia: Secondary | ICD-10-CM | POA: Diagnosis not present

## 2015-10-25 DIAGNOSIS — G4733 Obstructive sleep apnea (adult) (pediatric): Secondary | ICD-10-CM | POA: Insufficient documentation

## 2015-10-25 DIAGNOSIS — M79604 Pain in right leg: Secondary | ICD-10-CM

## 2015-10-25 DIAGNOSIS — L720 Epidermal cyst: Secondary | ICD-10-CM | POA: Insufficient documentation

## 2015-10-25 DIAGNOSIS — M545 Low back pain: Secondary | ICD-10-CM

## 2015-10-25 DIAGNOSIS — Z9989 Dependence on other enabling machines and devices: Secondary | ICD-10-CM

## 2015-10-25 DIAGNOSIS — E669 Obesity, unspecified: Secondary | ICD-10-CM

## 2015-10-25 DIAGNOSIS — M353 Polymyalgia rheumatica: Secondary | ICD-10-CM | POA: Diagnosis not present

## 2015-10-25 DIAGNOSIS — Z23 Encounter for immunization: Secondary | ICD-10-CM

## 2015-10-25 NOTE — Progress Notes (Addendum)
BP 126/80   Pulse 80   Temp 98 F (36.7 C) (Oral)   Wt 270 lb 4 oz (122.6 kg)   BMI 33.78 kg/m    CC: discuss several concerns Subjective:    Patient ID: [name], male    DOB: 01-Aug-1955, 60 y.o.   MRN: [id number]  HPI: ESTIBEN VLAD is a 60 y.o. male presenting on 10/25/2015 for Multiple Issues (wants referrals for sleep study; check place on face)   Obesity - weight slowly dropping, healthier diet and lifestyles changes. Son recently got married. Continues phentermine PRN. Stays active - walking with wife, riding bicycle.   Known OSA - has old CPAP machine and mask, requests updated sleep study. Last sleep study was 15 yrs ago. Regularly uses CPAP. Trouble sleeping if not used. +snoring, daytime somnolence, non restorative sleeping, + PNdyspnea and apnea (if he doesn't use his CPAP machine).   Ongoing lower back pain and cervical neck pain after he hit head while taking lawn mower out of car - takes PRN flexeril. DOI - early May 2017. Seen here 06/2015. Sore at muscles, not midline. Neck also creaks with range of motion. Worsened after recently mowing lawn. H/o PMR but endorses this feels different. Stopped prednisone 1 wk ago (was on 1mg  prednisone QOD).   1.5 mo h/o white bump R lower lip. Not itchy or tender. Not changing.   Relevant past medical, surgical, family and social history reviewed and updated as indicated. Interim medical history since our last visit reviewed. Allergies and medications reviewed and updated. Current Outpatient Prescriptions on File Prior to Visit  Medication Sig  . allopurinol (ZYLOPRIM) 300 MG tablet Take 1 tablet (300 mg total) by mouth daily.  Marland Kitchen amLODipine (NORVASC) 10 MG tablet TAKE 1 TABLET BY MOUTH EVERY DAY  . benazepril (LOTENSIN) 10 MG tablet Take 1 tablet (10 mg total) by mouth daily.  . cyclobenzaprine (FLEXERIL) 10 MG tablet TAKE 1/2 TO 1 TABLET BY MOUTH 2 TIMES DAILY AS NEEDED FOR MUSCLE SPASMS  . folic acid (FOLVITE) 1 MG  tablet Take 1 mg by mouth daily.  Marland Kitchen levothyroxine (SYNTHROID, LEVOTHROID) 100 MCG tablet TAKE ONE TAB DAILY BEFORE BREAKFAST  . methotrexate (RHEUMATREX) 2.5 MG tablet Take 10 mg by mouth once a week.  Marland Kitchen MITIGARE 0.6 MG CAPS TAKE 2 TABLETS BY MOUTH DAILY.  Marland Kitchen phentermine 30 MG capsule Take 1 capsule (30 mg total) by mouth every morning.  . simvastatin (ZOCOR) 20 MG tablet TAKE 1 TABLET BY MOUTH EVERY DAY  . VIAGRA 100 MG tablet TAKE 1/2 TO 1 TABLET BY MOUTH DAILY AS NEEDED FOR ERECTILE DYSFUNCTION   No current facility-administered medications on file prior to visit.     Review of Systems Per HPI unless specifically indicated in ROS section     Objective:    BP 126/80   Pulse 80   Temp 98 F (36.7 C) (Oral)   Wt 270 lb 4 oz (122.6 kg)   BMI 33.78 kg/m   Wt Readings from Last 3 Encounters:  10/25/15 270 lb 4 oz (122.6 kg)  06/11/15 281 lb 6.4 oz (127.6 kg)  04/08/15 286 lb 12 oz (130.1 kg)    Physical Exam  Constitutional: He appears well-developed and well-nourished. No distress.  Neck: Normal range of motion. Neck supple.  Cardiovascular: Normal rate, regular rhythm, normal heart sounds and intact distal pulses.   No murmur heard. Pulmonary/Chest: Effort normal and breath sounds normal. No respiratory distress. He has no wheezes.  He has no rales.  Musculoskeletal: He exhibits no edema.  FROM at neck without midline or paracervical tenderness No pain midline spine No paraspinous mm tenderness Neg SLR bilaterally. No pain with int/ext rotation at hip.  Neurological:  Neg spurling   Skin: Skin is warm and dry. No rash noted.  Small 70mm milia R face lateral to lower lip  Psychiatric: He has a normal mood and affect.  Nursing note and vitals reviewed.  Results for orders placed or performed in visit on 10/15/15  Comprehensive metabolic panel  Result Value Ref Range   Sodium 140 135 - 145 mEq/L   Potassium 4.2 3.5 - 5.1 mEq/L   Chloride 102 96 - 112 mEq/L   CO2 29 19 -  32 mEq/L   Glucose, Bld 142 (H) 70 - 99 mg/dL   BUN 14 6 - 23 mg/dL   Creatinine, Ser 1.04 0.40 - 1.50 mg/dL   Total Bilirubin 0.5 0.2 - 1.2 mg/dL   Alkaline Phosphatase 68 39 - 117 U/L   AST 25 0 - 37 U/L   ALT 41 0 - 53 U/L   Total Protein 7.1 6.0 - 8.3 g/dL   Albumin 4.4 3.5 - 5.2 g/dL   Calcium 9.8 8.4 - 10.5 mg/dL   GFR 77.59 >60.00 mL/min  CBC with Differential/Platelet  Result Value Ref Range   WBC 6.1 4.0 - 10.5 K/uL   RBC 4.17 (L) 4.22 - 5.81 Mil/uL   Hemoglobin 13.5 13.0 - 17.0 g/dL   HCT 39.2 39.0 - 52.0 %   MCV 93.9 78.0 - 100.0 fl   MCHC 34.4 30.0 - 36.0 g/dL   RDW 12.8 11.5 - 15.5 %   Platelets 258.0 150.0 - 400.0 K/uL   Neutrophils Relative % 62.0 43.0 - 77.0 %   Lymphocytes Relative 26.6 12.0 - 46.0 %   Monocytes Relative 7.1 3.0 - 12.0 %   Eosinophils Relative 3.8 0.0 - 5.0 %   Basophils Relative 0.5 0.0 - 3.0 %   Neutro Abs 3.8 1.4 - 7.7 K/uL   Lymphs Abs 1.6 0.7 - 4.0 K/uL   Monocytes Absolute 0.4 0.1 - 1.0 K/uL   Eosinophils Absolute 0.2 0.0 - 0.7 K/uL   Basophils Absolute 0.0 0.0 - 0.1 K/uL   Lab Results  Component Value Date   TSH 1.43 04/08/2015    Lab Results  Component Value Date   HGBA1C 6.5 04/08/2015       Assessment & Plan:   Problem List Items Addressed This Visit    Low back pain radiating to right lower extremity    Benign exam today. Continue PRN NSAID, flexeril.       Milia    Discussed possible scar development. Area cleaned with alcohol, 18g needle used to open milia and white sebaceous material was removed. Area dressed with abx ointment and bandage. Pt tolerated well.       Neck pain - Primary    Not significant pain, predominant concern is increased crepitus. Started after injury sustained while driving lawn mower into truck. Check cervical films to r/o fracture or other cause, eval for arthritic burden.       Relevant Orders   DG Cervical Spine Complete (Completed)   Obesity, Class II, BMI 35-39.9, with comorbidity  ([place])    Congratulated on weight loss to date. Continue healthy diet and lifestyle changes, PRN phentermine      OSA on CPAP    Refer back to pulm to establish for known CPAP.  Likely needs updated sleep study.       Relevant Orders   Ambulatory referral to Pulmonology   PMR (polymyalgia rheumatica) (HCC)    Stable period. Continue MTX. Off prednisone        Other Visit Diagnoses    Need for influenza vaccination       Relevant Orders   Flu Vaccine QUAD 36+ mos PF IM (Fluarix & Fluzone Quad PF) (Completed)       Follow up plan: No Follow-up on file.  Ria Bush, MD

## 2015-10-25 NOTE — Patient Instructions (Addendum)
Flu shot today. Xray of neck today Continue ibuprofen and flexeril as needed We will refer you to lung doctor for further evaluation of sleep apnea.  Warm compresses to area at lip.

## 2015-10-25 NOTE — Assessment & Plan Note (Signed)
Benign exam today. Continue PRN NSAID, flexeril.

## 2015-10-25 NOTE — Assessment & Plan Note (Signed)
Not significant pain, predominant concern is increased crepitus. Started after injury sustained while driving lawn mower into truck. Check cervical films to r/o fracture or other cause, eval for arthritic burden.

## 2015-10-25 NOTE — Progress Notes (Signed)
Pre visit review using our clinic review tool, if applicable. No additional management support is needed unless otherwise documented below in the visit note. 

## 2015-10-25 NOTE — Assessment & Plan Note (Signed)
Stable period. Continue MTX. Off prednisone

## 2015-10-25 NOTE — Assessment & Plan Note (Signed)
Refer back to pulm to establish for known CPAP. Likely needs updated sleep study.

## 2015-10-25 NOTE — Assessment & Plan Note (Signed)
Congratulated on weight loss to date. Continue healthy diet and lifestyle changes, PRN phentermine

## 2015-10-25 NOTE — Assessment & Plan Note (Addendum)
Discussed possible scar development. Area cleaned with alcohol, 18g needle used to open milia and white sebaceous material was removed. Area dressed with abx ointment and bandage. Pt tolerated well.

## 2015-10-28 DIAGNOSIS — M19041 Primary osteoarthritis, right hand: Secondary | ICD-10-CM | POA: Insufficient documentation

## 2015-10-28 DIAGNOSIS — Z79899 Other long term (current) drug therapy: Secondary | ICD-10-CM | POA: Insufficient documentation

## 2015-10-28 DIAGNOSIS — M19042 Primary osteoarthritis, left hand: Secondary | ICD-10-CM

## 2015-10-28 DIAGNOSIS — M159 Polyosteoarthritis, unspecified: Secondary | ICD-10-CM | POA: Insufficient documentation

## 2015-10-28 NOTE — Assessment & Plan Note (Signed)
Patient has known history of polymyalgia rheumatica he's been treated with tapering dose of prednisone and low-dose methotrexate at 10 milligrams per week.
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
## 2015-10-29 ENCOUNTER — Encounter: Payer: Self-pay | Admitting: Rheumatology

## 2015-10-29 ENCOUNTER — Ambulatory Visit (INDEPENDENT_AMBULATORY_CARE_PROVIDER_SITE_OTHER): Payer: Managed Care, Other (non HMO) | Admitting: Rheumatology

## 2015-10-29 VITALS — BP 142/69 | HR 73 | Resp 14 | Ht 75.0 in | Wt 274.0 lb

## 2015-10-29 DIAGNOSIS — M7711 Lateral epicondylitis, right elbow: Secondary | ICD-10-CM | POA: Diagnosis not present

## 2015-10-29 DIAGNOSIS — M19041 Primary osteoarthritis, right hand: Secondary | ICD-10-CM | POA: Diagnosis not present

## 2015-10-29 DIAGNOSIS — Z79899 Other long term (current) drug therapy: Secondary | ICD-10-CM

## 2015-10-29 DIAGNOSIS — M353 Polymyalgia rheumatica: Secondary | ICD-10-CM

## 2015-10-29 DIAGNOSIS — M19042 Primary osteoarthritis, left hand: Secondary | ICD-10-CM

## 2015-10-29 DIAGNOSIS — Z7952 Long term (current) use of systemic steroids: Secondary | ICD-10-CM

## 2015-10-29 DIAGNOSIS — M160 Bilateral primary osteoarthritis of hip: Secondary | ICD-10-CM

## 2015-10-29 DIAGNOSIS — M19072 Primary osteoarthritis, left ankle and foot: Secondary | ICD-10-CM

## 2015-10-29 DIAGNOSIS — M19071 Primary osteoarthritis, right ankle and foot: Secondary | ICD-10-CM | POA: Diagnosis not present

## 2015-10-29 DIAGNOSIS — M1A0790 Idiopathic chronic gout, unspecified ankle and foot, without tophus (tophi): Secondary | ICD-10-CM

## 2015-10-29 NOTE — Patient Instructions (Signed)

## 2015-11-04 ENCOUNTER — Other Ambulatory Visit: Payer: Self-pay | Admitting: Rheumatology

## 2015-11-04 ENCOUNTER — Telehealth: Payer: Self-pay | Admitting: Radiology

## 2015-11-04 MED ORDER — METHOTREXATE (ANTI-RHEUMATIC) 2.5 MG PO TABS: 10.0000 mg | ORAL_TABLET | ORAL | 0 refills | Status: DC

## 2015-11-04 NOTE — Telephone Encounter (Signed)
Pt states he went to pick up his MTX at [facility] [place] [facility] and was told his rx was declined because he hasn't seen Dr. Estanislado Pandy. Patient was seen in our office last week. Patient would [facility].

## 2015-11-04 NOTE — Telephone Encounter (Signed)
Have not gotten a request  He was here 10/29/15 Labs WNL 10/15/15 Next visit not scheduled, sent message to [facility] Left message to advise Dr Estanislado Pandy approved, we did not decline this.

## 2015-11-04 NOTE — Telephone Encounter (Signed)
Patient needs follow up appt with Dr Estanislado Pandy pls call to make appt. March

## 2015-11-05 ENCOUNTER — Ambulatory Visit: Payer: Managed Care, Other (non HMO) | Attending: Rheumatology | Admitting: Physical Therapy

## 2015-11-15 ENCOUNTER — Institutional Professional Consult (permissible substitution): Payer: 59 | Admitting: Internal Medicine

## 2015-11-21 ENCOUNTER — Ambulatory Visit (INDEPENDENT_AMBULATORY_CARE_PROVIDER_SITE_OTHER): Payer: Managed Care, Other (non HMO) | Admitting: Internal Medicine

## 2015-11-21 ENCOUNTER — Encounter: Payer: Self-pay | Admitting: Internal Medicine

## 2015-11-21 ENCOUNTER — Other Ambulatory Visit: Payer: Self-pay | Admitting: Rheumatology

## 2015-11-21 VITALS — BP 148/88 | HR 70 | Ht 75.0 in | Wt 273.0 lb

## 2015-11-21 DIAGNOSIS — G4719 Other hypersomnia: Secondary | ICD-10-CM | POA: Diagnosis not present

## 2015-11-21 DIAGNOSIS — G4730 Sleep apnea, unspecified: Secondary | ICD-10-CM | POA: Diagnosis not present

## 2015-11-21 NOTE — Patient Instructions (Signed)
Plan  For Sleep Study  Sleep Apnea Sleep apnea is a condition that affects breathing. People with sleep apnea have moments during sleep when their breathing pauses briefly or gets shallow. Sleep apnea can cause these symptoms:  Trouble staying asleep.  Sleepiness or tiredness during the day.  Irritability.  Loud snoring.  Morning headaches.  Trouble concentrating.  Forgetting things.  Less interest in sex.  Being sleepy for no reason.  Mood swings.  Personality changes.  Depression.  Waking up a lot during the night to pee (urinate).  Dry mouth.  Sore throat. Follow these instructions at home:  Make any changes in your routine that your doctor recommends.  Eat a healthy, well-balanced diet.  Take over-the-counter and prescription medicines only as told by your doctor.  Avoid using alcohol, calming medicines (sedatives), and narcotic medicines.  Take steps to lose weight if you are overweight.  If you were given a machine (device) to use while you sleep, use it only as told by your doctor.  Do not use any tobacco products, such as cigarettes, chewing tobacco, and e-cigarettes. If you need help quitting, ask your doctor.  Keep all follow-up visits as told by your doctor. This is important. Contact a doctor if:  The machine that you were given to use during sleep is uncomfortable or does not seem to be working.  Your symptoms do not get better.  Your symptoms get worse. Get help right away if:  Your chest hurts.  You have trouble breathing in enough air (shortness of breath).  You have an uncomfortable feeling in your back, arms, or stomach.  You have trouble talking.  One side of your body feels weak.  A part of your face is hanging down (drooping). These symptoms may be an emergency. Do not wait to see if the symptoms will go away. Get medical help right away. Call your local emergency services (911 in the [place] Do not drive yourself to the  hospital.  This information is not intended to replace advice given to you by your health care provider. Make sure you discuss any questions you have with your health care provider. Document Released: 10/01/2007 Document Revised: 08/18/2015 Document Reviewed: 10/01/2014 Elsevier Interactive Patient Education  2017 [employer].
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
## 2015-11-27 ENCOUNTER — Other Ambulatory Visit: Payer: Self-pay

## 2015-11-27 NOTE — Telephone Encounter (Signed)
Patient would like a refill on Phentermine. He said he's lost 40lbs so far and wants to lose another 25-30.

## 2015-12-02 MED ORDER — PHENTERMINE HCL 30 MG PO CAPS: 30.0000 mg | ORAL_CAPSULE | ORAL | 2 refills | Status: DC

## 2015-12-02 NOTE — Telephone Encounter (Signed)
Rx request phoned to pharmacy/[facility]  

## 2015-12-02 NOTE — Telephone Encounter (Signed)
plz phone in. Seen 10/25/2015

## 2015-12-03 ENCOUNTER — Other Ambulatory Visit: Payer: Self-pay | Admitting: Radiology

## 2015-12-03 DIAGNOSIS — Z7952 Long term (current) use of systemic steroids: Secondary | ICD-10-CM

## 2015-12-04 ENCOUNTER — Telehealth: Payer: Self-pay | Admitting: Internal Medicine

## 2015-12-04 DIAGNOSIS — G4719 Other hypersomnia: Secondary | ICD-10-CM

## 2015-12-04 NOTE — Telephone Encounter (Signed)
Per Ria Comment with [facility], [employer], [place], prefers patient to have HST instead of in lab study   Please advise if HST is ok, if so, please place order for HST and cancel order for in lab [place],   Thanks, Carlos Levering [employer]

## 2015-12-04 NOTE — Telephone Encounter (Signed)
Order placed for HST per pt's insurance.

## 2015-12-25 ENCOUNTER — Ambulatory Visit (INDEPENDENT_AMBULATORY_CARE_PROVIDER_SITE_OTHER): Payer: Managed Care, Other (non HMO) | Admitting: Family Medicine

## 2015-12-25 ENCOUNTER — Encounter: Payer: Self-pay | Admitting: Family Medicine

## 2015-12-25 VITALS — BP 128/72 | HR 72 | Temp 98.1°F | Wt 270.8 lb

## 2015-12-25 DIAGNOSIS — K130 Diseases of lips: Secondary | ICD-10-CM | POA: Diagnosis not present
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
## 2015-12-25 NOTE — Patient Instructions (Signed)
You have small sore of lower lip that is just not healing.  Treat with vaseline mixed with cortisone 10 OTC three times daily for 1 week. Let us know if persistent or not improving for dermatology referral.
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
## 2015-12-25 NOTE — Progress Notes (Signed)
Pre visit review using our clinic review tool, if applicable. No additional management support is needed unless otherwise documented below in the visit note. 

## 2015-12-28 ENCOUNTER — Other Ambulatory Visit: Payer: Self-pay | Admitting: Family Medicine

## 2015-12-31 MED ORDER — MITIGARE 0.6 MG PO CAPS: 2.0000 | ORAL_CAPSULE | Freq: Every day | ORAL | 6 refills | Status: DC

## 2016-01-14 ENCOUNTER — Other Ambulatory Visit: Payer: Self-pay | Admitting: Rheumatology

## 2016-01-14 NOTE — Telephone Encounter (Signed)
Last Visit: 10/29/15 Next Visit: 03/11/16 Labs: 10/15/15 Elevated Glucose  Okay to refill Allopurinol?

## 2016-01-14 NOTE — Telephone Encounter (Signed)
ok 

## 2016-01-16 ENCOUNTER — Other Ambulatory Visit: Payer: Self-pay | Admitting: Family Medicine

## 2016-01-24 ENCOUNTER — Encounter: Payer: Self-pay | Admitting: Internal Medicine

## 2016-01-24 DIAGNOSIS — G4719 Other hypersomnia: Secondary | ICD-10-CM

## 2016-01-24 DIAGNOSIS — G4733 Obstructive sleep apnea (adult) (pediatric): Secondary | ICD-10-CM | POA: Diagnosis not present

## 2016-01-29 ENCOUNTER — Telehealth: Payer: Self-pay | Admitting: *Deleted

## 2016-01-29 DIAGNOSIS — G4733 Obstructive sleep apnea (adult) (pediatric): Secondary | ICD-10-CM

## 2016-01-29 NOTE — Telephone Encounter (Signed)
Spoke with pt and informed him of sleep results and that we are placing an order for BiPAP auto setting. Nothing further needed.

## 2016-02-03 ENCOUNTER — Telehealth: Payer: Self-pay | Admitting: Internal Medicine

## 2016-02-03 NOTE — Telephone Encounter (Signed)
Called and spoke with patient and he is having problems communicating with [employer] and is getting flustered trying to contact rep back. Pt is on regular [employer] not [employer].  Called and spoke with Estill Bamberg at [place] and she is going to do some research and will return my call. Rhonda J Cobb

## 2016-02-03 NOTE — Telephone Encounter (Signed)
Per Estill Bamberg at [facility], order has been submitted to [employer]'s Bi[facility] department to obtain approval. Once approved, device can be shipped.  Called and spoke with patient and he is aware of the above. Nothing else needed at this time. Rhonda J Cobb  [facility] is also aware that pt's insurance is not [employer]. Rhonda J Cobb

## 2016-02-03 NOTE — Telephone Encounter (Signed)
Pt calling asking if we can call him back He has some questions on his sleep study On the breathing machine  He is not happy with who we choose  Please advise.

## 2016-02-10 ENCOUNTER — Other Ambulatory Visit: Payer: Self-pay | Admitting: Rheumatology

## 2016-02-10 NOTE — Telephone Encounter (Signed)
Last Visit: 10/29/15 Next Visit: 03/11/16 Labs: 10/15/15 Elevated Glucose Patient reminded he is due for labs and will update them next week.   Okay to refill 30 day of MTX?

## 2016-02-13 ENCOUNTER — Other Ambulatory Visit: Payer: Self-pay | Admitting: Family Medicine

## 2016-02-16 ENCOUNTER — Other Ambulatory Visit: Payer: Self-pay | Admitting: Family Medicine

## 2016-02-17 ENCOUNTER — Ambulatory Visit (INDEPENDENT_AMBULATORY_CARE_PROVIDER_SITE_OTHER): Payer: Managed Care, Other (non HMO) | Admitting: Family Medicine

## 2016-02-17 ENCOUNTER — Encounter: Payer: Self-pay | Admitting: Family Medicine

## 2016-02-17 VITALS — BP 128/72 | HR 80 | Temp 98.3°F | Wt 265.5 lb

## 2016-02-17 DIAGNOSIS — I10 Essential (primary) hypertension: Secondary | ICD-10-CM

## 2016-02-17 DIAGNOSIS — E669 Obesity, unspecified: Secondary | ICD-10-CM

## 2016-02-17 DIAGNOSIS — M353 Polymyalgia rheumatica: Secondary | ICD-10-CM | POA: Diagnosis not present

## 2016-02-17 DIAGNOSIS — K130 Diseases of lips: Secondary | ICD-10-CM | POA: Diagnosis not present

## 2016-02-17 DIAGNOSIS — R739 Hyperglycemia, unspecified: Secondary | ICD-10-CM | POA: Diagnosis not present

## 2016-02-17 DIAGNOSIS — Z79899 Other long term (current) drug therapy: Secondary | ICD-10-CM

## 2016-02-17 DIAGNOSIS — M1A0790 Idiopathic chronic gout, unspecified ankle and foot, without tophus (tophi): Secondary | ICD-10-CM | POA: Diagnosis not present

## 2016-02-17 LAB — VITAMIN D 25 HYDROXY (VIT D DEFICIENCY, FRACTURES): VITD: 19.61 ng/mL — AB (ref 30.00–100.00)

## 2016-02-17 LAB — COMPREHENSIVE METABOLIC PANEL
ALK PHOS: 65 U/L (ref 39–117)
ALT: 41 U/L (ref 0–53)
AST: 33 U/L (ref 0–37)
Albumin: 4.6 g/dL (ref 3.5–5.2)
BILIRUBIN TOTAL: 0.6 mg/dL (ref 0.2–1.2)
BUN: 14 mg/dL (ref 6–23)
CO2: 29 meq/L (ref 19–32)
Calcium: 9.6 mg/dL (ref 8.4–10.5)
Chloride: 105 mEq/L (ref 96–112)
Creatinine, Ser: 0.99 mg/dL (ref 0.40–1.50)
GFR: 82.04 mL/min (ref >60.00)
GLUCOSE: 141 mg/dL — AB (ref 70–99)
Potassium: 4.2 mEq/L (ref 3.5–5.1)
Sodium: 140 mEq/L (ref 135–145)
TOTAL PROTEIN: 7 g/dL (ref 6.0–8.3)

## 2016-02-17 LAB — CBC WITH DIFFERENTIAL/PLATELET
BASOS ABS: 0 10*3/uL (ref 0.0–0.1)
Basophils Relative: 0.9 % (ref 0.0–3.0)
Eosinophils Absolute: 0.1 10*3/uL (ref 0.0–0.7)
Eosinophils Relative: 2 % (ref 0.0–5.0)
HCT: 38.2 % — ABNORMAL LOW (ref 39.0–52.0)
Hemoglobin: 13.1 g/dL (ref 13.0–17.0)
LYMPHS ABS: 1.2 10*3/uL (ref 0.7–4.0)
Lymphocytes Relative: 23.2 % (ref 12.0–46.0)
MCHC: 34.3 g/dL (ref 30.0–36.0)
MCV: 94.2 fl (ref 78.0–100.0)
MONO ABS: 0.4 10*3/uL (ref 0.1–1.0)
MONOS PCT: 7.1 % (ref 3.0–12.0)
NEUTROS ABS: 3.5 10*3/uL (ref 1.4–7.7)
NEUTROS PCT: 66.8 % (ref 43.0–77.0)
PLATELETS: 236 10*3/uL (ref 150.0–400.0)
RBC: 4.06 Mil/uL — AB (ref 4.22–5.81)
RDW: 13.6 % (ref 11.5–15.5)
WBC: 5.2 10*3/uL (ref 4.0–10.5)

## 2016-02-17 LAB — URIC ACID: URIC ACID, SERUM: 4.9 mg/dL (ref 4.0–7.8)

## 2016-02-17 LAB — HEMOGLOBIN A1C: HEMOGLOBIN A1C: 5.8 % (ref 4.6–6.5)

## 2016-02-17 NOTE — Assessment & Plan Note (Addendum)
Discussed healthy diet and lifestyle changes to affect sustainable weight loss. Congratulated on weight loss to date.  

## 2016-02-17 NOTE — Progress Notes (Signed)
BP 128/72   Pulse 80   Temp 98.3 F (36.8 C) (Oral)   Wt 265 lb 8 oz (120.4 kg)   BMI 33.19 kg/m    CC: f/u visit Subjective:    Patient ID: Robert Shea, male    DOB: July 23, 1955, 61 y.o.   MRN: [id number]  HPI: Robert Shea is a 61 y.o. male presenting on 02/17/2016 for Follow-up (recheck lip)   Persistent lip lesion present over the past 3 months. Evaluated 12/25/2015 - thought shallow poorly healing lip ulcer, treated with vaseline and cortisone 10. Plan was derm referral if not fully improved. Initially improved but never fully healed. He does find it is irritated easily with hot food or salt or hard surfaces (ie drinking from water bottle). Will refer to derm today.   No other skin rash, no oral lesions, no penile lesions. Uses chap stick daily - burt's bees. Continues vaseline use as well.  Denies smokeless tobacco. Quit smoking several years ago.   Gout - on allopurinol 300mg  daily and mitigare 0.6mg  1 tab daily (Rx 2).  PMR - on MTX 4 tab weekly. Prior on prednisone.  Obesity - slowly working on weight loss. 30 min daily cardio. Healthy diet changes - eliminating starches.   Relevant past medical, surgical, family and social history reviewed and updated as indicated. Interim medical history since our last visit reviewed. Allergies and medications reviewed and updated. Current Outpatient Prescriptions on File Prior to Visit  Medication Sig  . allopurinol (ZYLOPRIM) 300 MG tablet TAKE 1 TABLET BY MOUTH DAILY  . amLODipine (NORVASC) 10 MG tablet TAKE 1 TABLET BY MOUTH EVERY DAY  . benazepril (LOTENSIN) 10 MG tablet Take 1 tablet (10 mg total) by mouth daily.  . cyclobenzaprine (FLEXERIL) 10 MG tablet TAKE 1/2 TO 1 TABLET BY MOUTH 2 TIMES DAILY AS NEEDED FOR MUSCLE SPASMS  . folic acid (FOLVITE) 1 MG tablet Take 1 mg by mouth daily.  Marland Kitchen levothyroxine (SYNTHROID, LEVOTHROID) 100 MCG tablet TAKE ONE TAB DAILY BEFORE BREAKFAST  . methotrexate (RHEUMATREX) 2.5 MG tablet  TAKE 4 TABLETS (10 MG TOTAL) BY MOUTH ONCE A WEEK.  Marland Kitchen MITIGARE 0.6 MG CAPS Take 2 tablets by mouth daily.  . phentermine 30 MG capsule Take 1 capsule (30 mg total) by mouth every morning.  . simvastatin (ZOCOR) 20 MG tablet TAKE 1 TABLET BY MOUTH EVERY DAY  . VIAGRA 100 MG tablet TAKE 1/2 TO 1 TABLET BY MOUTH DAILY AS NEEDED FOR ERECTILE DYSFUNCTION   No current facility-administered medications on file prior to visit.     Review of Systems Per HPI unless specifically indicated in ROS section     Objective:    BP 128/72   Pulse 80   Temp 98.3 F (36.8 C) (Oral)   Wt 265 lb 8 oz (120.4 kg)   BMI 33.19 kg/m   Wt Readings from Last 3 Encounters:  02/17/16 265 lb 8 oz (120.4 kg)  12/25/15 270 lb 12 oz (122.8 kg)  11/21/15 273 lb (123.8 kg)    Physical Exam  Constitutional: He appears well-developed and well-nourished. No distress.  Skin: Skin is warm and dry. No erythema.  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  Results for orders placed or performed in visit on 10/15/15  Comprehensive metabolic panel  Result Value Ref Range   Sodium 140 135 - 145 mEq/L   Potassium 4.2 3.5 - 5.1 mEq/L   Chloride  102 96 - 112 mEq/L   CO2 29 19 - 32 mEq/L   Glucose, Bld 142 (H) 70 - 99 mg/dL   BUN 14 6 - 23 mg/dL   Creatinine, Ser 1.04 0.40 - 1.50 mg/dL   Total Bilirubin 0.5 0.2 - 1.2 mg/dL   Alkaline Phosphatase 68 39 - 117 U/L   AST 25 0 - 37 U/L   ALT 41 0 - 53 U/L   Total Protein 7.1 6.0 - 8.3 g/dL   Albumin 4.4 3.5 - 5.2 g/dL   Calcium 9.8 8.4 - 10.5 mg/dL   GFR 77.59 >60.00 mL/min  CBC with Differential/Platelet  Result Value Ref Range   WBC 6.1 4.0 - 10.5 K/uL   RBC 4.17 (L) 4.22 - 5.81 Mil/uL   Hemoglobin 13.5 13.0 - 17.0 g/dL   HCT 39.2 39.0 - 52.0 %   MCV 93.9 78.0 - 100.0 fl   MCHC 34.4 30.0 - 36.0 g/dL   RDW 12.8 11.5 - 15.5 %   Platelets 258.0 150.0 - 400.0 K/uL   Neutrophils Relative % 62.0 43.0  - 77.0 %   Lymphocytes Relative 26.6 12.0 - 46.0 %   Monocytes Relative 7.1 3.0 - 12.0 %   Eosinophils Relative 3.8 0.0 - 5.0 %   Basophils Relative 0.5 0.0 - 3.0 %   Neutro Abs 3.8 1.4 - 7.7 K/uL   Lymphs Abs 1.6 0.7 - 4.0 K/uL   Monocytes Absolute 0.4 0.1 - 1.0 K/uL   Eosinophils Absolute 0.2 0.0 - 0.7 K/uL   Basophils Absolute 0.0 0.0 - 0.1 K/uL   Lab Results  Component Value Date   TSH 1.43 04/08/2015    Lab Results  Component Value Date   LABURIC 5.3 06/11/2015    Lab Results  Component Value Date   HGBA1C 6.5 04/08/2015       Assessment & Plan:   Problem List Items Addressed This Visit    Gout    Chronic, stable. Continue allopurinol 300mg  daily + colchicine 0.6mg  daily.       HTN (hypertension)    Chronic, stable. Continue current regimen.       Hyperglycemia    Check A1c. H/o steroid induced hyperglycemia.      Relevant Orders   Hemoglobin A1c   Lesion of lip - Primary    Continued poorly healing lip ulcer - MTX may be contributing to poor healing. Suggested he discuss this with rheum ?take short break from MTX to see better resulting healing. Will also refer to derm for further evaluation. Pt agrees with plan.      Relevant Orders   Ambulatory referral to Dermatology   Obesity, Class I, BMI 30-34.9    Discussed healthy diet and lifestyle changes to affect sustainable weight loss. Congratulated on weight loss to date.       PMR (polymyalgia rheumatica) (HCC)    Prior on prednisone, now on MTX with plan to continue for at least 1 year. Appreciate rheum care of patient.        Other Visit Diagnoses    Encounter for long-term (current) use of medications           Follow up plan: Return as needed.  Ria Bush, MD

## 2016-02-17 NOTE — Assessment & Plan Note (Signed)
Chronic, stable. Continue allopurinol 300mg  daily + colchicine 0.6mg  daily.

## 2016-02-17 NOTE — Progress Notes (Signed)
Pre visit review using our clinic review tool, if applicable. No additional management support is needed unless otherwise documented below in the visit note. 

## 2016-02-17 NOTE — Addendum Note (Signed)
Addended by: Ellamae Sia on: 02/17/2016 09:36 AM   Modules accepted: Orders

## 2016-02-17 NOTE — Assessment & Plan Note (Signed)
Continued poorly healing lip ulcer - MTX may be contributing to poor healing. Suggested he discuss this with rheum ?take short break from MTX to see better resulting healing. Will also refer to derm for further evaluation. Pt agrees with plan.

## 2016-02-17 NOTE — Assessment & Plan Note (Signed)
Prior on prednisone, now on MTX with plan to continue for at least 1 year. Appreciate rheum care of patient.

## 2016-02-17 NOTE — Assessment & Plan Note (Addendum)
Check A1c. H/o steroid induced hyperglycemia.

## 2016-02-17 NOTE — Assessment & Plan Note (Signed)
Chronic, stable. Continue current regimen. 

## 2016-02-17 NOTE — Patient Instructions (Signed)
Labs today (standing orders and uric acid, vit D) We will refer you to skin doctor to check lip wound - poor healing may be coming from methotrexate - check with rheum about this as well.

## 2016-02-19 ENCOUNTER — Telehealth: Payer: Self-pay | Admitting: Internal Medicine

## 2016-02-19 NOTE — Telephone Encounter (Signed)
Opal Sidles from [facility] calling stating they are processing the order for pt Cpap machine  They need a description sent to them  They are faxing over a request  Just letting us know to keep an eye on it

## 2016-02-19 NOTE — Telephone Encounter (Signed)
Paper is in DR's folder to have signed. Will fax back once signed.

## 2016-02-20 ENCOUNTER — Other Ambulatory Visit: Payer: 59

## 2016-02-24 ENCOUNTER — Telehealth: Payer: Self-pay | Admitting: *Deleted

## 2016-02-24 MED ORDER — VITAMIN D (ERGOCALCIFEROL) 1.25 MG (50000 UNIT) PO CAPS: 50000.0000 [IU] | ORAL_CAPSULE | ORAL | 0 refills | Status: DC

## 2016-02-24 NOTE — Telephone Encounter (Signed)
-----   Message from [place], [place] sent at 02/18/2016  7:33 PM EST ----- I am sending labs to PCP Can tell patient Vitamin D is abnormally low at 19.6 We will treat as follows Vitamin D3; sig:, 50,000 IUs 2 times a week for 12 weeks; dispense: 24 pills with no refill Recheck vitamin D 25 OH in 3 months  #2: Uric acid is normal at 4.9 (desirable range.[Tell patient to continue eating proper gout diet, staying hydrated, taking proper gout medication as he has been doing])

## 2016-02-24 NOTE — Telephone Encounter (Signed)
Patient states he took his MTX over the weekend and is not feeling well after taking it. Patient states he doe not have any refill on his Zofran and is requesting a refill on the Zofran.  Okay to refill Zofran?

## 2016-02-24 NOTE — Telephone Encounter (Signed)
You can use my verbal order for refill on Zofran 4 mg 1 hour before methotrexate and then every 8 hours thereafter if needed for nausea/vomiting.Dispense 30 pills with 2 refills

## 2016-02-25 ENCOUNTER — Encounter: Payer: Self-pay | Admitting: *Deleted

## 2016-02-25 MED ORDER — ONDANSETRON HCL 4 MG PO TABS: ORAL_TABLET | ORAL | 2 refills | Status: DC

## 2016-02-25 NOTE — Telephone Encounter (Signed)
Prescription sent to the pharmacy and patient notified via my chart as request by patient.

## 2016-02-26 ENCOUNTER — Encounter: Payer: 59 | Admitting: Family Medicine

## 2016-02-26 DIAGNOSIS — Z0289 Encounter for other administrative examinations: Secondary | ICD-10-CM

## 2016-02-26 NOTE — Progress Notes (Deleted)
Office Visit Note  Patient: Robert Shea             Date of Birth: 01/22/1955           MRN: [id number]             PCP: Robert Bush, MD Referring: Robert Bush, MD Visit Date: 03/11/2016 Occupation: @GUAROCC @    Subjective:  No chief complaint on file.   History of Present Illness: Robert Shea is a 61 y.o. male ***   Activities of Daily Living:  Patient reports morning stiffness for *** {minute/hour:19697}.   Patient {ACTIONS;DENIES/REPORTS:21021675::"Denies"} nocturnal pain.  Difficulty dressing/grooming: {ACTIONS;DENIES/REPORTS:21021675::"Denies"} Difficulty climbing stairs: {ACTIONS;DENIES/REPORTS:21021675::"Denies"} Difficulty getting out of chair: {ACTIONS;DENIES/REPORTS:21021675::"Denies"} Difficulty using hands for taps, buttons, cutlery, and/or writing: {ACTIONS;DENIES/REPORTS:21021675::"Denies"}   No Rheumatology ROS completed.   PMFS History:  Patient Active Problem List   Diagnosis Date Noted  . Lesion of lip 12/25/2015  . High risk medication use 10/28/2015  . Osteoarthritis of both hands 10/28/2015  . Primary osteoarthritis of hips, bilateral 10/28/2015  . Neck pain 10/25/2015  . Milia 10/25/2015  . OSA on CPAP 10/25/2015  . Thoracic back pain 06/11/2015  . Dyspnea on exertion 02/26/2015  . PMR (polymyalgia rheumatica) (HCC) 01/30/2014  . Pain in joint, upper arm 01/01/2014  . Low back pain radiating to right lower extremity 01/01/2014  . Erectile dysfunction 09/19/2013  . Osteoarthritis of feet, bilateral 07/26/2013  . Hyperglycemia 03/25/2013  . Obesity, Class I, BMI 30-34.9   . Health care maintenance 03/15/2013  . Carotid stenosis 03/15/2013  . Gout   . HTN (hypertension)   . HLD (hyperlipidemia)   . Hypothyroidism   . History of colon polyps   . Ex-smoker     Past Medical History:  Diagnosis Date  . Ex-smoker 2009   quit  . Gout   . Heart murmur longstanding  . History of colon polyps 2010   benign  . HLD  (hyperlipidemia)   . HTN (hypertension) 2010  . Hypothyroidism 2010  . Obesity   . PMR (polymyalgia rheumatica) ([place]) 01/30/2014    Family History  Problem Relation Age of Onset  . Cancer Father 57    bone?  . Cancer Maternal Uncle     throat, smoker  . Diabetes Mother   . Diabetes Brother   . CAD Paternal Grandfather 103    MI  . Stroke Paternal Grandfather   . Hypertension Brother   . CAD Brother 51    widowmaker  . Colon cancer Neg Hx   . Pancreatic cancer Neg Hx   . Stomach cancer Neg Hx   . Rectal cancer Neg Hx    Past Surgical History:  Procedure Laterality Date  . COLONOSCOPY  07/2009   2 adenomas rpt 5 yrs Robert Shea)  . COLONOSCOPY  05/2013   1 hyperplastic polyp, rpt 10 yrs Robert Shea)  . VASECTOMY  1995   Social History   Social History Narrative   Lives with wife   [place] time [place]   Occ: VP of sales   Edu: BS   Activity: gym 3x/wk    Diet: healthier recently - good water, fruits/vegetables daily     Objective: Vital Signs: There were no vitals taken for this visit.   Physical Exam   Musculoskeletal Exam: ***  CDAI Exam: No CDAI exam completed.    Investigation: Findings:  02/06/2014 We obtained x-rays of bilateral hands 2 views today which showed only PIP and  DIP narrowing, no MCP joint involvement or intracarpal joint involvement was noted.  Bilateral feet 2 x-rays, 2 views were obtained which showed inferior and posterior calcaneal spur and dorsal spur with PIP and DIP narrowing consistent with osteoarthritis.  Bilateral hip joint x-rays were within normal limits without any joint space narrowing.   Labs from July 24, 2014, show CBC with diff is normal except for slightly low hemoglobin at 12.2, slightly low hematocrit at 35.9.  CMP is normal.  Labs from May 30, 2014, show uric acid at 4.8, sed rate at 20, which is normal.  TB Gold is negative.    Labs from 06/11/2015 show elevated glucose at 147.  CMP with GFR is normal.   CBC with diff is normal.  Uric acid is 5.3 and is in the desirable range.  Office Visit on 02/17/2016  Component Date Value Ref Range Status  . Hgb A1c MFr Bld 02/17/2016 5.8  4.6 - 6.5 % Final  . Sodium 02/17/2016 140  135 - 145 mEq/L Final  . Potassium 02/17/2016 4.2  3.5 - 5.1 mEq/L Final  . Chloride 02/17/2016 105  96 - 112 mEq/L Final  . CO2 02/17/2016 29  19 - 32 mEq/L Final  . Glucose, Bld 02/17/2016 141* 70 - 99 mg/dL Final  . BUN 02/17/2016 14  6 - 23 mg/dL Final  . Creatinine, Ser 02/17/2016 0.99  0.40 - 1.50 mg/dL Final  . Total Bilirubin 02/17/2016 0.6  0.2 - 1.2 mg/dL Final  . Alkaline Phosphatase 02/17/2016 65  39 - 117 U/L Final  . AST 02/17/2016 33  0 - 37 U/L Final  . ALT 02/17/2016 41  0 - 53 U/L Final  . Total Protein 02/17/2016 7.0  6.0 - 8.3 g/dL Final  . Albumin 02/17/2016 4.6  3.5 - 5.2 g/dL Final  . Calcium 02/17/2016 9.6  8.4 - 10.5 mg/dL Final  . GFR 02/17/2016 82.04  >60.00 mL/min Final  . WBC 02/17/2016 5.2  4.0 - 10.5 K/uL Final  . RBC 02/17/2016 4.06* 4.22 - 5.81 Mil/uL Final  . Hemoglobin 02/17/2016 13.1  13.0 - 17.0 g/dL Final  . HCT 02/17/2016 38.2* 39.0 - 52.0 % Final  . MCV 02/17/2016 94.2  78.0 - 100.0 fl Final  . MCHC 02/17/2016 34.3  30.0 - 36.0 g/dL Final  . RDW 02/17/2016 13.6  11.5 - 15.5 % Final  . Platelets 02/17/2016 236.0  150.0 - 400.0 K/uL Final  . Neutrophils Relative % 02/17/2016 66.8  43.0 - 77.0 % Final  . Lymphocytes Relative 02/17/2016 23.2  12.0 - 46.0 % Final  . Monocytes Relative 02/17/2016 7.1  3.0 - 12.0 % Final  . Eosinophils Relative 02/17/2016 2.0  0.0 - 5.0 % Final  . Basophils Relative 02/17/2016 0.9  0.0 - 3.0 % Final  . Neutro Abs 02/17/2016 3.5  1.4 - 7.7 K/uL Final  . Lymphs Abs 02/17/2016 1.2  0.7 - 4.0 K/uL Final  . Monocytes Absolute 02/17/2016 0.4  0.1 - 1.0 K/uL Final  . Eosinophils Absolute 02/17/2016 0.1  0.0 - 0.7 K/uL Final  . Basophils Absolute 02/17/2016 0.0  0.0 - 0.1 K/uL Final  . VITD 02/17/2016  19.61* 30.00 - 100.00 ng/mL Final  . Uric Acid, Serum 02/17/2016 4.9  4.0 - 7.8 mg/dL Final     Imaging: No results found.  Speciality Comments: No specialty comments available.    Procedures:  No procedures performed Allergies: Uloric [febuxostat]   Assessment / Plan:  Visit Diagnoses: PMR (polymyalgia rheumatica) (HCC)  Neck pain  Ex-smoker  Idiopathic chronic gout of multiple sites without tophus  Primary osteoarthritis of both feet  Primary osteoarthritis of both hands  Chronic midline thoracic back pain  History of hyperlipidemia  Primary osteoarthritis of hips, bilateral  High risk medication use  History of hypertension    Orders: No orders of the defined types were placed in this encounter.  No orders of the defined types were placed in this encounter.   Face-to-face time spent with patient was *** minutes. 50% of time was spent in counseling and coordination of care.  Follow-Up Instructions: No Follow-up on file.   Kydan Shanholtzer, RT  Note - This record has been created using [employer].  Chart creation errors have been sought, but may not always  have been located. Such creation errors do not reflect on  the standard of medical care.

## 2016-02-28 ENCOUNTER — Ambulatory Visit: Payer: Managed Care, Other (non HMO) | Admitting: Family Medicine

## 2016-02-28 ENCOUNTER — Telehealth: Payer: Self-pay | Admitting: Rheumatology

## 2016-02-28 NOTE — Telephone Encounter (Signed)
I spoke to patient/ he has been using MTX for a year, he states dizziness fatigue headache have persisted for greater than a week. I have advised him the symptoms persisting for 7 days is very concerning and I want him to see his primary care ASAP for this. He will d/c the MTX anyway, in case it is causing his symptoms.

## 2016-02-28 NOTE — Telephone Encounter (Signed)
Please, call to check on Pt on Monday.

## 2016-02-28 NOTE — Telephone Encounter (Signed)
Patient states he is taking the MTX and is having headache, nausea, fatigue and dizziness. He states the symptoms started the day after he took his MTX. Patient states he is due to go out of town for vacation. He has Zofran which is helping with the nausea. He would like to know what will if he does not take his MTX for the next two weeks. He does not want to be sick while on vacation out the country. He would like some advise. He would like to know if there is another medication that may work better for him.

## 2016-02-28 NOTE — Telephone Encounter (Signed)
Patient called stating that he is not feeling well after taking his medication. . .said he has fallen twice and is dizzy and light headed.  Also very nauseated! Please call patient. Cb#[phone].  Thank you

## 2016-03-02 NOTE — Telephone Encounter (Signed)
I called him to see how he is feeling left message for him to call us back

## 2016-03-02 NOTE — Telephone Encounter (Signed)
Patient states he is not feeling better at all. He has called his PCP for this, but they were unable to get him in. He did state he went to the Urgent care and then went to the ER. He has had EKG/ labs and was told he does not have the flu. He was told symptoms may be the MTX. He states he also has a rash on his chest. The ER has given him an antibiotic for the rash. He states he is going out of the country soon, will not resume the Methotrexate until he gets home.     He appreciates the call, will be in touch if he needs anything.

## 2016-03-11 ENCOUNTER — Ambulatory Visit: Payer: Managed Care, Other (non HMO) | Admitting: Rheumatology

## 2016-03-13 ENCOUNTER — Other Ambulatory Visit: Payer: Self-pay | Admitting: Family Medicine

## 2016-03-16 ENCOUNTER — Other Ambulatory Visit: Payer: Self-pay

## 2016-03-16 NOTE — Telephone Encounter (Signed)
Pt left v/m requesting refill phentermine. Last refilled # 30 x 2 on 12/02/15; last annual 02/25/15; pt request cb when refilled and will schedule CPX. Please advise.

## 2016-03-17 MED ORDER — PHENTERMINE HCL 30 MG PO CAPS: 30.0000 mg | ORAL_CAPSULE | ORAL | 0 refills | Status: DC

## 2016-03-17 NOTE — Telephone Encounter (Signed)
Rx called in as prescribed 

## 2016-03-17 NOTE — Telephone Encounter (Signed)
Px written for call in   Refilling times one in pcp absence

## 2016-03-23 NOTE — Progress Notes (Signed)
Office Visit Note  Patient: Robert Shea             Date of Birth: Aug 06, 1955           MRN: [id number]             PCP: Ria Bush, MD Referring: Ria Bush, MD Visit Date: 03/26/2016 Occupation: @GUAROCC @    Subjective:  Pain right hand   History of Present Illness: Robert Shea is a 61 y.o. male with history of polymyalgia rheumatica and gout. According to patient about a month ago he got a new prescription of methotrexate from a new pharmacy. He states after taking methotrexate he felt nauseated the following day and also was very tired and achy. The symptoms lingered on for another week. The following weekend he skipped his methotrexate but he was not feeling well. He also developed a rash on his chest after using a tanning bed. He went to the emergency room where after evaluation he was given IV antibiotics followed by oral antibiotics. He states gradually the symptoms got better and resolved. He has not taken methotrexate since then as he was traveling and was concerned that he may have recurrence of symptoms. He was on vacation recently and forgot his [place] . He developed a flare of gout in his right hand which is a still swollen. The swelling is on his right second and third MCP joint. He has not started the [place] yet.  Activities of Daily Living:  Patient reports morning stiffness for 10 minutes.   Patient Denies nocturnal pain.  Difficulty dressing/grooming: Denies Difficulty climbing stairs: Denies Difficulty getting out of chair: Denies Difficulty using hands for taps, buttons, cutlery, and/or writing: Reports   Review of Systems  Constitutional: Positive for fatigue. Negative for night sweats and weakness ( ).  HENT: Negative for mouth sores, mouth dryness and nose dryness.   Eyes: Negative for redness and dryness.  Respiratory: Negative for shortness of breath and difficulty breathing.   Cardiovascular: Negative for chest pain, palpitations,  hypertension, irregular heartbeat and swelling in legs/feet.  Gastrointestinal: Negative for constipation and diarrhea.  Endocrine: Negative for increased urination.  Musculoskeletal: Positive for arthralgias, joint pain, joint swelling and morning stiffness. Negative for myalgias, muscle weakness, muscle tenderness and myalgias.  Skin: Negative for color change, rash, hair loss, nodules/bumps, skin tightness, ulcers and sensitivity to sunlight.  Allergic/Immunologic: Negative for susceptible to infections.  Neurological: Negative for dizziness, fainting, memory loss and night sweats.  Hematological: Negative for swollen glands.  Psychiatric/Behavioral: Negative for depressed mood and sleep disturbance. The patient is not nervous/anxious.     PMFS History:  Patient Active Problem List   Diagnosis Date Noted  . History of hypertension 03/23/2016  . History of hypothyroidism 03/23/2016  . Lesion of lip 12/25/2015  . High risk medication use 10/28/2015  . Osteoarthritis of both hands 10/28/2015  . Primary osteoarthritis of hips, bilateral 10/28/2015  . Neck pain 10/25/2015  . [place] 10/25/2015  . OSA on CPAP 10/25/2015  . Thoracic back pain 06/11/2015  . Dyspnea on exertion 02/26/2015  . PMR (polymyalgia rheumatica) (HCC) 01/30/2014  . Pain in joint, upper arm 01/01/2014  . Low back pain radiating to right lower extremity 01/01/2014  . Erectile dysfunction 09/19/2013  . Osteoarthritis of feet, bilateral 07/26/2013  . Hyperglycemia 03/25/2013  . Obesity, Class I, BMI 30-34.9   . Health care maintenance 03/15/2013  . Carotid stenosis 03/15/2013  . Gout   . HTN (hypertension)   .  HLD (hyperlipidemia)   . Hypothyroidism   . History of colon polyps   . Ex-smoker     Past Medical History:  Diagnosis Date  . Ex-smoker 2009   quit  . Gout   . Heart murmur longstanding  . History of colon polyps 2010   benign  . HLD (hyperlipidemia)   . HTN (hypertension) 2010  . Hypothyroidism  2010  . Obesity   . PMR (polymyalgia rheumatica) ([place]) 01/30/2014    Family History  Problem Relation Age of Onset  . Cancer Father 69    bone?  . Cancer Maternal Uncle     throat, smoker  . Diabetes Mother   . Diabetes Brother   . CAD Paternal Grandfather 51    MI  . Stroke Paternal Grandfather   . Hypertension Brother   . CAD Brother 70    widowmaker  . Colon cancer Neg Hx   . Pancreatic cancer Neg Hx   . Stomach cancer Neg Hx   . Rectal cancer Neg Hx    Past Surgical History:  Procedure Laterality Date  . COLONOSCOPY  07/2009   2 adenomas rpt 5 yrs Collie Siad)  . COLONOSCOPY  05/2013   1 hyperplastic polyp, rpt 10 yrs Ardis Hughs)  . [place] History   Social History Narrative   Lives with wife   [place] time lives [place] part time local   Occ: VP of sales   Edu: BS   Activity: gym 3x/wk    Diet: healthier recently - good water, fruits/vegetables daily     Objective: Vital Signs: BP (!) 142/77   Pulse 88   Resp 13   Ht 6\' 3"  (1.905 m)   Wt 260 lb (117.9 kg)   BMI 32.50 kg/m    Physical Exam  Constitutional: He is oriented to person, place, and time. He appears well-developed and well-nourished.  HENT:  Head: Normocephalic and atraumatic.  Eyes: Conjunctivae and EOM are normal. Pupils are equal, round, and reactive to light.  Neck: Normal range of motion. Neck supple.  Cardiovascular: Normal rate, regular rhythm and normal heart sounds.   Pulmonary/Chest: Effort normal and breath sounds normal.  Abdominal: Soft. Bowel sounds are normal.  Neurological: He is alert and oriented to person, place, and time.  Skin: Skin is warm and dry. Capillary refill takes less than 2 seconds.  Psychiatric: He has a normal mood and affect. His behavior is normal.  Nursing note and vitals reviewed.    Musculoskeletal Exam: C-spine, thoracic, lumbar spine good range of motion. Shoulder joints, elbow joints, wrist joints are good range of motion. He has swelling  over his right second MCP joint. He also has some nodulocystic over his bilateral elbows. Hip joints knee joints ankles MTPs PIPs DIPs were all good range of motion with no swelling.  CDAI Exam: No CDAI exam completed.    Investigation: No additional findings. Office Visit on 02/17/2016  Component Date Value Ref Range Status  . Hgb A1c MFr Bld 02/17/2016 5.8  4.6 - 6.5 % Final  . Sodium 02/17/2016 140  135 - 145 mEq/L Final  . Potassium 02/17/2016 4.2  3.5 - 5.1 mEq/L Final  . Chloride 02/17/2016 105  96 - 112 mEq/L Final  . CO2 02/17/2016 29  19 - 32 mEq/L Final  . Glucose, Bld 02/17/2016 141* 70 - 99 mg/dL Final  . BUN 02/17/2016 14  6 - 23 mg/dL Final  . Creatinine, Ser 02/17/2016 0.99  0.40 - 1.50 mg/dL Final  . Total Bilirubin 02/17/2016 0.6  0.2 - 1.2 mg/dL Final  . Alkaline Phosphatase 02/17/2016 65  39 - 117 U/L Final  . AST 02/17/2016 33  0 - 37 U/L Final  . ALT 02/17/2016 41  0 - 53 U/L Final  . Total Protein 02/17/2016 7.0  6.0 - 8.3 g/dL Final  . Albumin 02/17/2016 4.6  3.5 - 5.2 g/dL Final  . Calcium 02/17/2016 9.6  8.4 - 10.5 mg/dL Final  . GFR 02/17/2016 82.04  >60.00 mL/min Final  . WBC 02/17/2016 5.2  4.0 - 10.5 K/uL Final  . RBC 02/17/2016 4.06* 4.22 - 5.81 Mil/uL Final  . Hemoglobin 02/17/2016 13.1  13.0 - 17.0 g/dL Final  . HCT 02/17/2016 38.2* 39.0 - 52.0 % Final  . MCV 02/17/2016 94.2  78.0 - 100.0 fl Final  . MCHC 02/17/2016 34.3  30.0 - 36.0 g/dL Final  . RDW 02/17/2016 13.6  11.5 - 15.5 % Final  . Platelets 02/17/2016 236.0  150.0 - 400.0 K/uL Final  . Neutrophils Relative % 02/17/2016 66.8  43.0 - 77.0 % Final  . Lymphocytes Relative 02/17/2016 23.2  12.0 - 46.0 % Final  . Monocytes Relative 02/17/2016 7.1  3.0 - 12.0 % Final  . Eosinophils Relative 02/17/2016 2.0  0.0 - 5.0 % Final  . Basophils Relative 02/17/2016 0.9  0.0 - 3.0 % Final  . Neutro Abs 02/17/2016 3.5  1.4 - 7.7 K/uL Final  . Lymphs Abs 02/17/2016 1.2  0.7 - 4.0 K/uL Final  . Monocytes  Absolute 02/17/2016 0.4  0.1 - 1.0 K/uL Final  . Eosinophils Absolute 02/17/2016 0.1  0.0 - 0.7 K/uL Final  . Basophils Absolute 02/17/2016 0.0  0.0 - 0.1 K/uL Final  . VITD 02/17/2016 19.61* 30.00 - 100.00 ng/mL Final  . Uric Acid, Serum 02/17/2016 4.9  4.0 - 7.8 mg/dL Final     Imaging: No results found.  Speciality Comments: No specialty comments available.    Procedures:  No procedures performed Allergies: Uloric [febuxostat]   Assessment / Plan:     Visit Diagnoses: PMR (polymyalgia rheumatica) ([place]): Patient has been off prednisone for over 6 months now. He does not have any muscular weakness or tenderness.  Recent episode of increased fatigue, nausea and flulike symptoms with the rash after the last methotrexate injection a month ago. We had detailed discussion regarding that I believe he probably had some form of viral illness which eventually resolved. He is willing to try methotrexate dose again and see how he feels. I also offered possible use of subcutaneous methotrexate. At this point he wants to stay on oral methotrexate.  High risk medication use - Methotrexate 10 mg by mouth every week, folic acid 1 mg by mouth daily. Labs have been stable we'll check his labs again in 3 months.  Idiopathic chronic gout of multiple sites without tophus - Allopurinol 300 mg by mouth daily, Mitigare 0.6 mg twice a day when necessary. He is having a flare right now as he forgot his Mitigare. He states he did not also eat appropriately while he was on vacation.  Primary osteoarthritis of both hands minimal stiffness  Primary osteoarthritis of hips, bilateral  Primary osteoarthritis of both feet: Chronic pain  His other medical problems are listed as follows:  Chronic midline thoracic back pain  Ex-smoker  History of hypertension  History of hypothyroidism  Essential hypertension  Mixed hyperlipidemia  Hyperglycemia  Obesity, Class I, BMI 30-34.9  Orders: No orders  of the defined types were placed in this encounter.  No orders of the defined types were placed in this encounter.   Face-to-face time spent with patient was 30 minutes. 50% of time was spent in counseling and coordination of care.  Follow-Up Instructions: Return in about 5 months (around 08/26/2016) for PMR, Gout.   Bo Merino, MD  Note - This record has been created using Editor, commissioning.  Chart creation errors have been sought, but may not always  have been located. Such creation errors do not reflect on  the standard of medical care.

## 2016-03-25 ENCOUNTER — Other Ambulatory Visit: Payer: Self-pay | Admitting: Rheumatology

## 2016-03-25 NOTE — Telephone Encounter (Signed)
Last Visit: 10/29/15 Next Visit: 03/26/16 Labs: 02/17/16 Low Vit D(treated)    Faythe Ghee to refill MTX?

## 2016-03-25 NOTE — Telephone Encounter (Signed)
ok 

## 2016-03-26 ENCOUNTER — Ambulatory Visit (INDEPENDENT_AMBULATORY_CARE_PROVIDER_SITE_OTHER): Payer: Managed Care, Other (non HMO) | Admitting: Rheumatology

## 2016-03-26 ENCOUNTER — Encounter: Payer: Self-pay | Admitting: Rheumatology

## 2016-03-26 VITALS — BP 142/77 | HR 88 | Resp 13 | Ht 75.0 in | Wt 260.0 lb

## 2016-03-26 DIAGNOSIS — Z79899 Other long term (current) drug therapy: Secondary | ICD-10-CM | POA: Diagnosis not present

## 2016-03-26 DIAGNOSIS — M19042 Primary osteoarthritis, left hand: Secondary | ICD-10-CM

## 2016-03-26 DIAGNOSIS — M1A09X0 Idiopathic chronic gout, multiple sites, without tophus (tophi): Secondary | ICD-10-CM

## 2016-03-26 DIAGNOSIS — I10 Essential (primary) hypertension: Secondary | ICD-10-CM | POA: Diagnosis not present

## 2016-03-26 DIAGNOSIS — Z87891 Personal history of nicotine dependence: Secondary | ICD-10-CM | POA: Diagnosis not present

## 2016-03-26 DIAGNOSIS — E782 Mixed hyperlipidemia: Secondary | ICD-10-CM

## 2016-03-26 DIAGNOSIS — E669 Obesity, unspecified: Secondary | ICD-10-CM

## 2016-03-26 DIAGNOSIS — M353 Polymyalgia rheumatica: Secondary | ICD-10-CM | POA: Diagnosis not present

## 2016-03-26 DIAGNOSIS — E66811 Obesity, class 1: Secondary | ICD-10-CM

## 2016-03-26 DIAGNOSIS — M542 Cervicalgia: Secondary | ICD-10-CM | POA: Diagnosis not present

## 2016-03-26 DIAGNOSIS — Z8639 Personal history of other endocrine, nutritional and metabolic disease: Secondary | ICD-10-CM

## 2016-03-26 DIAGNOSIS — M546 Pain in thoracic spine: Secondary | ICD-10-CM

## 2016-03-26 DIAGNOSIS — M19041 Primary osteoarthritis, right hand: Secondary | ICD-10-CM

## 2016-03-26 DIAGNOSIS — M160 Bilateral primary osteoarthritis of hip: Secondary | ICD-10-CM | POA: Diagnosis not present

## 2016-03-26 DIAGNOSIS — G8929 Other chronic pain: Secondary | ICD-10-CM

## 2016-03-26 DIAGNOSIS — M19071 Primary osteoarthritis, right ankle and foot: Secondary | ICD-10-CM

## 2016-03-26 DIAGNOSIS — R739 Hyperglycemia, unspecified: Secondary | ICD-10-CM

## 2016-03-26 DIAGNOSIS — Z8679 Personal history of other diseases of the circulatory system: Secondary | ICD-10-CM

## 2016-03-26 DIAGNOSIS — M19072 Primary osteoarthritis, left ankle and foot: Secondary | ICD-10-CM

## 2016-03-26 NOTE — Patient Instructions (Signed)
Standing Labs We placed an order today for your standing lab work.    Please come back and get your standing labs in May and every 3 months Uric acid with next lab  We have open lab Monday through Friday from 8:30-11:30 AM and 1:30-4 PM at the office of Dr. Tresa Moore, PA.   The office is located at [address] [place], [place], [place] [address] No appointment is necessary.   Labs are drawn by [facility].  You may receive a bill from [facility] for your lab work.

## 2016-04-13 ENCOUNTER — Other Ambulatory Visit: Payer: Self-pay | Admitting: Family Medicine

## 2016-04-14 ENCOUNTER — Ambulatory Visit: Payer: Managed Care, Other (non HMO) | Admitting: Internal Medicine

## 2016-04-23 ENCOUNTER — Other Ambulatory Visit: Payer: Self-pay | Admitting: Family Medicine

## 2016-04-23 NOTE — Telephone Encounter (Signed)
Ok to refill? Last filled 03/17/16 #30 0 RF

## 2016-04-23 NOTE — Telephone Encounter (Signed)
plz phone in. 

## 2016-04-24 ENCOUNTER — Telehealth: Payer: Self-pay | Admitting: Radiology

## 2016-04-24 MED ORDER — MITIGARE 0.6 MG PO CAPS: 2.0000 | ORAL_CAPSULE | Freq: Every day | ORAL | 2 refills | Status: DC

## 2016-04-24 NOTE — Telephone Encounter (Signed)
03/26/16 last visit  08/25/16 next visit Labs WNL in feb 2018 Ok to refill per Dr Estanislado Pandy

## 2016-04-24 NOTE — Telephone Encounter (Signed)
Rx called in to requested pharmacy 

## 2016-04-24 NOTE — Telephone Encounter (Signed)
Refill request received via fax for colchicine [facility] [place] [facility]

## 2016-05-13 ENCOUNTER — Other Ambulatory Visit: Payer: Self-pay | Admitting: Family Medicine

## 2016-05-22 ENCOUNTER — Other Ambulatory Visit: Payer: Self-pay | Admitting: Family Medicine

## 2016-05-22 NOTE — Telephone Encounter (Signed)
Last refill 04/23/2016. Lat OV 02/17/16

## 2016-05-25 ENCOUNTER — Encounter: Payer: Self-pay | Admitting: Family Medicine

## 2016-05-25 ENCOUNTER — Ambulatory Visit (INDEPENDENT_AMBULATORY_CARE_PROVIDER_SITE_OTHER): Payer: Managed Care, Other (non HMO) | Admitting: Family Medicine

## 2016-05-25 VITALS — BP 140/76 | HR 68 | Temp 98.1°F | Ht 73.25 in | Wt 246.0 lb

## 2016-05-25 DIAGNOSIS — E782 Mixed hyperlipidemia: Secondary | ICD-10-CM | POA: Diagnosis not present

## 2016-05-25 DIAGNOSIS — Z0000 Encounter for general adult medical examination without abnormal findings: Secondary | ICD-10-CM

## 2016-05-25 DIAGNOSIS — M1A09X0 Idiopathic chronic gout, multiple sites, without tophus (tophi): Secondary | ICD-10-CM

## 2016-05-25 DIAGNOSIS — M150 Primary generalized (osteo)arthritis: Secondary | ICD-10-CM | POA: Diagnosis not present

## 2016-05-25 DIAGNOSIS — E559 Vitamin D deficiency, unspecified: Secondary | ICD-10-CM

## 2016-05-25 DIAGNOSIS — M353 Polymyalgia rheumatica: Secondary | ICD-10-CM | POA: Diagnosis not present

## 2016-05-25 DIAGNOSIS — Z87891 Personal history of nicotine dependence: Secondary | ICD-10-CM

## 2016-05-25 DIAGNOSIS — E039 Hypothyroidism, unspecified: Secondary | ICD-10-CM

## 2016-05-25 DIAGNOSIS — K130 Diseases of lips: Secondary | ICD-10-CM | POA: Diagnosis not present

## 2016-05-25 DIAGNOSIS — R739 Hyperglycemia, unspecified: Secondary | ICD-10-CM | POA: Diagnosis not present

## 2016-05-25 DIAGNOSIS — M159 Polyosteoarthritis, unspecified: Secondary | ICD-10-CM

## 2016-05-25 DIAGNOSIS — I10 Essential (primary) hypertension: Secondary | ICD-10-CM

## 2016-05-25 DIAGNOSIS — E669 Obesity, unspecified: Secondary | ICD-10-CM

## 2016-05-25 DIAGNOSIS — Z125 Encounter for screening for malignant neoplasm of prostate: Secondary | ICD-10-CM | POA: Diagnosis not present

## 2016-05-25 MED ORDER — PHENTERMINE HCL 30 MG PO CAPS: 30.0000 mg | ORAL_CAPSULE | Freq: Every morning | ORAL | 1 refills | Status: DC

## 2016-05-25 NOTE — Assessment & Plan Note (Signed)
Discussed healthy diet and lifestyle changes to affect sustainable weight loss. Congratulated on effort to date. Will refill phentermine x 2 more months but discussed discontinuing after 6 mo treatment course.

## 2016-05-25 NOTE — Assessment & Plan Note (Signed)
Chronic, compliant with 153mcg levothyroxine daily. Update TSH.

## 2016-05-25 NOTE — Assessment & Plan Note (Signed)
Chronic, stable. Continue current regimen. 

## 2016-05-25 NOTE — Assessment & Plan Note (Signed)
Congratulated on continued abstinence - quit 2009

## 2016-05-25 NOTE — Assessment & Plan Note (Signed)
Stable period on allopurinol 300mg  daily, with colchicine only PRN>

## 2016-05-25 NOTE — Assessment & Plan Note (Signed)
Check A1c. 

## 2016-05-25 NOTE — Progress Notes (Signed)
BP 140/76 (BP Location: Left Arm, Patient Position: Sitting, Cuff Size: Large)   Pulse 68   Temp 98.1 F (36.7 C) (Oral)   Ht 6' 1.25" (1.861 m)   Wt 246 lb (111.6 kg)   SpO2 98%   BMI 32.23 kg/m    CC: CPE Subjective:    Patient ID: Robert Shea, male    DOB: 25-Jun-1955, 61 y.o.   MRN: [id number]  HPI: Robert Shea is a 61 y.o. male presenting on 05/25/2016 for Annual Exam   Osteoarthritis, gout and PMR followed by rheumatology.   Obesity - working on weight loss through healthy diet and lifestyle changes. Has lost 20+ lbs over the past 3 months. Has been on phentermine regularly for last 3-4 months. Tolerates well - no chest pain, headaches or insomnia. Requests refill. He is biking routinely 20 miles at a time.   Preventative: COLONOSCOPY Date: 05/2013 1 hyperplastic polyp, rpt 10 yrs Ardis Hughs) Prostate - discussed, would like screening for now with PSA/DRE Lung cancer screening - will need to discuss at next CPE Flu - yearly  Tdap - 04/2014  Pneumovax 2016 shingrix - discussed - pt will check with rheum.  Seat belt use discussed  Sunscreen use discussed. No changing moles on skin. Sees derm yearly.  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 Interim medical history since our last visit reviewed. Allergies and medications reviewed and updated. Outpatient Medications Prior to Visit  Medication Sig Dispense Refill  . allopurinol (ZYLOPRIM) 300 MG tablet TAKE 1 TABLET BY MOUTH DAILY 90 tablet 1  . amLODipine (NORVASC) 10 MG tablet TAKE 1 TABLET BY MOUTH EVERY DAY 90 tablet 3  . benazepril (LOTENSIN) 10 MG tablet TAKE 1 TABLET (10 MG TOTAL)  BY MOUTH DAILY. 90 tablet 1  . cyclobenzaprine (FLEXERIL) 10 MG tablet TAKE 1/2 TO 1 TABLET BY MOUTH 2 TIMES DAILY AS NEEDED FOR MUSCLE SPASMS 30 tablet 3  . folic acid (FOLVITE) 1 MG tablet Take 1 mg by mouth daily.    Marland Kitchen levothyroxine (SYNTHROID, LEVOTHROID) 100 MCG tablet TAKE ONE TAB DAILY BEFORE BREAKFAST 30 tablet 3  . methotrexate (RHEUMATREX) 2.5 MG tablet TAKE 4 TABLETS (10 MG TOTAL) BY MOUTH ONCE A WEEK. 48 tablet 0  . ondansetron (ZOFRAN) 4 MG tablet Take 1 tablet 1 hour prior to Methotrexate and then every 8 hours thereafter as needed fro nausea and vomiting. 30 tablet 2  . simvastatin (ZOCOR) 20 MG tablet TAKE 1 TABLET BY MOUTH EVERY DAY 90 tablet 1  . VIAGRA 100 MG tablet TAKE 1/2 TO 1 TABLET BY MOUTH DAILY AS NEEDED FOR ERECTILE DYSFUNCTION 10 tablet 11  . MITIGARE 0.6 MG CAPS Take 2 tablets by mouth daily. Prn 60 capsule 2  . phentermine 30 MG capsule TAKE 1 CAPSULE EVERY MORNING 30 capsule 0  . Vitamin D, Ergocalciferol, (DRISDOL) 50000 units CAPS capsule Take 1 capsule (50,000 Units total) by mouth 2 (two) times a week. (Patient not taking: Reported on 03/26/2016) 24 capsule 0   No facility-administered medications prior to visit.      Per HPI unless specifically indicated in ROS section below Review of Systems  Constitutional:  Negative for activity change, appetite change, chills, fatigue, fever and unexpected weight change.  HENT: Negative for hearing loss.   Eyes: Negative for visual disturbance.  Respiratory: Negative for cough, chest tightness, shortness of breath and wheezing.   Cardiovascular: Negative for chest pain, palpitations and leg swelling.  Gastrointestinal: Positive for constipation. Negative for abdominal distention, abdominal pain, blood in stool, diarrhea, nausea and vomiting.  Genitourinary: Negative for difficulty urinating and hematuria.  Musculoskeletal: Negative for arthralgias, myalgias and neck pain.  Skin: Negative for rash.  Neurological:  Negative for dizziness, seizures, syncope and headaches.  Hematological: Negative for adenopathy. Does not bruise/bleed easily.  Psychiatric/Behavioral: Negative for dysphoric mood. The patient is not nervous/anxious.        Objective:    BP 140/76 (BP Location: Left Arm, Patient Position: Sitting, Cuff Size: Large)   Pulse 68   Temp 98.1 F (36.7 C) (Oral)   Ht 6' 1.25" (1.861 m)   Wt 246 lb (111.6 kg)   SpO2 98%   BMI 32.23 kg/m   Wt Readings from Last 3 Encounters:  05/25/16 246 lb (111.6 kg)  03/26/16 260 lb (117.9 kg)  02/17/16 265 lb 8 oz (120.4 kg)    Physical Exam  Constitutional: He is oriented to person, place, and time. He appears well-developed and well-nourished. No distress.  HENT:  Head: Normocephalic and atraumatic.  Right Ear: Hearing, tympanic membrane, external ear and ear canal normal.  Left Ear: Hearing, tympanic membrane, external ear and ear canal normal.  Nose: Nose normal.  Mouth/Throat: Uvula is midline, oropharynx is clear and moist and mucous membranes are normal. No oropharyngeal exudate, posterior oropharyngeal edema or posterior oropharyngeal erythema.  Eyes: Conjunctivae and EOM are normal. Pupils are equal, round, and reactive to light. No scleral icterus.  Neck: Normal range of motion. Neck supple. No thyromegaly present.  Cardiovascular: Normal rate, regular rhythm, normal heart sounds and intact distal pulses.   No murmur heard. Pulses:      Radial pulses are 2+ on the right side, and 2+ on the left side.  Pulmonary/Chest: Effort normal and breath sounds normal. No respiratory distress. He has no wheezes. He has no rales.  Abdominal: Soft. Bowel sounds are normal. He exhibits no distension and no mass. There is no tenderness. There is no rebound and no guarding.  Genitourinary: Rectum normal and prostate normal. Rectal exam shows no external hemorrhoid, no internal hemorrhoid, no fissure, no mass, no tenderness and anal tone normal. Prostate  is not enlarged (20gm) and not tender.  Musculoskeletal: Normal range of motion. He exhibits no edema.  Lymphadenopathy:    He has no cervical adenopathy.  Neurological: He is alert and oriented to person, place, and time.  CN grossly intact, station and gait intact  Skin: Skin is warm and dry. No rash noted.  Psychiatric: He has a normal mood and affect. His behavior is normal. Judgment and thought content normal.  Nursing note and vitals reviewed.  Results for orders placed or performed in visit on 02/17/16  Hemoglobin A1c  Result Value Ref Range   Hgb A1c MFr Bld 5.8 4.6 - 6.5 %  Comprehensive metabolic panel  Result Value Ref Range   Sodium 140 135 - 145 mEq/L   Potassium 4.2 3.5 - 5.1 mEq/L   Chloride 105 96 - 112 mEq/L   CO2 29 19 - 32 mEq/L   Glucose, Bld 141 (H) 70 - 99 mg/dL   BUN 14 6 - 23 mg/dL   Creatinine, Ser  0.99 0.40 - 1.50 mg/dL   Total Bilirubin 0.6 0.2 - 1.2 mg/dL   Alkaline Phosphatase 65 39 - 117 U/L   AST 33 0 - 37 U/L   ALT 41 0 - 53 U/L   Total Protein 7.0 6.0 - 8.3 g/dL   Albumin 4.6 3.5 - 5.2 g/dL   Calcium 9.6 8.4 - 10.5 mg/dL   GFR 82.04 >60.00 mL/min  CBC with Differential/Platelet  Result Value Ref Range   WBC 5.2 4.0 - 10.5 K/uL   RBC 4.06 (L) 4.22 - 5.81 Mil/uL   Hemoglobin 13.1 13.0 - 17.0 g/dL   HCT 38.2 (L) 39.0 - 52.0 %   MCV 94.2 78.0 - 100.0 fl   MCHC 34.3 30.0 - 36.0 g/dL   RDW 13.6 11.5 - 15.5 %   Platelets 236.0 150.0 - 400.0 K/uL   Neutrophils Relative % 66.8 43.0 - 77.0 %   Lymphocytes Relative 23.2 12.0 - 46.0 %   Monocytes Relative 7.1 3.0 - 12.0 %   Eosinophils Relative 2.0 0.0 - 5.0 %   Basophils Relative 0.9 0.0 - 3.0 %   Neutro Abs 3.5 1.4 - 7.7 K/uL   Lymphs Abs 1.2 0.7 - 4.0 K/uL   Monocytes Absolute 0.4 0.1 - 1.0 K/uL   Eosinophils Absolute 0.1 0.0 - 0.7 K/uL   Basophils Absolute 0.0 0.0 - 0.1 K/uL  VITAMIN D 25 Hydroxy (Vit-D Deficiency, Fractures)  Result Value Ref Range   VITD 19.61 (L) 30.00 - 100.00 ng/mL    Uric acid  Result Value Ref Range   Uric Acid, Serum 4.9 4.0 - 7.8 mg/dL      Assessment & Plan:   Problem List Items Addressed This Visit    Ex-smoker    Congratulated on continued abstinence - quit 2009      Gout    Stable period on allopurinol 300mg  daily, with colchicine only PRN>       Health care maintenance - Primary    Preventative protocols reviewed and updated unless pt declined. Discussed healthy diet and lifestyle.       HLD (hyperlipidemia)    Chronic, stable. Update FLP. Continues simvastatin.       Relevant Orders   Lipid panel   Comprehensive metabolic panel   HTN (hypertension)    Chronic, stable. Continue current regimen.       Hyperglycemia    Check A1c.       Relevant Orders   Hemoglobin A1c   Hypothyroidism    Chronic, compliant with 135mcg levothyroxine daily. Update TSH.       Relevant Orders   TSH   T4, free   Lesion of lip    Persistent. Has f/u planned with derm later this summer.      Obesity, Class I, BMI 30-34.9    Discussed healthy diet and lifestyle changes to affect sustainable weight loss. Congratulated on effort to date. Will refill phentermine x 2 more months but discussed discontinuing after 6 mo treatment course.       Relevant Medications   phentermine 30 MG capsule   Osteoarthritis of multiple joints    Appreciate rheum care.      Relevant Medications   MITIGARE 0.6 MG CAPS   PMR (polymyalgia rheumatica) (HCC)    Appreciate rheum care. Plan is MTX for 2 yrs off prednisone then stop.  Pt having trouble tolerating MTX.       Relevant Orders   CBC with Differential/Platelet    Other Visit Diagnoses  Vitamin D deficiency       Relevant Orders   VITAMIN D 25 Hydroxy (Vit-D Deficiency, Fractures)   Special screening for malignant neoplasm of prostate       Relevant Orders   PSA       Follow up plan: Return in about 6 months (around 11/25/2016) for follow up visit.  Ria Bush, MD

## 2016-05-25 NOTE — Assessment & Plan Note (Signed)
Chronic, stable. Update FLP. Continues simvastatin.

## 2016-05-25 NOTE — Telephone Encounter (Signed)
appt today

## 2016-05-25 NOTE — Patient Instructions (Addendum)
Check with rheum about shingrix new shingles 2 shot series.  Labs today Keep up the healthy diet and lifestyle! You are doing well today. Return as needed or in 6 months for f/u visit  Health Maintenance, Male A healthy lifestyle and preventive care is important for your health and wellness. Ask your health care provider about what schedule of regular examinations is right for you. What should I know about weight and diet?  Eat a Healthy Diet  Eat plenty of vegetables, fruits, whole grains, low-fat dairy products, and lean protein.  Do not eat a lot of foods high in solid fats, added sugars, or salt. Maintain a Healthy Weight  Regular exercise can help you achieve or maintain a healthy weight. You should:  Do at least 150 minutes of exercise each week. The exercise should increase your heart rate and make you sweat (moderate-intensity exercise).  Do strength-training exercises at least twice a week. Watch Your Levels of Cholesterol and Blood Lipids  Have your blood tested for lipids and cholesterol every 5 years starting at 60 years of age. If you are at high risk for heart disease, you should start having your blood tested when you are 60 years old. You may need to have your cholesterol levels checked more often if:  Your lipid or cholesterol levels are high.  You are older than 60 years of age.  You are at high risk for heart disease. What should I know about cancer screening? Many types of cancers can be detected early and may often be prevented. Lung Cancer  You should be screened every year for lung cancer if:  You are a current smoker who has smoked for at least 30 years.  You are a former smoker who has quit within the past 15 years.  Talk to your health care provider about your screening options, when you should start screening, and how often you should be screened. Colorectal Cancer  Routine colorectal cancer screening usually begins at 60 years of age and should be  repeated every 5-10 years until you are 60 years old. You may need to be screened more often if early forms of precancerous polyps or small growths are found. Your health care provider may recommend screening at an earlier age if you have risk factors for colon cancer.  Your health care provider may recommend using home test kits to check for hidden blood in the stool.  A small camera at the end of a tube can be used to examine your colon (sigmoidoscopy or colonoscopy). This checks for the earliest forms of colorectal cancer. Prostate and Testicular Cancer  Depending on your age and overall health, your health care provider may do certain tests to screen for prostate and testicular cancer.  Talk to your health care provider about any symptoms or concerns you have about testicular or prostate cancer. Skin Cancer  Check your skin from head to toe regularly.  Tell your health care provider about any new moles or changes in moles, especially if:  There is a change in a mole's size, shape, or color.  You have a mole that is larger than a pencil eraser.  Always use sunscreen. Apply sunscreen liberally and repeat throughout the day.  Protect yourself by wearing long sleeves, pants, a wide-brimmed hat, and sunglasses when outside. What should I know about heart disease, diabetes, and high blood pressure?  If you are 21-87 years of age, have your blood pressure checked every 3-5 years. If you are 40  years of age or older, have your blood pressure checked every year. You should have your blood pressure measured twice-once when you are at a hospital or clinic, and once when you are not at a hospital or clinic. Record the average of the two measurements. To check your blood pressure when you are not at a hospital or clinic, you can use:  An automated blood pressure machine at a pharmacy.  A home blood pressure monitor.  Talk to your health care provider about your target blood pressure.  If you  are between 46-30 years old, ask your health care provider if you should take aspirin to prevent heart disease.  Have regular diabetes screenings by checking your fasting blood sugar level.  If you are at a normal weight and have a low risk for diabetes, have this test once every three years after the age of 72.  If you are overweight and have a high risk for diabetes, consider being tested at a younger age or more often.  A one-time screening for abdominal aortic aneurysm (AAA) by ultrasound is recommended for men aged 42-75 years who are current or former smokers. What should I know about preventing infection? Hepatitis B  If you have a higher risk for hepatitis B, you should be screened for this virus. Talk with your health care provider to find out if you are at risk for hepatitis B infection. Hepatitis C  Blood testing is recommended for:  Everyone born from [birth year] through 1964.  Anyone with known risk factors for hepatitis C. Sexually Transmitted Diseases (STDs)  You should be screened each year for STDs including gonorrhea and chlamydia if:  You are sexually active and are younger than 59 years of age.  You are older than 60 years of age and your health care provider tells you that you are at risk for this type of infection.  Your sexual activity has changed since you were last screened and you are at an increased risk for chlamydia or gonorrhea. Ask your health care provider if you are at risk.  Talk with your health care provider about whether you are at high risk of being infected with HIV. Your health care provider may recommend a prescription medicine to help prevent HIV infection. What else can I do?  Schedule regular health, dental, and eye exams.  Stay current with your vaccines (immunizations).  Do not use any tobacco products, such as cigarettes, chewing tobacco, and e-cigarettes. If you need help quitting, ask your health care provider.  Limit alcohol intake to no  more than 2 drinks per day. One drink equals 12 ounces of beer, 5 ounces of wine, or 1 ounces of hard liquor.  Do not use street drugs.  Do not share needles.  Ask your health care provider for help if you need support or information about quitting drugs.  Tell your health care provider if you often feel depressed.  Tell your health care provider if you have ever been abused or do not feel safe at home. This information is not intended to replace advice given to you by your health care provider. Make sure you discuss any questions you have with your health care provider. Document Released: 06/20/2007 Document Revised: 08/21/2015 Document Reviewed: 09/25/2014 Elsevier Interactive Patient Education  2017 [employer].

## 2016-05-25 NOTE — Assessment & Plan Note (Signed)
Preventative protocols reviewed and updated unless pt declined. Discussed healthy diet and lifestyle.  

## 2016-05-25 NOTE — Assessment & Plan Note (Signed)
Appreciate rheum care.  

## 2016-05-25 NOTE — Assessment & Plan Note (Signed)
Persistent. Has f/u planned with derm later this summer.

## 2016-05-25 NOTE — Assessment & Plan Note (Signed)
Appreciate rheum care. Plan is MTX for 2 yrs off prednisone then stop.  Pt having trouble tolerating MTX.

## 2016-05-26 ENCOUNTER — Other Ambulatory Visit: Payer: Self-pay | Admitting: Family Medicine

## 2016-05-26 LAB — CBC WITH DIFFERENTIAL/PLATELET
BASOS PCT: 1.1 % (ref 0.0–3.0)
Basophils Absolute: 0.1 10*3/uL (ref 0.0–0.1)
EOS ABS: 0.2 10*3/uL (ref 0.0–0.7)
EOS PCT: 2.1 % (ref 0.0–5.0)
HCT: 42.5 % (ref 39.0–52.0)
Hemoglobin: 13.9 g/dL (ref 13.0–17.0)
LYMPHS ABS: 1.7 10*3/uL (ref 0.7–4.0)
Lymphocytes Relative: 22.6 % (ref 12.0–46.0)
MCHC: 32.8 g/dL (ref 30.0–36.0)
MCV: 97.9 fl (ref 78.0–100.0)
MONO ABS: 0.5 10*3/uL (ref 0.1–1.0)
Monocytes Relative: 6.8 % (ref 3.0–12.0)
NEUTROS PCT: 67.4 % (ref 43.0–77.0)
Neutro Abs: 5 10*3/uL (ref 1.4–7.7)
PLATELETS: 265 10*3/uL (ref 150.0–400.0)
RBC: 4.34 Mil/uL (ref 4.22–5.81)
RDW: 15.1 % (ref 11.5–15.5)
WBC: 7.5 10*3/uL (ref 4.0–10.5)

## 2016-05-26 LAB — COMPREHENSIVE METABOLIC PANEL
ALT: 27 U/L (ref 0–53)
AST: 25 U/L (ref 0–37)
Albumin: 4.9 g/dL (ref 3.5–5.2)
Alkaline Phosphatase: 72 U/L (ref 39–117)
BILIRUBIN TOTAL: 0.8 mg/dL (ref 0.2–1.2)
BUN: 16 mg/dL (ref 6–23)
CALCIUM: 9.9 mg/dL (ref 8.4–10.5)
CO2: 30 mEq/L (ref 19–32)
Chloride: 102 mEq/L (ref 96–112)
Creatinine, Ser: 0.98 mg/dL (ref 0.40–1.50)
GFR: 82.93 mL/min (ref >60.00)
GLUCOSE: 110 mg/dL — AB (ref 70–99)
POTASSIUM: 4.3 meq/L (ref 3.5–5.1)
Sodium: 141 mEq/L (ref 135–145)
Total Protein: 7.2 g/dL (ref 6.0–8.3)

## 2016-05-26 LAB — VITAMIN D 25 HYDROXY (VIT D DEFICIENCY, FRACTURES): VITD: 38.28 ng/mL (ref 30.00–100.00)

## 2016-05-26 LAB — LIPID PANEL
CHOL/HDL RATIO: 2
Cholesterol: 144 mg/dL (ref 0–200)
HDL: 65 mg/dL (ref >39.00)
LDL CALC: 60 mg/dL (ref 0–99)
NONHDL: 79.3
TRIGLYCERIDES: 99 mg/dL (ref 0.0–149.0)
VLDL: 19.8 mg/dL (ref 0.0–40.0)

## 2016-05-26 LAB — HEMOGLOBIN A1C: Hgb A1c MFr Bld: 5.6 % (ref 4.6–6.5)

## 2016-05-26 LAB — PSA: PSA: 1.79 ng/mL (ref 0.10–4.00)

## 2016-05-26 LAB — TSH: TSH: 2.52 u[IU]/mL (ref 0.35–4.50)

## 2016-05-26 LAB — T4, FREE: FREE T4: 0.83 ng/dL (ref 0.60–1.60)

## 2016-05-26 MED ORDER — VITAMIN D3 25 MCG (1000 UT) PO CAPS
1.0000 | ORAL_CAPSULE | Freq: Every day | ORAL | Status: DC
Start: 2016-05-26 — End: 2017-08-12

## 2016-06-02 ENCOUNTER — Other Ambulatory Visit: Payer: Self-pay | Admitting: Rheumatology

## 2016-06-02 ENCOUNTER — Telehealth: Payer: Self-pay

## 2016-06-02 NOTE — Telephone Encounter (Signed)
Pt left v/m requesting cb about refill for Vit that was denied; appears denied by rheumatology; left v/m requesting cb from pt.

## 2016-06-03 NOTE — Telephone Encounter (Signed)
Pt called back and pt voiced understanding that rheumatology denied the weekly Vit D. Advised pt from lab result Dr Darnell Level recommended taking Vit D 1000 units po OTC daily in place of wkly dose. Pt voiced understanding and will pick up OTC.

## 2016-06-21 ENCOUNTER — Other Ambulatory Visit: Payer: Self-pay | Admitting: Family Medicine

## 2016-06-26 ENCOUNTER — Other Ambulatory Visit: Payer: Self-pay | Admitting: *Deleted

## 2016-06-26 MED ORDER — FOLIC ACID 1 MG PO TABS: 2.0000 mg | ORAL_TABLET | Freq: Every day | ORAL | 3 refills | Status: DC

## 2016-06-26 NOTE — Telephone Encounter (Signed)
Refill request received via fax  Last Visit: 03/26/16 Next Visit: 08/25/16  Okay to refill Folic Acid?

## 2016-06-26 NOTE — Telephone Encounter (Signed)
ok 

## 2016-07-01 ENCOUNTER — Other Ambulatory Visit: Payer: Self-pay | Admitting: Rheumatology

## 2016-07-01 NOTE — Telephone Encounter (Signed)
Last Visit: 03/26/16 Next Visit:  08/25/16 Labs: 05/25/16 cbc/cmp wnl  Okay to refill per Dr. Estanislado Pandy

## 2016-07-19 ENCOUNTER — Other Ambulatory Visit: Payer: Self-pay | Admitting: Rheumatology

## 2016-07-20 NOTE — Telephone Encounter (Signed)
Last Visit: 03/26/16 Next Visit:  08/25/16 Labs: 05/25/16 cbc/cmp wnl  Okay to refill per Dr. Estanislado Pandy

## 2016-07-31 ENCOUNTER — Encounter: Payer: Self-pay | Admitting: Family Medicine

## 2016-08-02 NOTE — Telephone Encounter (Signed)
Pt requests printed copy of viagra Rx. Ok to do. Will cc lab.

## 2016-08-04 ENCOUNTER — Other Ambulatory Visit (INDEPENDENT_AMBULATORY_CARE_PROVIDER_SITE_OTHER): Payer: Managed Care, Other (non HMO)

## 2016-08-04 DIAGNOSIS — Z79899 Other long term (current) drug therapy: Secondary | ICD-10-CM

## 2016-08-04 DIAGNOSIS — M1A09X0 Idiopathic chronic gout, multiple sites, without tophus (tophi): Secondary | ICD-10-CM

## 2016-08-04 LAB — CBC WITH DIFFERENTIAL/PLATELET
BASOS ABS: 0 {cells}/uL (ref 0–200)
Basophils Relative: 0 %
EOS ABS: 232 {cells}/uL (ref 15–500)
EOS PCT: 4 %
HCT: 41 % (ref 38.5–50.0)
HEMOGLOBIN: 13.7 g/dL (ref 13.2–17.1)
LYMPHS ABS: 1624 {cells}/uL (ref 850–3900)
Lymphocytes Relative: 28 %
MCH: 32.7 pg (ref 27.0–33.0)
MCHC: 33.4 g/dL (ref 32.0–36.0)
MCV: 97.9 fL (ref 80.0–100.0)
MPV: 9.8 fL (ref 7.5–12.5)
Monocytes Absolute: 406 cells/uL (ref 200–950)
Monocytes Relative: 7 %
NEUTROS ABS: 3538 {cells}/uL (ref 1500–7800)
Neutrophils Relative %: 61 %
PLATELETS: 239 10*3/uL (ref 140–400)
RBC: 4.19 MIL/uL — ABNORMAL LOW (ref 4.20–5.80)
RDW: 13.4 % (ref 11.0–15.0)
WBC: 5.8 10*3/uL (ref 3.8–10.8)

## 2016-08-05 LAB — URIC ACID: Uric Acid, Serum: 3.8 mg/dL — ABNORMAL LOW (ref 4.0–8.0)

## 2016-08-05 NOTE — Progress Notes (Signed)
Uric acid in desirable range. CBC normal.

## 2016-08-25 ENCOUNTER — Other Ambulatory Visit: Payer: Self-pay

## 2016-08-25 ENCOUNTER — Ambulatory Visit: Payer: Managed Care, Other (non HMO) | Admitting: Rheumatology

## 2016-08-25 NOTE — Telephone Encounter (Signed)
Pt has rx for Viagra that he wants sent to [facility] instead of [facility]. I advised him to call [facility] and have them transfer the rx. Call me if there is an issue.

## 2016-09-23 ENCOUNTER — Other Ambulatory Visit: Payer: Self-pay | Admitting: Family Medicine

## 2016-10-06 DIAGNOSIS — R768 Other specified abnormal immunological findings in serum: Secondary | ICD-10-CM | POA: Insufficient documentation

## 2016-10-06 NOTE — Progress Notes (Deleted)
Office Visit Note  Patient: Robert Shea             Date of Birth: May 15, 1955           MRN: [id number]             PCP: Ria Bush, MD Referring: Ria Bush, MD Visit Date: 10/14/2016 Occupation: @GUAROCC @    Subjective:  No chief complaint on file.   History of Present Illness: Robert Shea is a 61 y.o. male ***   Activities of Daily Living:  Patient reports morning stiffness for *** {minute/hour:19697}.   Patient {ACTIONS;DENIES/REPORTS:21021675::"Denies"} nocturnal pain.  Difficulty dressing/grooming: {ACTIONS;DENIES/REPORTS:21021675::"Denies"} Difficulty climbing stairs: {ACTIONS;DENIES/REPORTS:21021675::"Denies"} Difficulty getting out of chair: {ACTIONS;DENIES/REPORTS:21021675::"Denies"} Difficulty using hands for taps, buttons, cutlery, and/or writing: {ACTIONS;DENIES/REPORTS:21021675::"Denies"}   No Rheumatology ROS completed.   PMFS History:  Patient Active Problem List   Diagnosis Date Noted  . Rheumatoid factor positive 10/06/2016  . Lesion of lip 12/25/2015  . High risk medication use 10/28/2015  . Osteoarthritis of both hands 10/28/2015  . Osteoarthritis of multiple joints 10/28/2015  . Milia 10/25/2015  . OSA on CPAP 10/25/2015  . Thoracic back pain 06/11/2015  . PMR (polymyalgia rheumatica) (HCC) 01/30/2014  . Low back pain radiating to right lower extremity 01/01/2014  . Erectile dysfunction 09/19/2013  . Osteoarthritis of feet, bilateral 07/26/2013  . Hyperglycemia 03/25/2013  . Obesity, Class I, BMI 30-34.9   . Health care maintenance 03/15/2013  . Carotid stenosis 03/15/2013  . Gout   . HTN (hypertension)   . HLD (hyperlipidemia)   . Hypothyroidism   . History of colon polyps   . Ex-smoker     Past Medical History:  Diagnosis Date  . Ex-smoker 2009   quit  . Gout   . Heart murmur longstanding  . History of colon polyps 2010   benign  . HLD (hyperlipidemia)   . HTN (hypertension) 2010  . Hypothyroidism 2010  .  Obesity   . PMR (polymyalgia rheumatica) ([place]) 01/30/2014    Family History  Problem Relation Age of Onset  . Cancer Father 67       bone?  . Cancer Maternal Uncle        throat, smoker  . Diabetes Mother   . Diabetes Brother   . CAD Paternal Grandfather 29       MI  . Stroke Paternal Grandfather   . Hypertension Brother   . CAD Brother 70       widowmaker  . Colon cancer Neg Hx   . Pancreatic cancer Neg Hx   . Stomach cancer Neg Hx   . Rectal cancer Neg Hx    Past Surgical History:  Procedure Laterality Date  . COLONOSCOPY  07/2009   2 adenomas rpt 5 yrs Collie Siad)  . COLONOSCOPY  05/2013   1 hyperplastic polyp, rpt 10 yrs Ardis Hughs)  . VASECTOMY  1995   Social History   Social History Narrative   Lives with wife   [place] time lives [place] part time local   Occ: VP of sales   Edu: BS   Activity: gym 3x/wk    Diet: healthier recently - good water, fruits/vegetables daily     Objective: Vital Signs: There were no vitals taken for this visit.   Physical Exam   Musculoskeletal Exam: ***  CDAI Exam: No CDAI exam completed.    Investigation: Findings:    Labs from May 30, 2014, show uric acid at 4.8, sed rate at 20, which  is normal.  TB Gold is negative.  Hep A IgM reactive, Hep B surface Ag Negative, Hep B core IgM non reactive, HCV Ab negative, 06/07/2014 Hep A total Ab non reactive  Dr Danise Mina cleared patient for immunosuppressants.    CBC Latest Ref Rng & Units 08/04/2016 05/25/2016 02/17/2016  WBC 3.8 - 10.8 K/uL 5.8 7.5 5.2  Hemoglobin 13.2 - 17.1 g/dL 13.7 13.9 13.1  Hematocrit 38.5 - 50.0 % 41.0 42.5 38.2(L)  Platelets 140 - 400 K/uL 239 265.0 236.0   CMP Latest Ref Rng & Units 05/25/2016 02/17/2016 10/15/2015  Glucose 70 - 99 mg/dL 110(H) 141(H) 142(H)  BUN 6 - 23 mg/dL 16 14 14   Creatinine 0.40 - 1.50 mg/dL 0.98 0.99 1.04  Sodium 135 - 145 mEq/L 141 140 140  Potassium 3.5 - 5.1 mEq/L 4.3 4.2 4.2  Chloride 96 - 112 mEq/L 102 105 102  CO2 19 - 32 mEq/L  30 29 29   Calcium 8.4 - 10.5 mg/dL 9.9 9.6 9.8  Total Protein 6.0 - 8.3 g/dL 7.2 7.0 7.1  Total Bilirubin 0.2 - 1.2 mg/dL 0.8 0.6 0.5  Alkaline Phos 39 - 117 U/L 72 65 68  AST 0 - 37 U/L 25 33 25  ALT 0 - 53 U/L 27 41 41   Imaging: No results found.  Speciality Comments: No specialty comments available.    Procedures:  No procedures performed Allergies: Uloric [febuxostat]   Assessment / Plan:     Visit Diagnoses: PMR (polymyalgia rheumatica) (HCC)  High risk medication use - Methotrexate   Rheumatoid factor positive  Primary osteoarthritis of both hands  Primary osteoarthritis of both knees  Primary osteoarthritis of both feet  Idiopathic chronic gout of multiple sites without tophus  History of hypothyroidism  History of hypertension  History of hyperlipidemia  Left carotid bruit  History of sleep apnea  Former smoker  Obesity, Class I, BMI 30-34.9    Orders: No orders of the defined types were placed in this encounter.  No orders of the defined types were placed in this encounter.   Face-to-face time spent with patient was *** minutes. 50% of time was spent in counseling and coordination of care.  Follow-Up Instructions: No Follow-up on file.   Saeed Toren, RT  Note - This record has been created using [employer].  Chart creation errors have been sought, but may not always  have been located. Such creation errors do not reflect on  the standard of medical care.

## 2016-10-14 ENCOUNTER — Ambulatory Visit: Payer: Managed Care, Other (non HMO) | Admitting: Rheumatology
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
## 2016-10-25 ENCOUNTER — Other Ambulatory Visit: Payer: Self-pay | Admitting: Family Medicine

## 2016-10-28 ENCOUNTER — Encounter: Payer: Self-pay | Admitting: Rheumatology

## 2016-10-28 ENCOUNTER — Ambulatory Visit (INDEPENDENT_AMBULATORY_CARE_PROVIDER_SITE_OTHER): Payer: No Typology Code available for payment source | Admitting: Rheumatology

## 2016-10-28 VITALS — BP 145/62 | HR 64 | Ht 75.0 in | Wt 249.0 lb

## 2016-10-28 DIAGNOSIS — M1A09X0 Idiopathic chronic gout, multiple sites, without tophus (tophi): Secondary | ICD-10-CM

## 2016-10-28 DIAGNOSIS — M19071 Primary osteoarthritis, right ankle and foot: Secondary | ICD-10-CM

## 2016-10-28 DIAGNOSIS — G8929 Other chronic pain: Secondary | ICD-10-CM

## 2016-10-28 DIAGNOSIS — R739 Hyperglycemia, unspecified: Secondary | ICD-10-CM

## 2016-10-28 DIAGNOSIS — M19072 Primary osteoarthritis, left ankle and foot: Secondary | ICD-10-CM

## 2016-10-28 DIAGNOSIS — M19041 Primary osteoarthritis, right hand: Secondary | ICD-10-CM

## 2016-10-28 DIAGNOSIS — M19042 Primary osteoarthritis, left hand: Secondary | ICD-10-CM

## 2016-10-28 DIAGNOSIS — M353 Polymyalgia rheumatica: Secondary | ICD-10-CM

## 2016-10-28 DIAGNOSIS — Z87891 Personal history of nicotine dependence: Secondary | ICD-10-CM

## 2016-10-28 DIAGNOSIS — E782 Mixed hyperlipidemia: Secondary | ICD-10-CM

## 2016-10-28 DIAGNOSIS — Z79899 Other long term (current) drug therapy: Secondary | ICD-10-CM

## 2016-10-28 DIAGNOSIS — M160 Bilateral primary osteoarthritis of hip: Secondary | ICD-10-CM

## 2016-10-28 DIAGNOSIS — M546 Pain in thoracic spine: Secondary | ICD-10-CM

## 2016-10-28 DIAGNOSIS — Z8639 Personal history of other endocrine, nutritional and metabolic disease: Secondary | ICD-10-CM

## 2016-10-28 DIAGNOSIS — Z8679 Personal history of other diseases of the circulatory system: Secondary | ICD-10-CM

## 2016-10-28 LAB — CBC WITH DIFFERENTIAL/PLATELET
BASOS ABS: 29 {cells}/uL (ref 0–200)
BASOS PCT: 0.4 %
EOS ABS: 110 {cells}/uL (ref 15–500)
Eosinophils Relative: 1.5 %
HEMATOCRIT: 40.9 % (ref 38.5–50.0)
HEMOGLOBIN: 13.9 g/dL (ref 13.2–17.1)
LYMPHS ABS: 1621 {cells}/uL (ref 850–3900)
MCH: 32.3 pg (ref 27.0–33.0)
MCHC: 34 g/dL (ref 32.0–36.0)
MCV: 94.9 fL (ref 80.0–100.0)
MONOS PCT: 8.4 %
MPV: 10 fL (ref 7.5–12.5)
NEUTROS ABS: 4928 {cells}/uL (ref 1500–7800)
Neutrophils Relative %: 67.5 %
Platelets: 258 10*3/uL (ref 140–400)
RBC: 4.31 10*6/uL (ref 4.20–5.80)
RDW: 12.2 % (ref 11.0–15.0)
Total Lymphocyte: 22.2 %
WBC: 7.3 10*3/uL (ref 3.8–10.8)
WBCMIX: 613 {cells}/uL (ref 200–950)

## 2016-10-28 LAB — COMPLETE METABOLIC PANEL WITH GFR
AG Ratio: 2 (calc) (ref 1.0–2.5)
ALBUMIN MSPROF: 4.9 g/dL (ref 3.6–5.1)
ALKALINE PHOSPHATASE (APISO): 69 U/L (ref 40–115)
ALT: 25 U/L (ref 9–46)
AST: 29 U/L (ref 10–35)
BUN: 19 mg/dL (ref 7–25)
CHLORIDE: 103 mmol/L (ref 98–110)
CO2: 27 mmol/L (ref 20–32)
Calcium: 9.5 mg/dL (ref 8.6–10.3)
Creat: 1.08 mg/dL (ref 0.70–1.25)
GFR, EST AFRICAN AMERICAN: 86 mL/min/{1.73_m2} (ref >=60)
GFR, Est Non African American: 74 mL/min/{1.73_m2} (ref >=60)
GLOBULIN: 2.4 g/dL (ref 1.9–3.7)
GLUCOSE: 90 mg/dL (ref 65–99)
Potassium: 4.5 mmol/L (ref 3.5–5.3)
SODIUM: 139 mmol/L (ref 135–146)
TOTAL PROTEIN: 7.3 g/dL (ref 6.1–8.1)
Total Bilirubin: 0.7 mg/dL (ref 0.2–1.2)

## 2016-10-28 NOTE — Progress Notes (Signed)
wnl

## 2016-10-28 NOTE — Patient Instructions (Signed)
Standing Labs We placed an order today for your standing lab work.    Please come back and get your standing labs in February and every 4 months Uric acid in February  We have open lab Monday through Friday from 8:30-11:30 AM and 1:30-4 PM at the office of Dr. Bo Merino.   The office is located at [address], [place], [place], [place] [address] No appointment is necessary.   Labs are drawn by [facility].  You may receive a bill from [facility] for your lab work. If you have any questions regarding directions or hours of operation,  please call [phone].

## 2016-11-05 ENCOUNTER — Other Ambulatory Visit: Payer: Self-pay | Admitting: Family Medicine

## 2016-11-17 ENCOUNTER — Other Ambulatory Visit: Payer: Self-pay | Admitting: Rheumatology

## 2016-11-17 NOTE — Telephone Encounter (Signed)
Last visit: 10/28/16 Next Visit due in April 2019. Message sent to the front to schedule patient.  Labs: 10/28/16 WNL  Okay to refill per Dr. Estanislado Pandy

## 2016-12-06 ENCOUNTER — Encounter: Payer: Self-pay | Admitting: Family Medicine

## 2016-12-15 ENCOUNTER — Encounter: Payer: Self-pay | Admitting: Family Medicine

## 2016-12-16 MED ORDER — PHENTERMINE HCL 30 MG PO CAPS: 30.0000 mg | ORAL_CAPSULE | Freq: Every morning | ORAL | 1 refills | Status: DC

## 2016-12-16 NOTE — Telephone Encounter (Signed)
phentermine refilled.

## 2016-12-16 NOTE — Telephone Encounter (Signed)
I never received this message - pt sent another email today asking about it 10 days later. Can you help me figure out why it wasn't sent to me? Thanks Responded in other [employer].

## 2016-12-20 ENCOUNTER — Other Ambulatory Visit: Payer: Self-pay | Admitting: Family Medicine

## 2016-12-24 ENCOUNTER — Telehealth: Payer: Self-pay | Admitting: Rheumatology

## 2016-12-24 NOTE — Telephone Encounter (Signed)
-----   Message from Carole Binning, LPN sent at 03/47/4259  8:36 AM EST ----- Regarding: Please schedule patient a follow up visit.  Please schedule patient a follow up visit. Patient due April 2019. Thanks!

## 2016-12-24 NOTE — Telephone Encounter (Signed)
I LMOM for patient to schedule rov for April 2019.

## 2017-01-04 ENCOUNTER — Other Ambulatory Visit: Payer: Self-pay | Admitting: Rheumatology

## 2017-01-04 NOTE — Telephone Encounter (Signed)
Last visit: 10/28/16 Next Visit: 04/08/17 Labs: 10/28/16 WNL  Okay to refill per Dr. Estanislado Pandy

## 2017-01-05 ENCOUNTER — Other Ambulatory Visit: Payer: Self-pay | Admitting: Family Medicine

## 2017-01-13 ENCOUNTER — Telehealth: Payer: Self-pay | Admitting: Family Medicine

## 2017-01-13 NOTE — Telephone Encounter (Signed)
Pt last annual 05/25/16; pt was to return in 6 mths for f/u no appt scheduled.Please advise.

## 2017-01-13 NOTE — Telephone Encounter (Signed)
Copied from [place] [phone]. Topic: Quick Communication - See Telephone Encounter >> Jan 13, 2017 10:15 AM Ivar Drape wrote: CRM for notification. See Telephone encounter for:  01/13/17. Patient was told by previous doctor that he had a Hernia and not to do any stomach exercises. He wants to do some kind of stomach exercise.  Please advise what he can do. He want's Dr. Bosie Clos opinion.

## 2017-01-15 NOTE — Telephone Encounter (Signed)
I think he should be able to do core strengthening exercises - google "[facility] core exercises" for a good start.  Use pain as guide - if back pain starts, back off exercises.

## 2017-01-15 NOTE — Telephone Encounter (Signed)
Left message on vm per dpr relaying message per Dr. G.  

## 2017-01-21 ENCOUNTER — Other Ambulatory Visit: Payer: Self-pay | Admitting: Family Medicine

## 2017-01-21 NOTE — Telephone Encounter (Signed)
Per Epic IT one of the CMAs retrieved the message on 12/3 and marked it as "no further action needed."  Im not sure why this happened other than accidental oversite on the part of a busy CMA.  We will remind them to be careful when retrieving messages for other providers to ensure nothing gets missed.

## 2017-02-12 ENCOUNTER — Other Ambulatory Visit: Payer: Self-pay | Admitting: Rheumatology

## 2017-02-12 NOTE — Telephone Encounter (Addendum)
Last visit: 10/28/16 Next Visit: 04/08/17 Labs: 10/28/16 WNL  Left message to advise patient needs to update labs.  Okay to refill 30 day supply per Dr. Estanislado Pandy

## 2017-02-15 ENCOUNTER — Other Ambulatory Visit: Payer: Self-pay | Admitting: Family Medicine

## 2017-02-15 NOTE — Telephone Encounter (Signed)
Last filled:  01/19/17, #30 Last OV (CPE):  05/25/16 Next OV:  none

## 2017-02-15 NOTE — Telephone Encounter (Signed)
started 12/2016

## 2017-03-03 ENCOUNTER — Other Ambulatory Visit: Payer: Self-pay | Admitting: Rheumatology

## 2017-03-03 NOTE — Telephone Encounter (Signed)
Last visit: 10/28/2016 Next visit: 04/08/2017 Labs: 10/28/2016 WNL  Uric acid: 08/04/2016 3.8  Okay to refill per Dr. Estanislado Pandy.

## 2017-03-17 ENCOUNTER — Other Ambulatory Visit: Payer: Self-pay | Admitting: Rheumatology

## 2017-03-17 NOTE — Telephone Encounter (Addendum)
Last visit: 10/28/2016 Next visit: 04/08/2017 Labs: 08/04/16 WNL  Left message to advise patient he is due for labs. Patient has PMR.  Okay to refill 30 day supply MTX?

## 2017-03-17 NOTE — Telephone Encounter (Signed)
Cannot refill methotrexate until he gets labs.

## 2017-03-21 ENCOUNTER — Other Ambulatory Visit: Payer: Self-pay | Admitting: Rheumatology

## 2017-03-24 ENCOUNTER — Other Ambulatory Visit: Payer: Self-pay | Admitting: Family Medicine

## 2017-03-24 NOTE — Telephone Encounter (Signed)
Last filled:  02/16/17, #30 Last OV (CPE):  05/25/16 Next OV:  none

## 2017-03-25 NOTE — Telephone Encounter (Signed)
E prescribed No further refills without office visit.

## 2017-03-26 NOTE — Progress Notes (Signed)
Office Visit Note  Patient: Robert Shea             Date of Birth: 1955/03/24           MRN: [id number]             PCP: Ria Bush, MD Referring: Ria Bush, MD Visit Date: 04/08/2017 Occupation: @GUAROCC @    Subjective:  Other (right elbow/hand pain and stiffness )   History of Present Illness: Robert Shea is a 62 y.o. male with history of polymyalgia rheumatica and gout.  He has been having discomfort in his right elbow for the last 6 months.  He has been having some stiffness in his bilateral hands.  He denies any gout flare.  He has been off prednisone for 2 years now.  He denies any muscle weakness or tenderness.  Activities of Daily Living:  Patient reports morning stiffness for 2 minutes.   Patient Reports nocturnal pain.  Difficulty dressing/grooming: Denies Difficulty climbing stairs: Denies Difficulty getting out of chair: Denies Difficulty using hands for taps, buttons, cutlery, and/or writing: Denies   Review of Systems  Constitutional: Negative for fatigue and night sweats.  HENT: Negative for mouth sores, mouth dryness and nose dryness.   Eyes: Negative for redness and dryness.  Respiratory: Negative for shortness of breath and difficulty breathing.   Cardiovascular: Negative for chest pain, palpitations, hypertension, irregular heartbeat and swelling in legs/feet.  Gastrointestinal: Positive for constipation. Negative for diarrhea.  Endocrine: Negative for increased urination.  Musculoskeletal: Positive for arthralgias, joint pain and morning stiffness. Negative for joint swelling, myalgias, muscle weakness, muscle tenderness and myalgias.  Skin: Negative for color change, rash, hair loss, nodules/bumps, skin tightness, ulcers and sensitivity to sunlight.  Allergic/Immunologic: Negative for susceptible to infections.  Neurological: Negative for dizziness, fainting, memory loss, night sweats and weakness ( ).  Hematological: Negative for  swollen glands.  Psychiatric/Behavioral: Positive for sleep disturbance. Negative for depressed mood. The patient is not nervous/anxious.     PMFS History:  Patient Active Problem List   Diagnosis Date Noted  . Rheumatoid factor positive 10/06/2016  . Lesion of lip 12/25/2015  . High risk medication use 10/28/2015  . Osteoarthritis of both hands 10/28/2015  . Osteoarthritis of multiple joints 10/28/2015  . Milia 10/25/2015  . OSA on CPAP 10/25/2015  . Thoracic back pain 06/11/2015  . PMR (polymyalgia rheumatica) ([facility]) 01/30/2014  . Low back pain radiating to right lower extremity 01/01/2014  . Erectile dysfunction 09/19/2013  . Osteoarthritis of feet, bilateral 07/26/2013  . Hyperglycemia 03/25/2013  . Obesity, Class I, BMI 30-34.9   . Health care maintenance 03/15/2013  . Carotid stenosis 03/15/2013  . Gout   . HTN (hypertension)   . HLD (hyperlipidemia)   . Hypothyroidism   . History of colon polyps   . Ex-smoker     Past Medical History:  Diagnosis Date  . Ex-smoker 2009   quit  . Gout   . Heart murmur longstanding  . History of colon polyps 2010   benign  . HLD (hyperlipidemia)   . HTN (hypertension) 2010  . Hypothyroidism 2010  . Obesity   . PMR (polymyalgia rheumatica) ([place]) 01/30/2014    Family History  Problem Relation Age of Onset  . Cancer Father 38       bone?  . Cancer Maternal Uncle        throat, smoker  . Diabetes Mother   . Diabetes Brother   . CAD Paternal Grandfather 94  MI  . Stroke Paternal Grandfather   . CAD Brother 74       widowmaker  . Diabetes Brother   . Healthy Son   . Colon cancer Neg Hx   . Pancreatic cancer Neg Hx   . Stomach cancer Neg Hx   . Rectal cancer Neg Hx    Past Surgical History:  Procedure Laterality Date  . COLONOSCOPY  07/2009   2 adenomas rpt 5 yrs Collie Siad)  . COLONOSCOPY  05/2013   1 hyperplastic polyp, rpt 10 yrs Ardis Hughs)  . [place] History   Social History Narrative   Lives with  wife   [place] time [place]   Occ: VP of sales   Edu: BS   Activity: gym 3x/wk    Diet: healthier recently - good water, fruits/vegetables daily     Objective: Vital Signs: BP (!) 148/70 (BP Location: Right Arm, Patient Position: Sitting, Cuff Size: Normal)   Pulse 67   Resp 17   Ht 6' 2.5" (1.892 m)   Wt 260 lb (117.9 kg)   BMI 32.94 kg/m    Physical Exam  Constitutional: He is oriented to person, place, and time. He appears well-developed and well-nourished.  HENT:  Head: Normocephalic and atraumatic.  Eyes: Pupils are equal, round, and reactive to light. Conjunctivae and EOM are normal.  Neck: Normal range of motion. Neck supple.  Cardiovascular: Normal rate, regular rhythm and normal heart sounds.  Pulmonary/Chest: Effort normal and breath sounds normal.  Abdominal: Soft. Bowel sounds are normal.  Neurological: He is alert and oriented to person, place, and time.  Skin: Skin is warm and dry. Capillary refill takes less than 2 seconds.  Psychiatric: He has a normal mood and affect. His behavior is normal.  Nursing note and vitals reviewed.    Musculoskeletal Exam: C-spine limited range of motion, shoulder joints elbow joints wrist joints were in good range of motion.  He has tenderness on palpation over right lateral epicondyle, he has DIP and PIP thickening with no synovitis.  Hip joints knee joints ankles were in good range of motion with no synovitis.  He had no muscular weakness or tenderness on examination today.  CDAI Exam: No CDAI exam completed.    Investigation: No additional findings.Uric acid: 04/02/17 6.2 CBC Latest Ref Rng & Units 04/02/2017 10/28/2016 08/04/2016  WBC 3.8 - 10.8 Thousand/uL 6.4 7.3 5.8  Hemoglobin 13.2 - 17.1 g/dL 13.9 13.9 13.7  Hematocrit 38.5 - 50.0 % 40.2 40.9 41.0  Platelets 140 - 400 Thousand/uL 266 258 239   CMP Latest Ref Rng & Units 04/02/2017 10/28/2016 05/25/2016  Glucose 65 - 99 mg/dL 92 90 110(H)  BUN  7 - 25 mg/dL 15 19 16   Creatinine 0.70 - 1.25 mg/dL 0.98 1.08 0.98  Sodium 135 - 146 mmol/L 142 139 141  Potassium 3.5 - 5.3 mmol/L 4.3 4.5 4.3  Chloride 98 - 110 mmol/L 105 103 102  CO2 20 - 32 mmol/L 29 27 30   Calcium 8.6 - 10.3 mg/dL 10.0 9.5 9.9  Total Protein 6.1 - 8.1 g/dL 7.3 7.3 7.2  Total Bilirubin 0.2 - 1.2 mg/dL 0.7 0.7 0.8  Alkaline Phos 39 - 117 U/L - - 72  AST 10 - 35 U/L 22 29 25   ALT 9 - 46 U/L 22 25 27     Imaging: No results found.  Speciality Comments: No specialty comments available.    Procedures:  No procedures performed  Allergies: Uloric [febuxostat]   Assessment / Plan:     Visit Diagnoses: PMR (polymyalgia rheumatica) ([place]): He has been off prednisone for almost 2 years now.  He has been on low-dose methotrexate.  Without any recurrence.  We discussed coming off methotrexate now.  And will observe.  The risk of recurrence of PMR was discussed.  Patient is supposed to notify me if he develops any new symptoms.  High risk medication use - Methotrexate 4 tabs by mouth every week, folic acid 1 mg by mouth daily - Plan: CBC with Differential/Platelet, COMPLETE METABOLIC PANEL WITH GFR his labs have been stable.  Idiopathic chronic gout of multiple sites without tophus - Allopurinol 300 mg by mouth daily, Mitigare 0.6 mg twice a day when necessaryUric acid: 6.2 in March 2019.  He states he been taking allopurinol on a regular basis but he has not been monitoring his diet as well.  He will try to monitor his diet more closely.- Plan: Uric acid in 6 months prior to next visit.  Lateral epicondylitis of right elbow: He does fishing which could be contributing to his lateral epicondylitis.  I have given her a handout on exercises.  Topical NSAIDs can be used as well.  Primary osteoarthritis of both hands: He does have osteoarthritis in his hands.  Joint protection muscle strengthening was discussed.  Primary osteoarthritis of both hips: Not symptomatic  Primary  osteoarthritis of both feet: Not symptomatic  Other medical problems are listed as follows:  History of hypertension  History of obesity  History of hyperlipidemia  History of hypothyroidism  History of hyperglycemia  Ex-smoker    Orders: Orders Placed This Encounter  Procedures  . CBC with Differential/Platelet  . COMPLETE METABOLIC PANEL WITH GFR  . Uric acid   No orders of the defined types were placed in this encounter.  Follow-Up Instructions: Return in about 6 months (around 10/08/2017) for PMR Gout.   Bo Merino, MD  Note - This record has been created using Editor, commissioning.  Chart creation errors have been sought, but may not always  have been located. Such creation errors do not reflect on  the standard of medical care.

## 2017-04-01 ENCOUNTER — Other Ambulatory Visit: Payer: Self-pay | Admitting: Rheumatology

## 2017-04-01 NOTE — Telephone Encounter (Signed)
Last visit: 10/28/2016 Next visit: 04/08/2017 Labs: 10/28/16 WNL  Okay to refill per Dr. Estanislado Pandy

## 2017-04-02 ENCOUNTER — Other Ambulatory Visit (INDEPENDENT_AMBULATORY_CARE_PROVIDER_SITE_OTHER): Payer: No Typology Code available for payment source

## 2017-04-02 DIAGNOSIS — M1A09X0 Idiopathic chronic gout, multiple sites, without tophus (tophi): Secondary | ICD-10-CM | POA: Diagnosis not present

## 2017-04-02 DIAGNOSIS — Z79899 Other long term (current) drug therapy: Secondary | ICD-10-CM

## 2017-04-03 LAB — COMPLETE METABOLIC PANEL WITH GFR
AG RATIO: 1.8 (calc) (ref 1.0–2.5)
ALBUMIN MSPROF: 4.7 g/dL (ref 3.6–5.1)
ALKALINE PHOSPHATASE (APISO): 82 U/L (ref 40–115)
ALT: 22 U/L (ref 9–46)
AST: 22 U/L (ref 10–35)
BUN: 15 mg/dL (ref 7–25)
CO2: 29 mmol/L (ref 20–32)
CREATININE: 0.98 mg/dL (ref 0.70–1.25)
Calcium: 10 mg/dL (ref 8.6–10.3)
Chloride: 105 mmol/L (ref 98–110)
GFR, Est African American: 97 mL/min/{1.73_m2} (ref >=60)
GFR, Est Non African American: 83 mL/min/{1.73_m2} (ref >=60)
GLOBULIN: 2.6 g/dL (ref 1.9–3.7)
Glucose, Bld: 92 mg/dL (ref 65–99)
Potassium: 4.3 mmol/L (ref 3.5–5.3)
SODIUM: 142 mmol/L (ref 135–146)
Total Bilirubin: 0.7 mg/dL (ref 0.2–1.2)
Total Protein: 7.3 g/dL (ref 6.1–8.1)

## 2017-04-03 LAB — CBC WITH DIFFERENTIAL/PLATELET
Basophils Absolute: 32 cells/uL (ref 0–200)
Basophils Relative: 0.5 %
EOS ABS: 141 {cells}/uL (ref 15–500)
Eosinophils Relative: 2.2 %
HCT: 40.2 % (ref 38.5–50.0)
Hemoglobin: 13.9 g/dL (ref 13.2–17.1)
Lymphs Abs: 1613 cells/uL (ref 850–3900)
MCH: 32.3 pg (ref 27.0–33.0)
MCHC: 34.6 g/dL (ref 32.0–36.0)
MCV: 93.3 fL (ref 80.0–100.0)
MPV: 10.2 fL (ref 7.5–12.5)
Monocytes Relative: 8.6 %
NEUTROS PCT: 63.5 %
Neutro Abs: 4064 cells/uL (ref 1500–7800)
Platelets: 266 10*3/uL (ref 140–400)
RBC: 4.31 10*6/uL (ref 4.20–5.80)
RDW: 12.5 % (ref 11.0–15.0)
TOTAL LYMPHOCYTE: 25.2 %
WBC: 6.4 10*3/uL (ref 3.8–10.8)
WBCMIX: 550 {cells}/uL (ref 200–950)

## 2017-04-03 LAB — URIC ACID: Uric Acid, Serum: 6.2 mg/dL (ref 4.0–8.0)

## 2017-04-07 ENCOUNTER — Other Ambulatory Visit: Payer: Self-pay | Admitting: Family Medicine

## 2017-04-07 ENCOUNTER — Other Ambulatory Visit: Payer: Self-pay | Admitting: Rheumatology

## 2017-04-07 ENCOUNTER — Ambulatory Visit: Payer: No Typology Code available for payment source | Admitting: Family Medicine

## 2017-04-08 ENCOUNTER — Encounter: Payer: Self-pay | Admitting: Rheumatology

## 2017-04-08 ENCOUNTER — Ambulatory Visit (INDEPENDENT_AMBULATORY_CARE_PROVIDER_SITE_OTHER): Payer: No Typology Code available for payment source | Admitting: Rheumatology

## 2017-04-08 VITALS — BP 148/70 | HR 67 | Resp 17 | Ht 74.5 in | Wt 260.0 lb

## 2017-04-08 DIAGNOSIS — M1A09X0 Idiopathic chronic gout, multiple sites, without tophus (tophi): Secondary | ICD-10-CM | POA: Diagnosis not present

## 2017-04-08 DIAGNOSIS — M7711 Lateral epicondylitis, right elbow: Secondary | ICD-10-CM | POA: Diagnosis not present

## 2017-04-08 DIAGNOSIS — Z8639 Personal history of other endocrine, nutritional and metabolic disease: Secondary | ICD-10-CM

## 2017-04-08 DIAGNOSIS — Z87891 Personal history of nicotine dependence: Secondary | ICD-10-CM

## 2017-04-08 DIAGNOSIS — M353 Polymyalgia rheumatica: Secondary | ICD-10-CM | POA: Diagnosis not present

## 2017-04-08 DIAGNOSIS — M19042 Primary osteoarthritis, left hand: Secondary | ICD-10-CM

## 2017-04-08 DIAGNOSIS — M19072 Primary osteoarthritis, left ankle and foot: Secondary | ICD-10-CM

## 2017-04-08 DIAGNOSIS — Z79899 Other long term (current) drug therapy: Secondary | ICD-10-CM

## 2017-04-08 DIAGNOSIS — Z8679 Personal history of other diseases of the circulatory system: Secondary | ICD-10-CM

## 2017-04-08 DIAGNOSIS — M160 Bilateral primary osteoarthritis of hip: Secondary | ICD-10-CM

## 2017-04-08 DIAGNOSIS — M19041 Primary osteoarthritis, right hand: Secondary | ICD-10-CM

## 2017-04-08 DIAGNOSIS — M19071 Primary osteoarthritis, right ankle and foot: Secondary | ICD-10-CM

## 2017-04-08 NOTE — Patient Instructions (Addendum)
Labs in 6 months( October) Hand Exercises Hand exercises can be helpful to almost anyone. These exercises can strengthen the hands, improve flexibility and movement, and increase blood flow to the hands. These results can make work and daily tasks easier. Hand exercises can be especially helpful for people who have joint pain from arthritis or have nerve damage from overuse (carpal tunnel syndrome). These exercises can also help people who have injured a hand. Most of these hand exercises are fairly gentle stretching routines. You can do them often throughout the day. Still, it is a good idea to ask your health care provider which exercises would be best for you. Warming your hands before exercise may help to reduce stiffness. You can do this with gentle massage or by placing your hands in warm water for 15 minutes. Also, make sure you pay attention to your level of hand pain as you begin an exercise routine. Exercises Knuckle Bend Repeat this exercise 5-10 times with each hand. 1. Stand or sit with your arm, hand, and all five fingers pointed straight up. Make sure your wrist is straight. 2. Gently and slowly bend your fingers down and inward until the tips of your fingers are touching the tops of your palm. 3. Hold this position for a few seconds. 4. Extend your fingers out to their original position, all pointing straight up again.  Finger Fan Repeat this exercise 5-10 times with each hand. 1. Hold your arm and hand out in front of you. Keep your wrist straight. 2. Squeeze your hand into a fist. 3. Hold this position for a few seconds. 4. Edison Simon out, or spread apart, your hand and fingers as much as possible, stretching every joint fully.  Tabletop Repeat this exercise 5-10 times with each hand. 1. Stand or sit with your arm, hand, and all five fingers pointed straight up. Make sure your wrist is straight. 2. Gently and slowly bend your fingers at the knuckles where they meet the hand until  your hand is making an upside-down L shape. Your fingers should form a tabletop. 3. Hold this position for a few seconds. 4. Extend your fingers out to their original position, all pointing straight up again.  Making Os Repeat this exercise 5-10 times with each hand. 1. Stand or sit with your arm, hand, and all five fingers pointed straight up. Make sure your wrist is straight. 2. Make an O shape by touching your pointer finger to your thumb. Hold for a few seconds. Then open your hand wide. 3. Repeat this motion with each finger on your hand.  Table Spread Repeat this exercise 5-10 times with each hand. 1. Place your hand on a table with your palm facing down. Make sure your wrist is straight. 2. Spread your fingers out as much as possible. Hold this position for a few seconds. 3. Slide your fingers back together again. Hold for a few seconds.  Ball Grip  Repeat this exercise 10-15 times with each hand. 1. Hold a tennis ball or another soft ball in your hand. 2. While slowly increasing pressure, squeeze the ball as hard as possible. 3. Squeeze as hard as you can for 3-5 seconds. 4. Relax and repeat.  Wrist Curls Repeat this exercise 10-15 times with each hand. 1. Sit in a chair that has armrests. 2. Hold a light weight in your hand, such as a dumbbell that weighs 1-3 pounds (0.5-1.4 kg). Ask your health care provider what weight would be best for you. 3.  Rest your hand just over the end of the chair arm with your palm facing up. 4. Gently pivot your wrist up and down while holding the weight. Do not twist your wrist from side to side.  Contact a health care provider if:  Your hand pain or discomfort gets much worse when you do an exercise.  Your hand pain or discomfort does not improve within 2 hours after you exercise. If you have any of these problems, stop doing these exercises right away. Do not do them again unless your health care provider says that you can. Get help right  away if:  You develop sudden, severe hand pain. If this happens, stop doing these exercises right away. Do not do them again unless your health care provider says that you can. This information is not intended to replace advice given to you by your health care provider. Make sure you discuss any questions you have with your health care provider. Document Released: 12/03/2014 Document Revised: 05/30/2015 Document Reviewed: 07/02/2014 Elsevier Interactive Patient Education  2018 [place].  Elbow and Forearm Exercises Ask your health care provider which exercises are safe for you. Do exercises exactly as told by your health care provider and adjust them as directed. It is normal to feel mild stretching, pulling, tightness, or discomfort as you do these exercises, but you should stop right away if you feel sudden pain or your pain gets worse.Do not begin these exercises until told by your health care provider. RANGE OF MOTION EXERCISES These exercises warm up your muscles and joints and improve the movement and flexibility of your injured elbow and forearm. These exercises also help to relieve pain, numbness, and tingling.These exercises are done using the muscles in your injured elbow and forearm. Exercise A: Elbow Flexion, Active 1. Hold your left / right arm at your side, and bend your elbow as far as you can using your left / right arm muscles. 2. Hold this position for __________ seconds. 3. Slowly return to the starting position. Repeat __________ times. Complete this exercise __________ times a day. Exercise B: Elbow Extension, Active 1. Hold your left / right arm at your side, and straighten your elbow as much as you can using your left / right arm muscles. 2. Hold this position for __________ seconds. 3. Slowly return to the starting position. Repeat __________ times. Complete this exercise __________ times a day. Exercise C: Forearm Rotation, Supination, Active 1. Stand or sit with  your elbows at your sides. 2. Bend your left / right elbow to an "L" shape (90 degrees). 3. Turn your palm upward until you feel a gentle stretch on the inside of your forearm. 4. Hold this position for __________ seconds. 5. Slowly release and return to the starting position. Repeat __________ times. Complete this exercise __________ times a day. Exercise D: Forearm Rotation, Pronation, Active 1. Stand or sit with your elbows at your side. 2. Bend your left / right elbow to an "L" shape (90 degrees). 3. Turn your left / right palm downward until you feel a gentle stretch on the top of your forearm. 4. Hold this position for __________ seconds. 5. Slowly release and return to the starting position. Repeat__________ times. Complete this exercise __________ times a day. STRETCHING EXERCISES These exercises warm up your muscles and joints and improve the movement and flexibility of your injured elbow and forearm. These exercises also help to relieve pain, numbness, and tingling.These exercises are done using your healthy elbow and forearm  to help stretch the muscles in your injured elbow and forearm. Exercise E: Elbow Flexion, Active-Assisted  1. Hold your left / right arm at your side, and bend your elbow as much as you can using your left / right arm muscles. 2. Use your other hand to bend your left / right elbow farther. To do this, gently push up on your forearm until you feel a gentle stretch on the back of your elbow. 3. Hold this position for __________ seconds. 4. Slowly return to the starting position. Repeat __________ times. Complete this exercise __________ times a day. Exercise F: Elbow Extension, Active-Assisted  1. Hold your left / right arm at your side, and straighten your elbow as much as you can using your left / right arm muscles. 2. Use your other hand to straighten the left / right elbow farther. To do this, gently push down on your forearm until you feel a gentle  stretch on the inside of your elbow. 3. Hold this position for __________ seconds. 4. Slowly return to the starting position. Repeat __________ times. Complete this exercise __________ times a day. Exercise G: Forearm Rotation, Supination, Active-Assisted  1. Sit with your left / right elbow bent in an "L" shape (90 degrees) with your forearm resting on a table. 2. Keeping your upper body and shoulder still, rotate your forearm so your left / right palm faces upward. 3. Use your other hand to help rotate your forearm further until you feel a gentle to moderate stretch. 4. Hold this position for __________ seconds. 5. Slowly release the stretch and return to the starting position. Repeat __________ times. Complete this exercise __________ times a day. Exercise H: Forearm Rotation, Pronation, Active-Assisted  1. Sit with your left / right elbow bent in an "L" shape (90 degrees) with your forearm resting on a table. 2. Keeping your upper body and shoulder still, rotate your forearm so your palm faces the tabletop. 3. Use your other hand to help rotate your forearm further until you feel a gentle to moderate stretch. 4. Hold this position for __________ seconds. 5. Slowly release the stretch and return to the starting position. Repeat __________ times. Complete this exercise __________ times a day. Exercise I: Elbow Flexion, Supine, Passive 1. Lie on your back. 2. Extend your left / right arm up in the air, bracing it with your other hand. 3. Let your left / right your hand slowly lower toward your shoulder, while your elbow stays pointed toward the ceiling. You should feel a gentle stretch along the back of your upper arm and elbow. 4. If instructed by your health care provider, you may increase the intensity of your stretch by adding a small wrist weight or hand weight. 5. Hold this position for __________ seconds. 6. Slowly return to the starting position. Repeat __________ times. Complete  this exercise __________ times a day. Exercise J: Elbow Extension, Supine, Passive  1. Lie on your back. Make sure that you are in a comfortable position that lets you relax your arm muscles. 2. Place a folded towel under your left / right upper arm so your elbow and shoulder are at the same height. Straighten your left / right arm so your elbow does not rest on the bed or towel. 3. Let the weight of your hand stretch your elbow. Keep your arm and chest muscles relaxed. You should feel a stretch on the inside of your elbow. 4. If told by your health care provider, you may increase  the intensity of your stretch by adding a small wrist weight or hand weight. 5. Hold this position for__________ seconds. 6. Slowly release the stretch. Repeat __________ times. Complete this exercise __________ times a day. STRENGTHENING EXERCISES These exercises build strength and endurance in your elbow and forearm. Endurance is the ability to use your muscles for a long time, even after they get tired. Exercise K: Elbow Flexion, Isometric  1. Stand or sit up straight. 2. Bend your left / right elbow in an "L" shape (90 degrees) and turn your palm up so your forearm is at the height of your waist. 3. Place your other hand on top of your forearm. Gently push down as your left / right arm resists. Push as hard as you can with both arms without causing any pain or movement at your left / right elbow. 4. Hold this position for __________ seconds. 5. Slowly release the tension in both arms. Let your muscles relax completely before repeating. Repeat __________ times. Complete this exercise __________ times a day. Exercise L: Elbow Extensors, Isometric  1. Stand or sit up straight. 2. Place your left / right arm so your palm faces your abdomen and it is at the height of your waist. 3. Place your other hand on the underside of your forearm. Gently push up as your left / right arm resists. Push as hard as you can with  both arms, without causing any pain or movement at your left / right elbow. 4. Hold this position for __________ seconds. 5. Slowly release the tension in both arms. Let your muscles relax completely before repeating. Repeat __________ times. Complete this exercise __________ times a day. Exercise M: Elbow Flexion With Forearm Palm Up  1. Sit upright on a firm chair without armrests, or stand. 2. Place your left / right arm at your side with your palm facing forward. 3. Holding a __________weight or gripping a rubber exercise band or tubing, bend your elbow to bring your hand toward your shoulder. 4. Hold this position for __________ seconds. 5. Slowly return to the starting position. Repeat __________times. Complete this exercise __________times a day. Exercise N: Elbow Extension  1. Sit on a firm chair without armrests, or stand. 2. Keeping your upper arms at your sides, bring both hands up toward your left / right shoulder while you grip a rubber exercise band or tubing. Your left / right hand should be just below the other hand. 3. Straighten your left / right elbow. 4. Hold this position for __________ seconds. 5. Control the resistance of the band or tubing as your hand returns to your side. Repeat __________times. Complete this exercise __________times a day. Exercise O: Forearm Rotation, Supination  1. Sit with your left / right forearm supported on a table. Keep your elbow at waist height. 2. Rest your hand over the edge of the table with your palm facing down. 3. Gently hold a lightweight hammer. 4. Without moving your elbow, slowly rotate your forearm to turn your palm and hand upward to a "thumbs-up" position. 5. Hold this position for __________ seconds. 6. Slowly return to the starting position. Repeat __________times. Complete this exercise __________times a day. Exercise P: Forearm Rotation, Pronation  1. Sit with your left / right forearm supported on a table. Keep  your elbow below shoulder height. 2. Rest your hand over the edge of the table with your palm facing up. 3. Gently hold a lightweight hammer. 4. Without moving your elbow, slowly rotate your forearm  to turn your palm and hand upward to a "thumbs-up" position. 5. Hold this position for __________seconds. 6. Slowly return to the starting position. Repeat __________times. Complete this exercise __________times a day. This information is not intended to replace advice given to you by your health care provider. Make sure you discuss any questions you have with your health care provider. Document Released: 11/05/2004 Document Revised: 05/02/2015 Document Reviewed: 09/16/2014 Elsevier Interactive Patient Education  2018 [place].  CBC, CMP and uric acid

## 2017-04-09 ENCOUNTER — Encounter: Payer: Self-pay | Admitting: Internal Medicine

## 2017-04-09 ENCOUNTER — Telehealth: Payer: Self-pay | Admitting: Internal Medicine

## 2017-04-09 NOTE — Telephone Encounter (Signed)
3 attempts to schedule fu appt from recall list.  Mailed Letter. Deleting recall.  ° °

## 2017-04-28 ENCOUNTER — Encounter: Payer: Self-pay | Admitting: Primary Care

## 2017-04-28 ENCOUNTER — Ambulatory Visit (INDEPENDENT_AMBULATORY_CARE_PROVIDER_SITE_OTHER): Payer: No Typology Code available for payment source | Admitting: Primary Care

## 2017-04-28 VITALS — BP 138/68 | HR 66 | Temp 98.2°F | Ht 74.5 in | Wt 254.5 lb

## 2017-04-28 DIAGNOSIS — H6123 Impacted cerumen, bilateral: Secondary | ICD-10-CM | POA: Diagnosis not present

## 2017-04-28 DIAGNOSIS — H60503 Unspecified acute noninfective otitis externa, bilateral: Secondary | ICD-10-CM | POA: Diagnosis not present

## 2017-04-28 MED ORDER — CIPROFLOXACIN HCL 0.2 % OT SOLN: 0.2000 mL | Freq: Two times a day (BID) | OTIC | 0 refills | Status: DC

## 2017-04-28 NOTE — Progress Notes (Signed)
Subjective:    Patient ID: Robert Shea, male    DOB: 1955/03/01, 62 y.o.   MRN: [id number]  HPI  Robert Shea is a 62 year old male with a history of hypertension, carotid artery stenosis, hypothyroidism, OSA who presents today with a chief complaint of tinnitus.  He also reports ear fullness and itching. His symptoms of itching and fullness began prior to the [place] in 2018, ringing began 1-2 months ago. His symptoms are located to the bilateral ears. He has noticed rhinorrhea. He denies fevers, cough, ear pain, sore throat, dizziness. He has noticed increased wax production recently and has been using q-tips.  The ringing is getting louder and is mostly constant, doesn't notice it as loudly at times. He endorses listening to loud music during childhood and teenage years. He denies repetitive exposure to loud machinery or gunfire without protection.   Review of Systems  HENT: Positive for rhinorrhea and tinnitus. Negative for congestion, ear pain and sinus pain.   Respiratory: Negative for cough.   Neurological: Negative for dizziness and headaches.       Past Medical History:  Diagnosis Date  . Ex-smoker 2009   quit  . Gout   . Heart murmur longstanding  . History of colon polyps 2010   benign  . HLD (hyperlipidemia)   . HTN (hypertension) 2010  . Hypothyroidism 2010  . Obesity   . PMR (polymyalgia rheumatica) (HCC) 01/30/2014     Social History   Socioeconomic History  . Marital status: Married    Spouse name: Not on file  . Number of children: Not on file  . Years of education: Not on file  . Highest education level: Not on file  Occupational History  . Not on file  Social Needs  . Financial resource strain: Not on file  . Food insecurity:    Worry: Not on file    Inability: Not on file  . Transportation needs:    Medical: Not on file    Non-medical: Not on file  Tobacco Use  . Smoking status: Former Smoker    Packs/day: 1.00    Years: 25.00    Pack  years: 25.00    Last attempt to quit: 01/06/2007    Years since quitting: 10.3  . Smokeless tobacco: Never Used  Substance and Sexual Activity  . Alcohol use: Yes  . Drug use: No  . Sexual activity: Not on file  Lifestyle  . Physical activity:    Days per week: Not on file    Minutes per session: Not on file  . Stress: Not on file  Relationships  . Social connections:    Talks on phone: Not on file    Gets together: Not on file    Attends religious service: Not on file    Active member of club or organization: Not on file    Attends meetings of clubs or organizations: Not on file    Relationship status: Not on file  . Intimate partner violence:    Fear of current or ex partner: Not on file    Emotionally abused: Not on file    Physically abused: Not on file    Forced sexual activity: Not on file  Other Topics Concern  . Not on file  Social History Narrative   Lives with wife   [place] part time local   Occ: VP of sales   Edu: BS   Activity: gym 3x/wk  Diet: healthier recently - good water, fruits/vegetables daily    Past Surgical History:  Procedure Laterality Date  . COLONOSCOPY  07/2009   2 adenomas rpt 5 yrs Collie Siad)  . COLONOSCOPY  05/2013   1 hyperplastic polyp, rpt 10 yrs Ardis Hughs)  . VASECTOMY  1995    Family History  Problem Relation Age of Onset  . Cancer Father 40       bone?  . Cancer Maternal Uncle        throat, smoker  . Diabetes Mother   . Diabetes Brother   . CAD Paternal Grandfather 65       MI  . Stroke Paternal Grandfather   . CAD Brother 33       widowmaker  . Diabetes Brother   . Healthy Son   . Colon cancer Neg Hx   . Pancreatic cancer Neg Hx   . Stomach cancer Neg Hx   . Rectal cancer Neg Hx     Allergies  Allergen Reactions  . Uloric [Febuxostat]     Current Outpatient Medications on File Prior to Visit  Medication Sig Dispense Refill  . allopurinol (ZYLOPRIM) 300 MG tablet TAKE 1 TABLET BY MOUTH DAILY  90 tablet 1  . amLODipine (NORVASC) 10 MG tablet TAKE 1 TABLET BY MOUTH EVERY DAY 90 tablet 1  . folic acid (FOLVITE) 1 MG tablet Take 2 tablets (2 mg total) by mouth daily. 180 tablet 3  . levothyroxine (SYNTHROID, LEVOTHROID) 100 MCG tablet TAKE ONE TAB DAILY BEFORE BREAKFAST 30 tablet 4  . MITIGARE 0.6 MG CAPS TAKE 2 TABLETS BY MOUTH DAILY AS NEEDED 60 capsule 0  . VIAGRA 100 MG tablet TAKE 1/2 TO 1 TABLET BY MOUTH DAILY AS NEEDED FOR ERECTILE DYSFUNCTION 10 tablet 11  . Cholecalciferol (VITAMIN D3) 1000 units CAPS Take 1 capsule (1,000 Units total) by mouth daily. (Patient not taking: Reported on 10/28/2016) 30 capsule   . cyclobenzaprine (FLEXERIL) 10 MG tablet TAKE 1/2 TO 1 TABLET BY MOUTH 2 TIMES DAILY AS NEEDED FOR MUSCLE SPASMS (Patient not taking: Reported on 10/28/2016) 30 tablet 3  . methotrexate (RHEUMATREX) 2.5 MG tablet TAKE 4 TABLETS (10 MG TOTAL) BY MOUTH ONCE A WEEK. (Patient not taking: Reported on 04/28/2017) 16 tablet 0  . ondansetron (ZOFRAN) 4 MG tablet Take 1 tablet 1 hour prior to Methotrexate and then every 8 hours thereafter as needed fro nausea and vomiting. (Patient not taking: Reported on 04/28/2017) 30 tablet 2  . phentermine 30 MG capsule TAKE 1 CAPSULE BY MOUTH EVERY DAY IN THE MORNING (Patient not taking: Reported on 04/28/2017) 30 capsule 0  . simvastatin (ZOCOR) 20 MG tablet TAKE 1 TABLET BY MOUTH EVERY DAY (Patient not taking: Reported on 04/28/2017) 90 tablet 0   No current facility-administered medications on file prior to visit.     BP 138/68   Pulse 66   Temp 98.2 F (36.8 C) (Oral)   Ht 6' 2.5" (1.892 m)   Wt 254 lb 8 oz (115.4 kg)   SpO2 98%   BMI 32.24 kg/m    Objective:   Physical Exam  Constitutional: He appears well-nourished.  HENT:  Nose: No mucosal edema. Right sinus exhibits no maxillary sinus tenderness and no frontal sinus tenderness. Left sinus exhibits no maxillary sinus tenderness and no frontal sinus tenderness.  Mouth/Throat:  Oropharynx is clear and moist.  Mild amount of dried cerumen to bilateral TM's. Erythema to canals. Tm's post debrox drops and irrigation without visualization of TM's  given amount of impaction.    Eyes: Conjunctivae are normal.  Neck: Neck supple.  Cardiovascular: Normal rate and regular rhythm.  Pulmonary/Chest: Effort normal and breath sounds normal. He has no wheezes. He has no rales.  Skin: Skin is warm and dry.          Assessment & Plan:  Acute Otitis Externa:  Itching and fullness x 6 months, ringing x 2 months. Exam today difficult given impaction, irritation to canals.  Rx for Cipro drops sent to pharmacy.  He will follow up if no improvement. Patient evaluated and examined with Dr. Danise Mina who agrees with the plan.  Pleas Koch, NP

## 2017-04-28 NOTE — Patient Instructions (Signed)
Instill one vial into both ears twice daily for 7 days.  Please schedule a follow up visit with Dr. Danise Mina if no improvement in 1 week.  It was a pleasure meeting you!

## 2017-04-30 ENCOUNTER — Other Ambulatory Visit: Payer: Self-pay | Admitting: Primary Care

## 2017-04-30 DIAGNOSIS — H60503 Unspecified acute noninfective otitis externa, bilateral: Secondary | ICD-10-CM

## 2017-05-02 ENCOUNTER — Other Ambulatory Visit: Payer: Self-pay | Admitting: Family Medicine

## 2017-05-03 ENCOUNTER — Encounter: Payer: Self-pay | Admitting: Family Medicine

## 2017-05-03 DIAGNOSIS — H60503 Unspecified acute noninfective otitis externa, bilateral: Secondary | ICD-10-CM

## 2017-05-03 MED ORDER — CIPROFLOXACIN HCL 0.2 % OT SOLN
0.2000 mL | Freq: Two times a day (BID) | OTIC | 0 refills | Status: AC
Start: 2017-05-03 — End: 2017-05-10

## 2017-05-03 MED ORDER — CIPROFLOXACIN HCL 0.2 % OT SOLN: 0.2000 mL | Freq: Two times a day (BID) | OTIC | 0 refills | Status: DC

## 2017-05-03 NOTE — Telephone Encounter (Signed)
Refill sent to pharmacy.   

## 2017-05-03 NOTE — Telephone Encounter (Signed)
Ok to refill?  MyChart refill request for Ciprofloxacin HCl 0.2 % otic solution  Last prescribed on 04/28/2017

## 2017-05-03 NOTE — Telephone Encounter (Signed)
Last filled:  03/25/17, #30 Last OV:  04/28/17 Next OV:  none

## 2017-05-07 ENCOUNTER — Other Ambulatory Visit: Payer: Self-pay | Admitting: Rheumatology

## 2017-05-07 NOTE — Telephone Encounter (Signed)
Last visit: 04/08/17 Next Visit due October 2019. Message sent to the front to schedule patient.  Labs: 04/02/17 Uric acid 6.2. All other labs are WNL.  Okay to refill per Dr. Estanislado Pandy

## 2017-05-10 ENCOUNTER — Telehealth: Payer: Self-pay | Admitting: Rheumatology

## 2017-05-10 NOTE — Telephone Encounter (Signed)
LMOM for patient to call and schedule follow-up appointment with Dr. Estanislado Pandy in October.

## 2017-05-10 NOTE — Telephone Encounter (Signed)
-----   Message from Carole Binning, LPN sent at 02/09/1751  8:49 AM EDT ----- Regarding: Please schedule patient a follow up visit Please schedule patient a follow up visit. Patient due October 2019. Thanks!

## 2017-06-10 ENCOUNTER — Other Ambulatory Visit: Payer: Self-pay | Admitting: Family Medicine

## 2017-06-10 NOTE — Telephone Encounter (Signed)
Has been off and on this medicine for the past 1+ year, restarted 12/2016.  Recommend we stop this medication.

## 2017-06-10 NOTE — Telephone Encounter (Signed)
Phentermine Last filled:  05/05/17, #30 Last OV:  04/28/17 Next OV:  none

## 2017-06-21 ENCOUNTER — Other Ambulatory Visit: Payer: Self-pay | Admitting: Rheumatology

## 2017-06-21 NOTE — Telephone Encounter (Signed)
Last visit: 04/08/17 Next Visit due October 2019. Message sent to the front to schedule patient.  Labs: 04/02/17 Uric acid 6.2. All other labs are WNL.  Okay to refill per Dr. Estanislado Pandy

## 2017-07-05 ENCOUNTER — Other Ambulatory Visit: Payer: Self-pay | Admitting: Rheumatology

## 2017-07-05 NOTE — Telephone Encounter (Addendum)
Last visit: 04/08/17 Next Visit due October 2019. Message sent to the front to schedule patient.   Okay to refill per Dr. Estanislado Pandy

## 2017-07-06 ENCOUNTER — Telehealth: Payer: Self-pay | Admitting: Rheumatology

## 2017-07-06 NOTE — Telephone Encounter (Signed)
LMOM for patient to call and schedule his follow-up appointment with Dr. Estanislado Pandy.

## 2017-07-06 NOTE — Telephone Encounter (Signed)
-----   Message from Carole Binning, LPN sent at 0/01/7791  9:31 AM EDT ----- Regarding: Please schedule patient a follow up visit Please schedule patient a follow up visit. Patient due November 2019. Thanks!

## 2017-07-07 ENCOUNTER — Other Ambulatory Visit: Payer: Self-pay | Admitting: Family Medicine

## 2017-07-23 ENCOUNTER — Other Ambulatory Visit: Payer: Self-pay | Admitting: Family Medicine

## 2017-08-08 ENCOUNTER — Other Ambulatory Visit: Payer: Self-pay | Admitting: Family Medicine

## 2017-08-12 ENCOUNTER — Ambulatory Visit (INDEPENDENT_AMBULATORY_CARE_PROVIDER_SITE_OTHER): Payer: No Typology Code available for payment source | Admitting: Family Medicine

## 2017-08-12 ENCOUNTER — Encounter: Payer: Self-pay | Admitting: Family Medicine

## 2017-08-12 VITALS — BP 138/68 | HR 77 | Temp 98.2°F | Ht 74.0 in | Wt 261.5 lb

## 2017-08-12 DIAGNOSIS — Z0000 Encounter for general adult medical examination without abnormal findings: Secondary | ICD-10-CM | POA: Diagnosis not present

## 2017-08-12 DIAGNOSIS — Z125 Encounter for screening for malignant neoplasm of prostate: Secondary | ICD-10-CM

## 2017-08-12 DIAGNOSIS — M150 Primary generalized (osteo)arthritis: Secondary | ICD-10-CM | POA: Diagnosis not present

## 2017-08-12 DIAGNOSIS — Z7189 Other specified counseling: Secondary | ICD-10-CM

## 2017-08-12 DIAGNOSIS — Z87891 Personal history of nicotine dependence: Secondary | ICD-10-CM | POA: Diagnosis not present

## 2017-08-12 DIAGNOSIS — M159 Polyosteoarthritis, unspecified: Secondary | ICD-10-CM

## 2017-08-12 DIAGNOSIS — M1A09X0 Idiopathic chronic gout, multiple sites, without tophus (tophi): Secondary | ICD-10-CM

## 2017-08-12 DIAGNOSIS — E669 Obesity, unspecified: Secondary | ICD-10-CM

## 2017-08-12 DIAGNOSIS — Z79899 Other long term (current) drug therapy: Secondary | ICD-10-CM

## 2017-08-12 DIAGNOSIS — R739 Hyperglycemia, unspecified: Secondary | ICD-10-CM

## 2017-08-12 DIAGNOSIS — E039 Hypothyroidism, unspecified: Secondary | ICD-10-CM

## 2017-08-12 DIAGNOSIS — G4733 Obstructive sleep apnea (adult) (pediatric): Secondary | ICD-10-CM

## 2017-08-12 DIAGNOSIS — Z9989 Dependence on other enabling machines and devices: Secondary | ICD-10-CM

## 2017-08-12 DIAGNOSIS — E782 Mixed hyperlipidemia: Secondary | ICD-10-CM

## 2017-08-12 DIAGNOSIS — E66811 Obesity, class 1: Secondary | ICD-10-CM

## 2017-08-12 DIAGNOSIS — I10 Essential (primary) hypertension: Secondary | ICD-10-CM

## 2017-08-12 DIAGNOSIS — Z7184 Encounter for health counseling related to travel: Secondary | ICD-10-CM

## 2017-08-12 MED ORDER — AMLODIPINE BESYLATE 10 MG PO TABS: 10.0000 mg | ORAL_TABLET | Freq: Every day | ORAL | 3 refills | Status: DC

## 2017-08-12 MED ORDER — CIPROFLOXACIN HCL 500 MG PO TABS
500.0000 mg | ORAL_TABLET | Freq: Two times a day (BID) | ORAL | 0 refills | Status: DC
Start: 2017-08-12 — End: 2017-12-21

## 2017-08-12 MED ORDER — ZOLPIDEM TARTRATE 10 MG PO TABS: 10.0000 mg | ORAL_TABLET | Freq: Every evening | ORAL | 0 refills | Status: DC | PRN

## 2017-08-12 MED ORDER — LEVOTHYROXINE SODIUM 100 MCG PO TABS: 100.0000 ug | ORAL_TABLET | Freq: Every day | ORAL | 3 refills | Status: DC

## 2017-08-12 NOTE — Assessment & Plan Note (Signed)
Chronic. Update TFTs.

## 2017-08-12 NOTE — Assessment & Plan Note (Signed)
Upcoming tip to [place] including [place]. Reviewed recommendations on CDC website.  He may need Hep A/B - may come here for vaccination. Unsure if it will be covered by insurance. Rx for ambien for travel #10 as well as cipro 3d course for traveler's diarrhea.

## 2017-08-12 NOTE — Assessment & Plan Note (Signed)
Discussed lung cancer screening - I think he is eligible. Will refer.

## 2017-08-12 NOTE — Progress Notes (Signed)
BP 138/68 (BP Location: Left Arm, Patient Position: Sitting, Cuff Size: Large)   Pulse 77   Temp 98.2 F (36.8 C) (Oral)   Ht 6\' 2"  (1.88 m)   Wt 261 lb 8 oz (118.6 kg)   SpO2 97%   BMI 33.57 kg/m    CC: CPE Subjective:    Patient ID: Robert Shea, male    DOB: 1955/01/16, 62 y.o.   MRN: [id number]  HPI: Robert Shea is a 62 y.o. male presenting on 08/12/2017 for Annual Exam (Pt going to [place] in next 2 wks. Requesting Ambien rx and abx rx. )   Lives in [place], has home locally as well.   Osteoarthritis, gout, PMR - sees rheum (Deveshwar) on allopurinol and PRN mitigare.  Upcoming trip to [place] for 3 wks - [place], [place], [place], [place] - requests Rx [place]. Has tolerated this well in the past. Asks about abx for trip.   Preventative: COLONOSCOPY Date: 05/2013 1 hyperplastic polyp, rpt 10 yrs Ardis Hughs) Prostate - discussed, would like screening for now with PSA/DRE Lung cancer screening - discussed, pt would be eligible.  Flu - yearly  Tdap - 04/2014  Pneumovax 2016 Shingrix - discussed  Seat belt use discussed  Sunscreen use discussed. No changing moles on skin. Sees derm yearly. Recent back skin biopsy late spring - suture never dissolved, still bothering him.  Ex smoker - quit 2009. Prior 1 ppd for 30 yrs.  Alcohol - has decreased drinking to occasional  Dentist Q6 mo Eye exam yearly  Lives with wife Grown child Part time [place] part time local Occ: [employer]: BS Activity: gym 3x/wk - biking and walking on treadmill, weight lifting as well - stays active regularly  Diet: healthy - good water, fruits/vegetables daily, avoids fried and fatty foods   Relevant past medical, surgical, family and social history reviewed and updated as indicated. Interim medical history since our last visit reviewed. Allergies and medications reviewed and updated. Outpatient Medications Prior to Visit  Medication Sig Dispense Refill  . allopurinol  (ZYLOPRIM) 300 MG tablet TAKE 1 TABLET BY MOUTH DAILY 90 tablet 1  . MITIGARE 0.6 MG CAPS TAKE 2 TABLETS BY MOUTH DAILY AS NEEDED 60 capsule 2  . sildenafil (VIAGRA) 100 MG tablet TAKE 1/2 TO 1 TABLET EVERY DAY AS NEEDED 10 tablet 1  . amLODipine (NORVASC) 10 MG tablet TAKE 1 TABLET BY MOUTH EVERY DAY 90 tablet 1  . levothyroxine (SYNTHROID, LEVOTHROID) 100 MCG tablet TAKE 1 TABLET BY MOUTH EVERY DAY BEFORE BREAKFAST 30 tablet 0  . Cholecalciferol (VITAMIN D3) 1000 units CAPS Take 1 capsule (1,000 Units total) by mouth daily. (Patient not taking: Reported on 10/28/2016) 30 capsule   . cyclobenzaprine (FLEXERIL) 10 MG tablet TAKE 1/2 TO 1 TABLET BY MOUTH 2 TIMES DAILY AS NEEDED FOR MUSCLE SPASMS (Patient not taking: Reported on 10/28/2016) 30 tablet 3  . folic acid (FOLVITE) 1 MG tablet TAKE 2 TABLETS (2 MG TOTAL) BY MOUTH DAILY. 180 tablet 3  . methotrexate (RHEUMATREX) 2.5 MG tablet TAKE 4 TABLETS (10 MG TOTAL) BY MOUTH ONCE A WEEK. (Patient not taking: Reported on 04/28/2017) 16 tablet 0  . ondansetron (ZOFRAN) 4 MG tablet Take 1 tablet 1 hour prior to Methotrexate and then every 8 hours thereafter as needed fro nausea and vomiting. (Patient not taking: Reported on 04/28/2017) 30 tablet 2  . phentermine 30 MG capsule TAKE 1 CAPSULE BY MOUTH EVERY DAY IN THE MORNING  30 capsule 0  . simvastatin (ZOCOR) 20 MG tablet TAKE 1 TABLET BY MOUTH EVERY DAY (Patient not taking: Reported on 04/28/2017) 90 tablet 0   No facility-administered medications prior to visit.      Per HPI unless specifically indicated in ROS section below Review of Systems  Constitutional: Negative for activity change, appetite change, chills, fatigue, fever and unexpected weight change.  HENT: Negative for hearing loss.   Eyes: Negative for visual disturbance.  Respiratory: Negative for cough, chest tightness, shortness of breath and wheezing.   Cardiovascular: Positive for leg swelling (occasional after on his feet all day).  Negative for chest pain and palpitations.  Gastrointestinal: Positive for constipation (not recently). Negative for abdominal distention, abdominal pain, blood in stool, diarrhea, nausea and vomiting.  Genitourinary: Negative for difficulty urinating and hematuria.  Musculoskeletal: Negative for arthralgias, myalgias and neck pain.  Skin: Negative for rash.  Neurological: Negative for dizziness, seizures, syncope and headaches.  Hematological: Negative for adenopathy. Does not bruise/bleed easily.  Psychiatric/Behavioral: Negative for dysphoric mood. The patient is not nervous/anxious.        Objective:    BP 138/68 (BP Location: Left Arm, Patient Position: Sitting, Cuff Size: Large)   Pulse 77   Temp 98.2 F (36.8 C) (Oral)   Ht 6\' 2"  (1.88 m)   Wt 261 lb 8 oz (118.6 kg)   SpO2 97%   BMI 33.57 kg/m   Wt Readings from Last 3 Encounters:  08/12/17 261 lb 8 oz (118.6 kg)  04/28/17 254 lb 8 oz (115.4 kg)  04/08/17 260 lb (117.9 kg)    Physical Exam  Constitutional: He is oriented to person, place, and time. He appears well-developed and well-nourished. No distress.  HENT:  Head: Normocephalic and atraumatic.  Right Ear: Hearing, tympanic membrane, external ear and ear canal normal.  Left Ear: Hearing, tympanic membrane, external ear and ear canal normal.  Nose: Nose normal.  Mouth/Throat: Uvula is midline, oropharynx is clear and moist and mucous membranes are normal. No oropharyngeal exudate, posterior oropharyngeal edema or posterior oropharyngeal erythema.  Eyes: Pupils are equal, round, and reactive to light. Conjunctivae and EOM are normal. No scleral icterus.  Neck: Normal range of motion. Neck supple. No thyromegaly present.  Cardiovascular: Normal rate, regular rhythm, normal heart sounds and intact distal pulses.  No murmur heard. Pulses:      Radial pulses are 2+ on the right side, and 2+ on the left side.  Pulmonary/Chest: Effort normal and breath sounds normal. No  respiratory distress. He has no wheezes. He has no rales.  Abdominal: Soft. Bowel sounds are normal. He exhibits no distension and no mass. There is no tenderness. There is no rebound and no guarding.  Musculoskeletal: Normal range of motion. He exhibits no edema.  Lymphadenopathy:    He has no cervical adenopathy.  Neurological: He is alert and oriented to person, place, and time.  CN grossly intact, station and gait intact  Skin: Skin is warm and dry. No rash noted.  Psychiatric: He has a normal mood and affect. His behavior is normal. Judgment and thought content normal.  Nursing note and vitals reviewed.  Results for orders placed or performed in visit on 04/02/17  Uric acid  Result Value Ref Range   Uric Acid, Serum 6.2 4.0 - 8.0 mg/dL  COMPLETE METABOLIC PANEL WITH GFR  Result Value Ref Range   Glucose, Bld 92 65 - 99 mg/dL   BUN 15 7 - 25 mg/dL   Creat  0.98 0.70 - 1.25 mg/dL   GFR, Est Non African American 83 > OR = 60 mL/min/1.81m2   GFR, Est African American 97 > OR = 60 mL/min/1.74m2   BUN/Creatinine Ratio NOT APPLICABLE 6 - 22 (calc)   Sodium 142 135 - 146 mmol/L   Potassium 4.3 3.5 - 5.3 mmol/L   Chloride 105 98 - 110 mmol/L   CO2 29 20 - 32 mmol/L   Calcium 10.0 8.6 - 10.3 mg/dL   Total Protein 7.3 6.1 - 8.1 g/dL   Albumin 4.7 3.6 - 5.1 g/dL   Globulin 2.6 1.9 - 3.7 g/dL (calc)   AG Ratio 1.8 1.0 - 2.5 (calc)   Total Bilirubin 0.7 0.2 - 1.2 mg/dL   Alkaline phosphatase (APISO) 82 40 - 115 U/L   AST 22 10 - 35 U/L   ALT 22 9 - 46 U/L  CBC with Differential/Platelet  Result Value Ref Range   WBC 6.4 3.8 - 10.8 Thousand/uL   RBC 4.31 4.20 - 5.80 Million/uL   Hemoglobin 13.9 13.2 - 17.1 g/dL   HCT 40.2 38.5 - 50.0 %   MCV 93.3 80.0 - 100.0 fL   MCH 32.3 27.0 - 33.0 pg   MCHC 34.6 32.0 - 36.0 g/dL   RDW 12.5 11.0 - 15.0 %   Platelets 266 140 - 400 Thousand/uL   MPV 10.2 7.5 - 12.5 fL   Neutro Abs 4,064 1,500 - 7,800 cells/uL   Lymphs Abs 1,613 850 - 3,900  cells/uL   WBC mixed population 550 200 - 950 cells/uL   Eosinophils Absolute 141 15 - 500 cells/uL   Basophils Absolute 32 0 - 200 cells/uL   Neutrophils Relative % 63.5 %   Total Lymphocyte 25.2 %   Monocytes Relative 8.6 %   Eosinophils Relative 2.2 %   Basophils Relative 0.5 %      Assessment & Plan:   Problem List Items Addressed This Visit    Travel advice encounter    Upcoming tip to [place] including [place]. Reviewed recommendations on CDC website.  He may need Hep A/B - may come here for vaccination. Unsure if it will be covered by insurance. Rx for ambien for travel #10 as well as cipro 3d course for traveler's diarrhea.       Osteoarthritis of multiple joints   OSA on CPAP    Compliant with CPAP, tolerating well.       Obesity, Class I, BMI 30-34.9    Encouraged healthy diet and lifestyle choices.  Now off phentermine.       Hypothyroidism    Chronic. Update TFTs.      Relevant Medications   levothyroxine (SYNTHROID, LEVOTHROID) 100 MCG tablet   Other Relevant Orders   TSH   Hyperglycemia    Update labs when he returns next week for fasting labs.       Relevant Orders   Hemoglobin A1c   HTN (hypertension)    Chronic, stable. Continue current regimen.       Relevant Medications   amLODipine (NORVASC) 10 MG tablet   HLD (hyperlipidemia)    Chronic, has stopped simvastatin. Will check FLP when he returns fasting next week  The 10-year ASCVD risk score [name] DC Jr., et al., 2013) is: 7.8%   Values used to calculate the score:     Age: 53 years     Sex: Male     Is Non-Hispanic African American: No     Diabetic: No  Tobacco smoker: No     Systolic Blood Pressure: 017 mmHg     Is BP treated: Yes     HDL Cholesterol: 65 mg/dL     Total Cholesterol: 144 mg/dL       Relevant Medications   amLODipine (NORVASC) 10 MG tablet   Other Relevant Orders   Lipid panel   Comprehensive metabolic panel   High risk medication use    Now off MTX       Health care maintenance - Primary    Preventative protocols reviewed and updated unless pt declined. Discussed healthy diet and lifestyle.       Gout    Urate levels normal 03/2017 - followed by rheum.      Ex-smoker    Discussed lung cancer screening - I think he is eligible. Will refer.       Relevant Orders   Ambulatory Referral for Lung Cancer Scre    Other Visit Diagnoses    Special screening for malignant neoplasm of prostate       Relevant Orders   PSA       Meds ordered this encounter  Medications  . levothyroxine (SYNTHROID, LEVOTHROID) 100 MCG tablet    Sig: Take 1 tablet (100 mcg total) by mouth daily before breakfast.    Dispense:  90 tablet    Refill:  3  . amLODipine (NORVASC) 10 MG tablet    Sig: Take 1 tablet (10 mg total) by mouth daily.    Dispense:  90 tablet    Refill:  3  . ciprofloxacin (CIPRO) 500 MG tablet    Sig: Take 1 tablet (500 mg total) by mouth 2 (two) times daily.    Dispense:  6 tablet    Refill:  0  . zolpidem (AMBIEN) 10 MG tablet    Sig: Take 1 tablet (10 mg total) by mouth at bedtime as needed for sleep.    Dispense:  10 tablet    Refill:  0   Orders Placed This Encounter  Procedures  . Lipid panel    Standing Status:   Future    Standing Expiration Date:   08/13/2018  . Comprehensive metabolic panel    Standing Status:   Future    Standing Expiration Date:   08/13/2018  . TSH    Standing Status:   Future    Standing Expiration Date:   08/13/2018  . Hemoglobin A1c    Standing Status:   Future    Standing Expiration Date:   08/13/2018  . PSA    Standing Status:   Future    Standing Expiration Date:   08/13/2018  . Ambulatory Referral for Lung Cancer Scre    Referral Priority:   Routine    Referral Type:   Consultation    Referral Reason:   Specialty Services Required    Number of Visits Requested:   1    Follow up plan: Return in about 1 year (around 08/13/2018) for annual exam, prior fasting for blood work.  Ria Bush, MD

## 2017-08-12 NOTE — Patient Instructions (Addendum)
Return next week fasting for blood work.  If interested, check with pharmacy about new 2 shot shingles series (shingrix).  We will refer you for lung cancer screening CT.  Cipro antibiotic to use if severe diarrhea  Ambien Rx provided for travel.  Good to see you today Return as needed or in 1 yea for next physical.  Health Maintenance, Male A healthy lifestyle and preventive care is important for your health and wellness. Ask your health care provider about what schedule of regular examinations is right for you. What should I know about weight and diet? Eat a Healthy Diet  Eat plenty of vegetables, fruits, whole grains, low-fat dairy products, and lean protein.  Do not eat a lot of foods high in solid fats, added sugars, or salt.  Maintain a Healthy Weight Regular exercise can help you achieve or maintain a healthy weight. You should:  Do at least 150 minutes of exercise each week. The exercise should increase your heart rate and make you sweat (moderate-intensity exercise).  Do strength-training exercises at least twice a week.  Watch Your Levels of Cholesterol and Blood Lipids  Have your blood tested for lipids and cholesterol every 5 years starting at 61 years of age. If you are at high risk for heart disease, you should start having your blood tested when you are 61 years old. You may need to have your cholesterol levels checked more often if: ? Your lipid or cholesterol levels are high. ? You are older than 61 years of age. ? You are at high risk for heart disease.  What should I know about cancer screening? Many types of cancers can be detected early and may often be prevented. Lung Cancer  You should be screened every year for lung cancer if: ? You are a current smoker who has smoked for at least 30 years. ? You are a former smoker who has quit within the past 15 years.  Talk to your health care provider about your screening options, when you should start screening, and  how often you should be screened.  Colorectal Cancer  Routine colorectal cancer screening usually begins at 61 years of age and should be repeated every 5-10 years until you are 61 years old. You may need to be screened more often if early forms of precancerous polyps or small growths are found. Your health care provider may recommend screening at an earlier age if you have risk factors for colon cancer.  Your health care provider may recommend using home test kits to check for hidden blood in the stool.  A small camera at the end of a tube can be used to examine your colon (sigmoidoscopy or colonoscopy). This checks for the earliest forms of colorectal cancer.  Prostate and Testicular Cancer  Depending on your age and overall health, your health care provider may do certain tests to screen for prostate and testicular cancer.  Talk to your health care provider about any symptoms or concerns you have about testicular or prostate cancer.  Skin Cancer  Check your skin from head to toe regularly.  Tell your health care provider about any new moles or changes in moles, especially if: ? There is a change in a mole's size, shape, or color. ? You have a mole that is larger than a pencil eraser.  Always use sunscreen. Apply sunscreen liberally and repeat throughout the day.  Protect yourself by wearing long sleeves, pants, a wide-brimmed hat, and sunglasses when outside.  What should  I know about heart disease, diabetes, and high blood pressure?  If you are 4-46 years of age, have your blood pressure checked every 3-5 years. If you are 31 years of age or older, have your blood pressure checked every year. You should have your blood pressure measured twice-once when you are at a hospital or clinic, and once when you are not at a hospital or clinic. Record the average of the two measurements. To check your blood pressure when you are not at a hospital or clinic, you can use: ? An automated blood  pressure machine at a pharmacy. ? A home blood pressure monitor.  Talk to your health care provider about your target blood pressure.  If you are between 48-79 years old, ask your health care provider if you should take aspirin to prevent heart disease.  Have regular diabetes screenings by checking your fasting blood sugar level. ? If you are at a normal weight and have a low risk for diabetes, have this test once every three years after the age of 7. ? If you are overweight and have a high risk for diabetes, consider being tested at a younger age or more often.  A one-time screening for abdominal aortic aneurysm (AAA) by ultrasound is recommended for men aged 78-75 years who are current or former smokers. What should I know about preventing infection? Hepatitis B If you have a higher risk for hepatitis B, you should be screened for this virus. Talk with your health care provider to find out if you are at risk for hepatitis B infection. Hepatitis C Blood testing is recommended for:  Everyone born from [birth year] through 1964.  Anyone with known risk factors for hepatitis C.  Sexually Transmitted Diseases (STDs)  You should be screened each year for STDs including gonorrhea and chlamydia if: ? You are sexually active and are younger than 61 years of age. ? You are older than 62 years of age and your health care provider tells you that you are at risk for this type of infection. ? Your sexual activity has changed since you were last screened and you are at an increased risk for chlamydia or gonorrhea. Ask your health care provider if you are at risk.  Talk with your health care provider about whether you are at high risk of being infected with HIV. Your health care provider may recommend a prescription medicine to help prevent HIV infection.  What else can I do?  Schedule regular health, dental, and eye exams.  Stay current with your vaccines (immunizations).  Do not use any tobacco  products, such as cigarettes, chewing tobacco, and e-cigarettes. If you need help quitting, ask your health care provider.  Limit alcohol intake to no more than 2 drinks per day. One drink equals 12 ounces of beer, 5 ounces of wine, or 1 ounces of hard liquor.  Do not use street drugs.  Do not share needles.  Ask your health care provider for help if you need support or information about quitting drugs.  Tell your health care provider if you often feel depressed.  Tell your health care provider if you have ever been abused or do not feel safe at home. This information is not intended to replace advice given to you by your health care provider. Make sure you discuss any questions you have with your health care provider. Document Released: 06/20/2007 Document Revised: 08/21/2015 Document Reviewed: 09/25/2014 Elsevier Interactive Patient Education  [employer].

## 2017-08-12 NOTE — Assessment & Plan Note (Signed)
Now off MTX

## 2017-08-12 NOTE — Assessment & Plan Note (Signed)
Chronic, has stopped simvastatin. Will check FLP when he returns fasting next week  The 10-year ASCVD risk score [name] DC Brooke Bonito., et al., 2013) is: 7.8%   Values used to calculate the score:     Age: 62 years     Sex: Male     Is Non-Hispanic African American: No     Diabetic: No     Tobacco smoker: No     Systolic Blood Pressure: 321 mmHg     Is BP treated: Yes     HDL Cholesterol: 65 mg/dL     Total Cholesterol: 144 mg/dL

## 2017-08-12 NOTE — Assessment & Plan Note (Signed)
Urate levels normal 03/2017 - followed by rheum.

## 2017-08-12 NOTE — Assessment & Plan Note (Addendum)
Encouraged healthy diet and lifestyle choices.  Now off phentermine.

## 2017-08-12 NOTE — Assessment & Plan Note (Signed)
Update labs when he returns next week for fasting labs.

## 2017-08-12 NOTE — Assessment & Plan Note (Signed)
Preventative protocols reviewed and updated unless pt declined. Discussed healthy diet and lifestyle.  

## 2017-08-12 NOTE — Assessment & Plan Note (Signed)
Chronic, stable. Continue current regimen. 

## 2017-08-12 NOTE — Assessment & Plan Note (Signed)
Compliant with CPAP, tolerating well.

## 2017-08-13 ENCOUNTER — Telehealth: Payer: Self-pay | Admitting: *Deleted

## 2017-08-13 DIAGNOSIS — Z87891 Personal history of nicotine dependence: Secondary | ICD-10-CM

## 2017-08-13 DIAGNOSIS — Z122 Encounter for screening for malignant neoplasm of respiratory organs: Secondary | ICD-10-CM

## 2017-08-13 NOTE — Telephone Encounter (Signed)
Received referral for initial lung cancer screening scan. Contacted patient and obtained smoking history,(former, quit 01/06/07, 34 pack year) as well as answering questions related to screening process. Patient denies signs of lung cancer such as weight loss or hemoptysis. Patient denies comorbidity that would prevent curative treatment if lung cancer were found. Patient is scheduled for shared decision making visit and CT scan on 09/02/17.

## 2017-08-13 NOTE — Telephone Encounter (Signed)
Received referral for low dose lung cancer screening CT scan. Message left at phone number listed in EMR for patient to call me back to facilitate scheduling scan.  

## 2017-08-18 ENCOUNTER — Other Ambulatory Visit (INDEPENDENT_AMBULATORY_CARE_PROVIDER_SITE_OTHER): Payer: No Typology Code available for payment source

## 2017-08-18 DIAGNOSIS — R739 Hyperglycemia, unspecified: Secondary | ICD-10-CM | POA: Diagnosis not present

## 2017-08-18 DIAGNOSIS — E782 Mixed hyperlipidemia: Secondary | ICD-10-CM

## 2017-08-18 DIAGNOSIS — Z125 Encounter for screening for malignant neoplasm of prostate: Secondary | ICD-10-CM | POA: Diagnosis not present

## 2017-08-18 DIAGNOSIS — E039 Hypothyroidism, unspecified: Secondary | ICD-10-CM

## 2017-08-18 LAB — PSA: PSA: 3.17 ng/mL (ref 0.10–4.00)

## 2017-08-18 LAB — COMPREHENSIVE METABOLIC PANEL
ALBUMIN: 4.8 g/dL (ref 3.5–5.2)
ALT: 25 U/L (ref 0–53)
AST: 22 U/L (ref 0–37)
Alkaline Phosphatase: 66 U/L (ref 39–117)
BILIRUBIN TOTAL: 0.9 mg/dL (ref 0.2–1.2)
BUN: 16 mg/dL (ref 6–23)
CALCIUM: 10 mg/dL (ref 8.4–10.5)
CO2: 28 mEq/L (ref 19–32)
CREATININE: 1.17 mg/dL (ref 0.40–1.50)
Chloride: 104 mEq/L (ref 96–112)
GFR: 67.31 mL/min (ref >60.00)
Glucose, Bld: 126 mg/dL — ABNORMAL HIGH (ref 70–99)
Potassium: 4.2 mEq/L (ref 3.5–5.1)
SODIUM: 142 meq/L (ref 135–145)
TOTAL PROTEIN: 7.4 g/dL (ref 6.0–8.3)

## 2017-08-18 LAB — HEMOGLOBIN A1C: Hgb A1c MFr Bld: 5.4 % (ref 4.6–6.5)

## 2017-08-18 LAB — LIPID PANEL
CHOLESTEROL: 224 mg/dL — AB (ref 0–200)
HDL: 58.5 mg/dL (ref >39.00)
LDL Cholesterol: 145 mg/dL — ABNORMAL HIGH (ref 0–99)
NonHDL: 165.33
TRIGLYCERIDES: 100 mg/dL (ref 0.0–149.0)
Total CHOL/HDL Ratio: 4
VLDL: 20 mg/dL (ref 0.0–40.0)

## 2017-08-18 LAB — TSH: TSH: 2.88 u[IU]/mL (ref 0.35–4.50)

## 2017-09-01 ENCOUNTER — Encounter: Payer: Self-pay | Admitting: Nurse Practitioner

## 2017-09-02 ENCOUNTER — Ambulatory Visit
Admission: RE | Admit: 2017-09-02 | Discharge: 2017-09-02 | Disposition: A | Discharge status: Home / Self Care | Payer: No Typology Code available for payment source | Source: Ambulatory Visit | Attending: Nurse Practitioner | Admitting: Nurse Practitioner

## 2017-09-02 ENCOUNTER — Inpatient Hospital Stay: Payer: No Typology Code available for payment source | Attending: Nurse Practitioner | Admitting: Nurse Practitioner

## 2017-09-02 DIAGNOSIS — J432 Centrilobular emphysema: Secondary | ICD-10-CM | POA: Insufficient documentation

## 2017-09-02 DIAGNOSIS — Z87891 Personal history of nicotine dependence: Secondary | ICD-10-CM

## 2017-09-02 DIAGNOSIS — I700 Atherosclerosis of aorta: Secondary | ICD-10-CM | POA: Insufficient documentation

## 2017-09-02 DIAGNOSIS — Z122 Encounter for screening for malignant neoplasm of respiratory organs: Secondary | ICD-10-CM | POA: Diagnosis not present

## 2017-09-02 NOTE — Progress Notes (Signed)
In accordance with CMS guidelines, patient has met eligibility criteria including age, absence of signs or symptoms of lung cancer.  Social History   Tobacco Use  . Smoking status: Former Smoker    Packs/day: 1.00    Years: 34.00    Pack years: 34.00    Last attempt to quit: 01/06/2007    Years since quitting: 10.6  . Smokeless tobacco: Never Used  Substance Use Topics  . Alcohol use: Yes  . Drug use: No      A shared decision-making session was conducted prior to the performance of CT scan. This includes one or more decision aids, includes benefits and harms of screening, follow-up diagnostic testing, over-diagnosis, false positive rate, and total radiation exposure.   Counseling on the importance of adherence to annual lung cancer LDCT screening, impact of co-morbidities, and ability or willingness to undergo diagnosis and treatment is imperative for compliance of the program.   Counseling on the importance of continued smoking cessation for former smokers; the importance of smoking cessation for current smokers, and information about tobacco cessation interventions have been given to patient including [facility] and [facility] quit [place] programs.   Written order for lung cancer screening with LDCT has been given to the patient and any and all questions have been answered to the best of my abilities.    Yearly follow up will be coordinated by Burgess Estelle, Thoracic Navigator.  Beckey Rutter, DNP, AGNP-C [place] [facility] [phone] (work cell) [phone] (office) 09/02/17 3:05 PM

## 2017-09-07 ENCOUNTER — Encounter: Payer: Self-pay | Admitting: *Deleted

## 2017-09-08 ENCOUNTER — Encounter: Payer: Self-pay | Admitting: Family Medicine

## 2017-09-08 DIAGNOSIS — J439 Emphysema, unspecified: Secondary | ICD-10-CM | POA: Insufficient documentation

## 2017-09-08 DIAGNOSIS — I700 Atherosclerosis of aorta: Secondary | ICD-10-CM | POA: Insufficient documentation

## 2017-10-27 ENCOUNTER — Encounter: Payer: Self-pay | Admitting: Family Medicine

## 2017-10-30 MED ORDER — LOVASTATIN 40 MG PO TABS: 40.0000 mg | ORAL_TABLET | Freq: Every day | ORAL | 6 refills | Status: DC

## 2017-11-02 NOTE — Progress Notes (Deleted)
Office Visit Note  Patient: Robert Shea             Date of Birth: 1955-07-05           MRN: [id number]             PCP: Ria Bush, MD Referring: Ria Bush, MD Visit Date: 11/16/2017 Occupation: @GUAROCC @  Subjective:  No chief complaint on file.  Has been off prednisone for over 2 years and discontinued low dose methotrexate April 2019.  Current regimen includes allopurinol 300 mg po daily and colchicine 1.2 mg po as needed. Last uric acid 6.2 on 04/02/17.  Most recent CBC/CMP within normal limits. Active order for uric acid/CBC/CMP for today.   History of Present Illness: Robert Shea is a 62 y.o. male with history of polymyalgia rheumatica and gout.  Activities of Daily Living:  Patient reports morning stiffness for *** {minute/hour:19697}.   Patient {ACTIONS;DENIES/REPORTS:21021675::"Denies"} nocturnal pain.  Difficulty dressing/grooming: {ACTIONS;DENIES/REPORTS:21021675::"Denies"} Difficulty climbing stairs: {ACTIONS;DENIES/REPORTS:21021675::"Denies"} Difficulty getting out of chair: {ACTIONS;DENIES/REPORTS:21021675::"Denies"} Difficulty using hands for taps, buttons, cutlery, and/or writing: {ACTIONS;DENIES/REPORTS:21021675::"Denies"}  No Rheumatology ROS completed.   PMFS History:  Patient Active Problem List   Diagnosis Date Noted  . Aortic arch atherosclerosis ([place]) 09/08/2017  . Emphysema of lung ([place]) 09/08/2017  . Travel advice encounter 08/12/2017  . Rheumatoid factor positive 10/06/2016  . Lesion of lip 12/25/2015  . High risk medication use 10/28/2015  . Osteoarthritis of both hands 10/28/2015  . Osteoarthritis of multiple joints 10/28/2015  . Milia 10/25/2015  . OSA on CPAP 10/25/2015  . Thoracic back pain 06/11/2015  . PMR (polymyalgia rheumatica) ([facility]) 01/30/2014  . Low back pain radiating to right lower extremity 01/01/2014  . Erectile dysfunction 09/19/2013  . Osteoarthritis of feet, bilateral 07/26/2013  . Hyperglycemia  03/25/2013  . Obesity, Class I, BMI 30-34.9   . Health care maintenance 03/15/2013  . Carotid stenosis 03/15/2013  . Gout   . HTN (hypertension)   . HLD (hyperlipidemia)   . Hypothyroidism   . History of colon polyps   . Ex-smoker     Past Medical History:  Diagnosis Date  . Ex-smoker 2009   quit  . Gout   . Heart murmur longstanding  . History of chicken pox   . History of colon polyps 2010   benign  . History of measles   . HLD (hyperlipidemia)   . HTN (hypertension) 2010  . Hypothyroidism 2010  . Obesity   . PMR (polymyalgia rheumatica) ([place]) 01/30/2014    Family History  Problem Relation Age of Onset  . Cancer Father 82       bone?  . Cancer Maternal Uncle        throat, smoker  . Diabetes Mother   . Diabetes Brother   . CAD Paternal Grandfather 75       MI  . Stroke Paternal Grandfather   . CAD Brother 76       widowmaker  . Diabetes Brother   . Healthy Son   . Colon cancer Neg Hx   . Pancreatic cancer Neg Hx   . Stomach cancer Neg Hx   . Rectal cancer Neg Hx    Past Surgical History:  Procedure Laterality Date  . COLONOSCOPY  07/2009   2 adenomas rpt 5 yrs Collie Siad)  . COLONOSCOPY  05/2013   1 hyperplastic polyp, rpt 10 yrs Ardis Hughs)  . VASECTOMY  1995   Social History   Social History Narrative   Lives with  wife   [place] time [place] part time local   Occ: VP of sales   Edu: BS   Activity: gym 3x/wk    Diet: healthier recently - good water, fruits/vegetables daily    Objective: Vital Signs: There were no vitals taken for this visit.   Physical Exam   Musculoskeletal Exam: ***  CDAI Exam: CDAI Score: Not documented Patient Global Assessment: Not documented; Provider Global Assessment: Not documented Swollen: Not documented; Tender: Not documented Joint Exam   Not documented   There is currently no information documented on the homunculus. Go to the Rheumatology activity and complete the homunculus joint  exam.  Investigation: No additional findings.  Imaging: No results found.  Recent Labs: Lab Results  Component Value Date   WBC 6.4 04/02/2017   HGB 13.9 04/02/2017   PLT 266 04/02/2017   NA 142 08/18/2017   K 4.2 08/18/2017   CL 104 08/18/2017   CO2 28 08/18/2017   GLUCOSE 126 (H) 08/18/2017   BUN 16 08/18/2017   CREATININE 1.17 08/18/2017   BILITOT 0.9 08/18/2017   ALKPHOS 66 08/18/2017   AST 22 08/18/2017   ALT 25 08/18/2017   PROT 7.4 08/18/2017   ALBUMIN 4.8 08/18/2017   CALCIUM 10.0 08/18/2017   GFRAA 97 04/02/2017    Speciality Comments: No specialty comments available.  Procedures:  No procedures performed Allergies: Uloric [febuxostat]   Assessment / Plan:     Visit Diagnoses: No diagnosis found.   Orders: No orders of the defined types were placed in this encounter.  No orders of the defined types were placed in this encounter.   Face-to-face time spent with patient was *** minutes. Greater than 50% of time was spent in counseling and coordination of care.  Follow-Up Instructions: No follow-ups on file.   Earnestine Mealing, CMA  Note - This record has been created using Editor, commissioning.  Chart creation errors have been sought, but may not always  have been located. Such creation errors do not reflect on  the standard of medical care.

## 2017-11-15 ENCOUNTER — Encounter: Payer: Self-pay | Admitting: Rheumatology

## 2017-11-16 ENCOUNTER — Ambulatory Visit: Payer: No Typology Code available for payment source | Admitting: Rheumatology

## 2017-11-16 NOTE — Progress Notes (Signed)
Office Visit Note  Patient: Robert Shea             Date of Birth: 12/31/1955           MRN: [id number]             PCP: Ria Bush, MD Referring: Ria Bush, MD Visit Date: 11/18/2017 Occupation: @GUAROCC @  Subjective:  Medication monitoring     History of Present Illness: Robert Shea is a 62 y.o. male with history of PMR, gout, and osteoarthritis.  He is on Allopurinol 300 mg by mouth daily and mitigare 0.6 mg BID PRN. He has been off of prednisone for 2 years and discontinued MTX after his last visit in April 2019.  He has not noticed any difference since discontinuing MTX. He has not had any new or worsening PMR symptoms.  He states that he plays in a softball league and has had some increased muscle soreness but feels as though it is related to being more active lately.  He states that the muscle soreness has been improving over the past month or so.  He denies any muscle weakness.  Denies any difficulty getting up from a chair raising his arms above his head.  He reports that the pain does not feel like PMR. He denies any recent gout flares.  He states he tries to avoid eating mussels and drinking beer due to this being a trigger in the past.  He has not needed to take Medicare in several years.  His right lateral epicondylitis has improved.  He denies any other joint pain or joint swelling at this time.     Activities of Daily Living:  Patient reports morning stiffness for 5 minutes.   Patient Denies nocturnal pain.  Difficulty dressing/grooming: Denies Difficulty climbing stairs: Denies Difficulty getting out of chair: Denies Difficulty using hands for taps, buttons, cutlery, and/or writing: Denies  Review of Systems  Constitutional: Positive for fatigue. Negative for night sweats.  HENT: Negative for mouth sores, trouble swallowing, trouble swallowing, mouth dryness and nose dryness.   Eyes: Positive for dryness. Negative for redness and visual  disturbance.  Respiratory: Negative for cough, hemoptysis, shortness of breath and difficulty breathing.   Cardiovascular: Negative for chest pain, palpitations, hypertension, irregular heartbeat and swelling in legs/feet.  Gastrointestinal: Positive for constipation. Negative for blood in stool and diarrhea.  Endocrine: Negative for increased urination.  Genitourinary: Negative for painful urination.  Musculoskeletal: Positive for myalgias, morning stiffness, muscle tenderness and myalgias. Negative for arthralgias, joint pain, joint swelling and muscle weakness.  Skin: Negative for color change, rash, hair loss, nodules/bumps, skin tightness, ulcers and sensitivity to sunlight.  Allergic/Immunologic: Negative for susceptible to infections.  Neurological: Negative for dizziness, fainting, memory loss, night sweats and weakness.  Hematological: Negative for swollen glands.  Psychiatric/Behavioral: Negative for depressed mood and sleep disturbance. The patient is not nervous/anxious.     PMFS History:  Patient Active Problem List   Diagnosis Date Noted  . Aortic arch atherosclerosis ([place]) 09/08/2017  . Emphysema of lung ([place]) 09/08/2017  . Travel advice encounter 08/12/2017  . Rheumatoid factor positive 10/06/2016  . Lesion of lip 12/25/2015  . High risk medication use 10/28/2015  . Osteoarthritis of both hands 10/28/2015  . Osteoarthritis of multiple joints 10/28/2015  . Milia 10/25/2015  . OSA on CPAP 10/25/2015  . Thoracic back pain 06/11/2015  . PMR (polymyalgia rheumatica) ([facility]) 01/30/2014  . Low back pain radiating to right lower extremity 01/01/2014  .  Erectile dysfunction 09/19/2013  . Osteoarthritis of feet, bilateral 07/26/2013  . Hyperglycemia 03/25/2013  . Obesity, Class I, BMI 30-34.9   . Health care maintenance 03/15/2013  . Carotid stenosis 03/15/2013  . Gout   . HTN (hypertension)   . HLD (hyperlipidemia)   . Hypothyroidism   . History of colon polyps   .  Ex-smoker     Past Medical History:  Diagnosis Date  . Ex-smoker 2009   quit  . Gout   . Heart murmur longstanding  . History of chicken pox   . History of colon polyps 2010   benign  . History of measles   . HLD (hyperlipidemia)   . HTN (hypertension) 2010  . Hypothyroidism 2010  . Obesity   . PMR (polymyalgia rheumatica) ([place]) 01/30/2014    Family History  Problem Relation Age of Onset  . Cancer Father 105       bone?  . Cancer Maternal Uncle        throat, smoker  . Diabetes Mother   . Diabetes Brother   . CAD Paternal Grandfather 21       MI  . Stroke Paternal Grandfather   . CAD Brother 37       widowmaker  . Diabetes Brother   . Healthy Son   . Colon cancer Neg Hx   . Pancreatic cancer Neg Hx   . Stomach cancer Neg Hx   . Rectal cancer Neg Hx    Past Surgical History:  Procedure Laterality Date  . COLONOSCOPY  07/2009   2 adenomas rpt 5 yrs Collie Siad)  . COLONOSCOPY  05/2013   1 hyperplastic polyp, rpt 10 yrs Ardis Hughs)  . [place] History   Social History Narrative   Lives with wife   [place] time lives [place] part time local   Occ: VP of sales   Edu: BS   Activity: gym 3x/wk    Diet: healthier recently - good water, fruits/vegetables daily    Objective: Vital Signs: BP (!) 159/70 (BP Location: Left Arm, Patient Position: Sitting, Cuff Size: Normal)   Pulse 74   Resp 16   Ht 6\' 2"  (1.88 m)   Wt 271 lb (122.9 kg)   BMI 34.79 kg/m    Physical Exam  Constitutional: He is oriented to person, place, and time. He appears well-developed and well-nourished.  HENT:  Head: Normocephalic and atraumatic.  Eyes: Pupils are equal, round, and reactive to light. Conjunctivae and EOM are normal.  Neck: Normal range of motion. Neck supple.  Cardiovascular: Normal rate and regular rhythm.  Murmur heard. Pulmonary/Chest: Effort normal and breath sounds normal.  Abdominal: Soft. Bowel sounds are normal.  Lymphadenopathy:    He has no cervical  adenopathy.  Neurological: He is alert and oriented to person, place, and time.  Skin: Skin is warm and dry. Capillary refill takes less than 2 seconds.  Psychiatric: He has a normal mood and affect. His behavior is normal.  Nursing note and vitals reviewed.    Musculoskeletal Exam: C-spine limited range of motion with lateral rotation.  Thoracic lumbar spine good range of motion.  No midline spinal tenderness.  No SI joint tenderness.  Shoulder joints, elbow joints, wrist joints, MCPs and PIPs, DIPs good range of motion no synovitis.  He has mild PIP and DIP synovial thickening consistent with osteoarthritis of bilateral hands.  He has complete fist formation bilaterally.  Hip joints good range of motion with no discomfort.  No tenderness of trochanter bursa bilaterally.  Knee joints, ankle joints, MTPs and PIPs and DIPs good range of motion no synovitis.  No warmth or effusion bilateral knee joints.  No tenderness or swelling of ankle joints.  CDAI Exam: CDAI Score: Not documented Patient Global Assessment: Not documented; Provider Global Assessment: Not documented Swollen: Not documented; Tender: Not documented Joint Exam   Not documented   There is currently no information documented on the homunculus. Go to the Rheumatology activity and complete the homunculus joint exam.  Investigation: No additional findings.  Imaging: No results found.  Recent Labs: Lab Results  Component Value Date   WBC 6.4 04/02/2017   HGB 13.9 04/02/2017   PLT 266 04/02/2017   NA 142 08/18/2017   K 4.2 08/18/2017   CL 104 08/18/2017   CO2 28 08/18/2017   GLUCOSE 126 (H) 08/18/2017   BUN 16 08/18/2017   CREATININE 1.17 08/18/2017   BILITOT 0.9 08/18/2017   ALKPHOS 66 08/18/2017   AST 22 08/18/2017   ALT 25 08/18/2017   PROT 7.4 08/18/2017   ALBUMIN 4.8 08/18/2017   CALCIUM 10.0 08/18/2017   GFRAA 97 04/02/2017    Speciality Comments: No specialty comments available.  Procedures:  No  procedures performed Allergies: Uloric [febuxostat]    Assessment / Plan:     Visit Diagnoses: PMR (polymyalgia rheumatica) ([place]): He has not had any recent recurrences. He discontinued methotrexate in April 2019 due to clinically doing well.  He has not noticed any new or worsening symptoms since discontinuing.  He has been off of prednisone for 2 years.  He has had some muscle soreness over the past 2 months which he attributes to being active in a softball league.  The muscle tenderness has been improving.  He does not feel that the pain is similar to when he had PMR flare.  He had no difficulty getting up from a chair raising his arm above his head.  He will notify us if he develops any new or worsening symptoms.  High risk medication use - He discontinued MTX and folic acid in April 0923.   Idiopathic chronic gout of multiple sites without tophus -He has not had any recent gout flares.  He takes allopurinol 300 mg 1 tablet by mouth daily and Mitigare 0.6 mg twice daily as needed during a flare.  He has not needed to take [place] for several years.  He tries to avoid triggers such as beer and mussels.  He does not need any refills of allopurinol or Mitigare at this time.  Most recent uric acid: 04/02/2017 6.2.  He will be having lab work drawn tomorrow which will be faxed to [place].  Lateral epicondylitis of right elbow: Improved.   Primary osteoarthritis of both hands: He has had PIP and DIP synovial thickening consistent with osteoarthritis of bilateral hands.  He has complete fist formation bilaterally.  He has no synovitis on exam.  He has no joint pain at this time.  Joint protection and muscle strengthening were discussed.  Primary osteoarthritis of both hips: Good range of motion with no discomfort.  He has no groin pain at this time.  Primary osteoarthritis of both feet: He has osteoarthritic changes in both feet.  He has no discomfort at this time.  He wears proper fitting shoes.   Other  medical conditions are listed as follows:   History of hypertension  History of hyperglycemia  History of hyperlipidemia  History of hypothyroidism  History of obesity  Ex-smoker   Orders: No orders of the defined types were placed in this encounter.  No orders of the defined types were placed in this encounter.     Follow-Up Instructions: Return for Polymyalgia Rheumatica, Gout, Osteoarthritis.   Ofilia Neas, PA-C   I examined and evaluated the patient with Hazel Sams PA.  She is clinically doing well without any muscular weakness or tenderness.  He has been off and is on methotrexate.  His gout is well controlled on allopurinol.  The plan of care was discussed as noted above.  Bo Merino, MD  Note - This record has been created using Editor, commissioning.  Chart creation errors have been sought, but may not always  have been located. Such creation errors do not reflect on  the standard of medical care.

## 2017-11-18 ENCOUNTER — Ambulatory Visit (INDEPENDENT_AMBULATORY_CARE_PROVIDER_SITE_OTHER): Payer: No Typology Code available for payment source | Admitting: Rheumatology

## 2017-11-18 ENCOUNTER — Encounter: Payer: Self-pay | Admitting: Family Medicine

## 2017-11-18 ENCOUNTER — Encounter: Payer: Self-pay | Admitting: Rheumatology

## 2017-11-18 VITALS — BP 159/70 | HR 74 | Resp 16 | Ht 74.0 in | Wt 271.0 lb

## 2017-11-18 DIAGNOSIS — M19042 Primary osteoarthritis, left hand: Secondary | ICD-10-CM

## 2017-11-18 DIAGNOSIS — Z8679 Personal history of other diseases of the circulatory system: Secondary | ICD-10-CM

## 2017-11-18 DIAGNOSIS — M353 Polymyalgia rheumatica: Secondary | ICD-10-CM | POA: Diagnosis not present

## 2017-11-18 DIAGNOSIS — M19071 Primary osteoarthritis, right ankle and foot: Secondary | ICD-10-CM

## 2017-11-18 DIAGNOSIS — M19072 Primary osteoarthritis, left ankle and foot: Secondary | ICD-10-CM

## 2017-11-18 DIAGNOSIS — M1A09X0 Idiopathic chronic gout, multiple sites, without tophus (tophi): Secondary | ICD-10-CM

## 2017-11-18 DIAGNOSIS — Z8639 Personal history of other endocrine, nutritional and metabolic disease: Secondary | ICD-10-CM

## 2017-11-18 DIAGNOSIS — M160 Bilateral primary osteoarthritis of hip: Secondary | ICD-10-CM

## 2017-11-18 DIAGNOSIS — M7711 Lateral epicondylitis, right elbow: Secondary | ICD-10-CM | POA: Diagnosis not present

## 2017-11-18 DIAGNOSIS — Z79899 Other long term (current) drug therapy: Secondary | ICD-10-CM | POA: Diagnosis not present

## 2017-11-18 DIAGNOSIS — M19041 Primary osteoarthritis, right hand: Secondary | ICD-10-CM

## 2017-11-18 DIAGNOSIS — Z87891 Personal history of nicotine dependence: Secondary | ICD-10-CM

## 2017-11-18 NOTE — Patient Instructions (Signed)
Please have CBC, CMP, and uric acid drawn

## 2017-11-22 MED ORDER — PHENTERMINE HCL 30 MG PO CAPS: 30.0000 mg | ORAL_CAPSULE | ORAL | 0 refills | Status: DC

## 2017-12-21 ENCOUNTER — Ambulatory Visit (INDEPENDENT_AMBULATORY_CARE_PROVIDER_SITE_OTHER): Payer: No Typology Code available for payment source | Admitting: Family Medicine

## 2017-12-21 ENCOUNTER — Encounter: Payer: Self-pay | Admitting: Family Medicine

## 2017-12-21 VITALS — BP 130/74 | HR 69 | Temp 98.4°F | Ht 74.0 in | Wt 261.5 lb

## 2017-12-21 DIAGNOSIS — R972 Elevated prostate specific antigen [PSA]: Secondary | ICD-10-CM | POA: Diagnosis not present

## 2017-12-21 DIAGNOSIS — Z23 Encounter for immunization: Secondary | ICD-10-CM

## 2017-12-21 DIAGNOSIS — E669 Obesity, unspecified: Secondary | ICD-10-CM

## 2017-12-21 DIAGNOSIS — E782 Mixed hyperlipidemia: Secondary | ICD-10-CM | POA: Diagnosis not present

## 2017-12-21 DIAGNOSIS — I10 Essential (primary) hypertension: Secondary | ICD-10-CM | POA: Diagnosis not present

## 2017-12-21 LAB — LIPID PANEL
CHOL/HDL RATIO: 3
Cholesterol: 158 mg/dL (ref 0–200)
HDL: 51.5 mg/dL (ref >39.00)
LDL CALC: 79 mg/dL (ref 0–99)
NONHDL: 106.97
Triglycerides: 140 mg/dL (ref 0.0–149.0)
VLDL: 28 mg/dL (ref 0.0–40.0)

## 2017-12-21 MED ORDER — PHENTERMINE HCL 30 MG PO CAPS: 30.0000 mg | ORAL_CAPSULE | ORAL | 1 refills | Status: DC

## 2017-12-21 NOTE — Progress Notes (Signed)
BP 130/74 (BP Location: Left Arm, Patient Position: Sitting, Cuff Size: Large)   Pulse 69   Temp 98.4 F (36.9 C) (Oral)   Ht 6\' 2"  (1.88 m)   Wt 261 lb 8 oz (118.6 kg)   SpO2 98%   BMI 33.57 kg/m    CC: f/u phentermine visit Subjective:    Patient ID: Robert Shea, male    DOB: [birth year], 62 y.o.   MRN: [id number]  HPI: Robert Shea is a 62 y.o. male presenting on 12/21/2017 for Follow-up (Here for 1 mo phentermine f/u. ) and Other (States he did not have prostate exam at recent CPE. Told PSA was higher. Requesting prostate exam today. )   Starting weight 271 lbs.  Today's weight 261 lbs.   Currently on phentermine 30mg  daily (takes 4-5 times a week). Tolerating well without HA, chest pain, insomnia, appetite stable. Finds he does well when he has a routine.   HLD - simvastatin caused joint pains. Started on lovastatin 10/2017.  PMR followed by rheum Dr Estanislado Pandy. Last BP in rheum office elevated to 159/70.   Increased PSA velocity - no change in nocturia x1, weakening of stream.   Relevant past medical, surgical, family and social history reviewed and updated as indicated. Interim medical history since our last visit reviewed. Allergies and medications reviewed and updated. Outpatient Medications Prior to Visit  Medication Sig Dispense Refill  . allopurinol (ZYLOPRIM) 300 MG tablet TAKE 1 TABLET BY MOUTH DAILY 90 tablet 1  . amLODipine (NORVASC) 10 MG tablet Take 1 tablet (10 mg total) by mouth daily. 90 tablet 3  . levothyroxine (SYNTHROID, LEVOTHROID) 100 MCG tablet Take 1 tablet (100 mcg total) by mouth daily before breakfast. 90 tablet 3  . lovastatin (MEVACOR) 40 MG tablet Take 1 tablet (40 mg total) by mouth at bedtime. 30 tablet 6  . MITIGARE 0.6 MG CAPS TAKE 2 TABLETS BY MOUTH DAILY AS NEEDED 60 capsule 2  . sildenafil (VIAGRA) 100 MG tablet TAKE 1/2 TO 1 TABLET EVERY DAY AS NEEDED 10 tablet 1  . phentermine 30 MG capsule Take 1 capsule (30 mg total) by mouth  every morning. 30 capsule 0  . zolpidem (AMBIEN) 10 MG tablet Take 1 tablet (10 mg total) by mouth at bedtime as needed for sleep. 10 tablet 0  . ciprofloxacin (CIPRO) 500 MG tablet Take 1 tablet (500 mg total) by mouth 2 (two) times daily. (Patient not taking: Reported on 11/18/2017) 6 tablet 0   No facility-administered medications prior to visit.      Per HPI unless specifically indicated in ROS section below Review of Systems     Objective:    BP 130/74 (BP Location: Left Arm, Patient Position: Sitting, Cuff Size: Large)   Pulse 69   Temp 98.4 F (36.9 C) (Oral)   Ht 6\' 2"  (1.88 m)   Wt 261 lb 8 oz (118.6 kg)   SpO2 98%   BMI 33.57 kg/m   Wt Readings from Last 3 Encounters:  12/21/17 261 lb 8 oz (118.6 kg)  11/18/17 271 lb (122.9 kg)  09/02/17 255 lb (115.7 kg)    Physical Exam Vitals signs and nursing note reviewed.  Constitutional:      General: He is not in acute distress.    Appearance: Normal appearance.  HENT:     Mouth/Throat:     Mouth: Mucous membranes are moist.     Pharynx: No oropharyngeal exudate.  Cardiovascular:     Rate and  Rhythm: Normal rate and regular rhythm.     Pulses: Normal pulses.     Heart sounds: Normal heart sounds. No murmur.  Pulmonary:     Effort: Pulmonary effort is normal. No respiratory distress.     Breath sounds: No wheezing, rhonchi or rales.  Genitourinary:    Prostate: Normal. Not enlarged (20gm).     Rectum: Normal. No mass, tenderness, anal fissure, external hemorrhoid or internal hemorrhoid. Normal anal tone.  Musculoskeletal:        General: No swelling.     Right lower leg: No edema.     Left lower leg: No edema.  Skin:    General: Skin is warm and dry.     Findings: No rash.  Neurological:     Mental Status: He is alert.    Results for orders placed or performed in visit on 08/18/17  PSA  Result Value Ref Range   PSA 3.17 0.10 - 4.00 ng/mL  Hemoglobin A1c  Result Value Ref Range   Hgb A1c MFr Bld 5.4 4.6 -  6.5 %  TSH  Result Value Ref Range   TSH 2.88 0.35 - 4.50 uIU/mL  Comprehensive metabolic panel  Result Value Ref Range   Sodium 142 135 - 145 mEq/L   Potassium 4.2 3.5 - 5.1 mEq/L   Chloride 104 96 - 112 mEq/L   CO2 28 19 - 32 mEq/L   Glucose, Bld 126 (H) 70 - 99 mg/dL   BUN 16 6 - 23 mg/dL   Creatinine, Ser 1.17 0.40 - 1.50 mg/dL   Total Bilirubin 0.9 0.2 - 1.2 mg/dL   Alkaline Phosphatase 66 39 - 117 U/L   AST 22 0 - 37 U/L   ALT 25 0 - 53 U/L   Total Protein 7.4 6.0 - 8.3 g/dL   Albumin 4.8 3.5 - 5.2 g/dL   Calcium 10.0 8.4 - 10.5 mg/dL   GFR 67.31 >60.00 mL/min  Lipid panel  Result Value Ref Range   Cholesterol 224 (H) 0 - 200 mg/dL   Triglycerides 100.0 0.0 - 149.0 mg/dL   HDL 58.50 >39.00 mg/dL   VLDL 20.0 0.0 - 40.0 mg/dL   LDL Cholesterol 145 (H) 0 - 99 mg/dL   Total CHOL/HDL Ratio 4    NonHDL 165.33    Lab Results  Component Value Date   PSA 3.17 08/18/2017   PSA 1.79 05/25/2016   PSA 1.73 04/08/2015    Lab Results  Component Value Date   WBC 6.4 04/02/2017   HGB 13.9 04/02/2017   HCT 40.2 04/02/2017   MCV 93.3 04/02/2017   PLT 266 04/02/2017       Assessment & Plan:   Problem List Items Addressed This Visit    Obesity, Class I, BMI 30-34.9 - Primary    We have restarted phentermine 11/2017 which he is tolerating well.  Finds weight directly correlates to arthralgias and hypertension.  RTC 6 wks f/u visit       Increased prostate specific antigen (PSA) velocity    PSA trended up from 1.7 to 3.2. DRE today (not checked at physical). Will repeat PSA today as well.       Relevant Orders   PSA, Total with Reflex to PSA, Free   HTN (hypertension)    Chronic, stable on amlodipine 10mg  daily.      HLD (hyperlipidemia)    Tolerating lovastatin better. Will check FLP today.       Relevant Orders   Lipid panel  Other Visit Diagnoses    Need for influenza vaccination       Relevant Orders   Flu Vaccine QUAD 36+ mos IM (Completed)        Meds ordered this encounter  Medications  . phentermine 30 MG capsule    Sig: Take 1 capsule (30 mg total) by mouth every morning.    Dispense:  30 capsule    Refill:  1   Orders Placed This Encounter  Procedures  . Flu Vaccine QUAD 36+ mos IM  . PSA, Total with Reflex to PSA, Free  . Lipid panel    Follow up plan: Return in about 6 weeks (around 02/01/2018) for follow up visit.  Ria Bush, MD

## 2017-12-21 NOTE — Assessment & Plan Note (Signed)
Tolerating lovastatin better. Will check FLP today.

## 2017-12-21 NOTE — Assessment & Plan Note (Addendum)
We have restarted phentermine 11/2017 which he is tolerating well.  Finds weight directly correlates to arthralgias and hypertension.  RTC 6 wks f/u visit

## 2017-12-21 NOTE — Patient Instructions (Addendum)
Flu shot today Labs today Prostate feeling normal.  Phentermine refilled.  Return in 6 weeks for follow up visit.

## 2017-12-21 NOTE — Assessment & Plan Note (Signed)
Chronic, stable on amlodipine 10mg  daily.

## 2017-12-21 NOTE — Assessment & Plan Note (Addendum)
PSA trended up from 1.7 to 3.2. DRE today (not checked at physical). Will repeat PSA today as well.

## 2017-12-22 LAB — PSA, TOTAL WITH REFLEX TO PSA, FREE: PSA, TOTAL: 3 ng/mL (ref <=4.0)

## 2018-02-21 ENCOUNTER — Other Ambulatory Visit: Payer: Self-pay | Admitting: Rheumatology

## 2018-02-21 NOTE — Telephone Encounter (Signed)
Last Visit: 11/18/17 Next visit due May 2020. Message sent to the front to schedule patient. Labs: 08/18/17 Elevated glucose   Patient will update labs next week   Okay to refill 30 day supply per Dr. Estanislado Pandy

## 2018-02-24 ENCOUNTER — Other Ambulatory Visit: Payer: Self-pay | Admitting: Family Medicine

## 2018-02-24 NOTE — Telephone Encounter (Signed)
Name of Medication: Phentermine Name of Pharmacy: [facility] or Written Date and Quantity: 01/25/18 Last Office Visit and Type: 12/21/17, f/u Next Office Visit and Type: none Last Controlled Substance Agreement Date: none Last UDS: none

## 2018-02-26 NOTE — Telephone Encounter (Signed)
Refilled x1. Will need OV prior to more refills.

## 2018-03-21 ENCOUNTER — Encounter: Payer: Self-pay | Admitting: Physician Assistant

## 2018-03-21 ENCOUNTER — Telehealth: Payer: No Typology Code available for payment source | Admitting: Physician Assistant

## 2018-03-21 ENCOUNTER — Encounter: Payer: Self-pay | Admitting: Family Medicine

## 2018-03-21 DIAGNOSIS — J208 Acute bronchitis due to other specified organisms: Secondary | ICD-10-CM

## 2018-03-21 DIAGNOSIS — B9689 Other specified bacterial agents as the cause of diseases classified elsewhere: Secondary | ICD-10-CM | POA: Diagnosis not present

## 2018-03-21 MED ORDER — AZITHROMYCIN 250 MG PO TABS: ORAL_TABLET | ORAL | 0 refills | Status: DC

## 2018-03-21 MED ORDER — BENZONATATE 100 MG PO CAPS: 100.0000 mg | ORAL_CAPSULE | Freq: Three times a day (TID) | ORAL | 0 refills | Status: DC | PRN

## 2018-03-21 NOTE — Progress Notes (Signed)
We are sorry that you are not feeling well.  Here is how we plan to help!  Based on your presentation I believe you most likely have A cough due to bacteria.  When patients have a fever and a productive cough with a change in color or increased sputum production, we are concerned about bacterial bronchitis.  If left untreated it can progress to pneumonia.  If your symptoms do not improve with your treatment plan it is important that you contact your provider.   I have prescribed Azithromyin 250 mg: two tablets now and then one tablet daily for 4 additonal days    In addition you may use A prescription cough medication called Tessalon Perles 100mg . You may take 1-2 capsules every 8 hours as needed for your cough.  Antibiotic prescribed due to chronicity and non resolving symptoms.    You symptoms are less likely indicative of Flu due to lack of acute onset, and lack of fever and headache.   From your responses in the eVisit questionnaire you describe inflammation in the upper respiratory tract which is causing a significant cough.  This is commonly called Bronchitis and has four common causes:    Allergies  Viral Infections  Acid Reflux  Bacterial Infection Allergies, viruses and acid reflux are treated by controlling symptoms or eliminating the cause. An example might be a cough caused by taking certain blood pressure medications. You stop the cough by changing the medication. Another example might be a cough caused by acid reflux. Controlling the reflux helps control the cough.  USE OF BRONCHODILATOR ("RESCUE") INHALERS: There is a risk from using your bronchodilator too frequently.  The risk is that over-reliance on a medication which only relaxes the muscles surrounding the breathing tubes can reduce the effectiveness of medications prescribed to reduce swelling and congestion of the tubes themselves.  Although you feel brief relief from the bronchodilator inhaler, your asthma may actually  be worsening with the tubes becoming more swollen and filled with mucus.  This can delay other crucial treatments, such as oral steroid medications. If you need to use a bronchodilator inhaler daily, several times per day, you should discuss this with your provider.  There are probably better treatments that could be used to keep your asthma under control.     HOME CARE . Only take medications as instructed by your medical team. . Complete the entire course of an antibiotic. . Drink plenty of fluids and get plenty of rest. . Avoid close contacts especially the very young and the elderly . Cover your mouth if you cough or cough into your sleeve. . Always remember to wash your hands . A steam or ultrasonic humidifier can help congestion.   GET HELP RIGHT AWAY IF: . You develop worsening fever. . You become short of breath . You cough up blood. . Your symptoms persist after you have completed your treatment plan MAKE SURE YOU   Understand these instructions.  Will watch your condition.  Will get help right away if you are not doing well or get worse.  Your e-visit answers were reviewed by a board certified advanced clinical practitioner to complete your personal care plan.  Depending on the condition, your plan could have included both over the counter or prescription medications. If there is a problem please reply  once you have received a response from your provider. Your safety is important to [place].  If you have drug allergies check your prescription carefully.    You  can use MyChart to ask questions about today's visit, request a non-urgent call back, or ask for a work or school excuse for 24 hours related to this e-Visit. If it has been greater than 24 hours you will need to follow up with your provider, or enter a new e-Visit to address those concerns. You will get an e-mail in the next two days asking about your experience.  I hope that your e-visit has been valuable and will speed  your recovery. Thank you for using e-visits.

## 2018-03-26 ENCOUNTER — Other Ambulatory Visit: Payer: Self-pay | Admitting: Rheumatology

## 2018-03-26 DIAGNOSIS — M1A09X0 Idiopathic chronic gout, multiple sites, without tophus (tophi): Secondary | ICD-10-CM

## 2018-03-28 NOTE — Telephone Encounter (Signed)
Last Visit: 11/18/2017 Next Visit: message sent to the front desk to schedule patient.  Labs: 08/18/2017  Elevated glucose   Advised patient he is due to update labs and he states he will get labs drawn this week.   Okay to refill per Dr. Estanislado Pandy.

## 2018-03-30 ENCOUNTER — Other Ambulatory Visit: Payer: Self-pay | Admitting: *Deleted

## 2018-03-30 DIAGNOSIS — Z79899 Other long term (current) drug therapy: Secondary | ICD-10-CM

## 2018-03-30 DIAGNOSIS — M1A09X0 Idiopathic chronic gout, multiple sites, without tophus (tophi): Secondary | ICD-10-CM

## 2018-03-30 LAB — CBC WITH DIFFERENTIAL/PLATELET
ABSOLUTE MONOCYTES: 556 {cells}/uL (ref 200–950)
Basophils Absolute: 47 cells/uL (ref 0–200)
Basophils Relative: 0.7 %
Eosinophils Absolute: 241 cells/uL (ref 15–500)
Eosinophils Relative: 3.6 %
HCT: 40.5 % (ref 38.5–50.0)
Hemoglobin: 14.1 g/dL (ref 13.2–17.1)
Lymphs Abs: 1608 cells/uL (ref 850–3900)
MCH: 32.4 pg (ref 27.0–33.0)
MCHC: 34.8 g/dL (ref 32.0–36.0)
MCV: 93.1 fL (ref 80.0–100.0)
MPV: 10 fL (ref 7.5–12.5)
Monocytes Relative: 8.3 %
Neutro Abs: 4248 cells/uL (ref 1500–7800)
Neutrophils Relative %: 63.4 %
PLATELETS: 256 10*3/uL (ref 140–400)
RBC: 4.35 10*6/uL (ref 4.20–5.80)
RDW: 11.8 % (ref 11.0–15.0)
Total Lymphocyte: 24 %
WBC: 6.7 10*3/uL (ref 3.8–10.8)

## 2018-03-30 LAB — COMPLETE METABOLIC PANEL WITH GFR
AG RATIO: 2.2 (calc) (ref 1.0–2.5)
ALT: 24 U/L (ref 9–46)
AST: 25 U/L (ref 10–35)
Albumin: 4.9 g/dL (ref 3.6–5.1)
Alkaline phosphatase (APISO): 76 U/L (ref 35–144)
BUN: 19 mg/dL (ref 7–25)
CO2: 26 mmol/L (ref 20–32)
Calcium: 9.6 mg/dL (ref 8.6–10.3)
Chloride: 107 mmol/L (ref 98–110)
Creat: 0.96 mg/dL (ref 0.70–1.25)
GFR, Est African American: 98 mL/min/{1.73_m2} (ref >=60)
GFR, Est Non African American: 85 mL/min/{1.73_m2} (ref >=60)
Globulin: 2.2 g/dL (calc) (ref 1.9–3.7)
Glucose, Bld: 104 mg/dL — ABNORMAL HIGH (ref 65–99)
Potassium: 4.1 mmol/L (ref 3.5–5.3)
Sodium: 141 mmol/L (ref 135–146)
TOTAL PROTEIN: 7.1 g/dL (ref 6.1–8.1)
Total Bilirubin: 0.5 mg/dL (ref 0.2–1.2)

## 2018-03-30 LAB — URIC ACID: Uric Acid, Serum: 4.8 mg/dL (ref 4.0–8.0)

## 2018-03-31 ENCOUNTER — Encounter: Payer: Self-pay | Admitting: Rheumatology

## 2018-04-01 MED ORDER — MITIGARE 0.6 MG PO CAPS: ORAL_CAPSULE | ORAL | 2 refills | Status: DC

## 2018-04-01 NOTE — Telephone Encounter (Signed)
Last Visit: 11/18/2017 Next Visit: message sent to the front desk to schedule patient.  Labs: 3/25/20Uric acid within desirable range. CBC WNL.  Glucose is 104. Rest of CMP is WNL.  Okay to refill per Dr. Estanislado Pandy.

## 2018-04-21 ENCOUNTER — Other Ambulatory Visit: Payer: Self-pay | Admitting: Rheumatology

## 2018-04-21 NOTE — Telephone Encounter (Signed)
Last Visit: 11/18/2017 Next Visit: 06/23/18  Labs: 03/30/18 Uric acid within desirable range. CBC WNL.  Glucose is 104. Rest of CMP is WNL.  Okay to refill per Dr. Estanislado Pandy

## 2018-05-13 ENCOUNTER — Other Ambulatory Visit: Payer: Self-pay | Admitting: Family Medicine

## 2018-05-13 NOTE — Telephone Encounter (Signed)
Electronic refill request Phentermine Last refill 02/26/18 #30 Last office visit 12/21/17

## 2018-05-17 ENCOUNTER — Other Ambulatory Visit: Payer: Self-pay | Admitting: Family Medicine

## 2018-05-17 MED ORDER — PHENTERMINE HCL 30 MG PO CAPS: 30.0000 mg | ORAL_CAPSULE | ORAL | 0 refills | Status: DC

## 2018-05-17 NOTE — Telephone Encounter (Signed)
E Prescribed. Replied through [employer].

## 2018-06-09 NOTE — Progress Notes (Deleted)
Office Visit Note  Patient: Robert Shea             Date of Birth: [birth year]           MRN: [id number]             PCP: Ria Bush, MD Referring: Ria Bush, MD Visit Date: 06/23/2018 Occupation: @GUAROCC @  Subjective:  No chief complaint on file.   History of Present Illness: Robert Shea is a 63 y.o. male ***   Activities of Daily Living:  Patient reports morning stiffness for *** {minute/hour:19697}.   Patient {ACTIONS;DENIES/REPORTS:21021675::"Denies"} nocturnal pain.  Difficulty dressing/grooming: {ACTIONS;DENIES/REPORTS:21021675::"Denies"} Difficulty climbing stairs: {ACTIONS;DENIES/REPORTS:21021675::"Denies"} Difficulty getting out of chair: {ACTIONS;DENIES/REPORTS:21021675::"Denies"} Difficulty using hands for taps, buttons, cutlery, and/or writing: {ACTIONS;DENIES/REPORTS:21021675::"Denies"}  No Rheumatology ROS completed.   PMFS History:  Patient Active Problem List   Diagnosis Date Noted  . Increased prostate specific antigen (PSA) velocity 12/21/2017  . Aortic arch atherosclerosis ([place]) 09/08/2017  . Emphysema of lung ([place]) 09/08/2017  . Travel advice encounter 08/12/2017  . Rheumatoid factor positive 10/06/2016  . Lesion of lip 12/25/2015  . High risk medication use 10/28/2015  . Osteoarthritis of both hands 10/28/2015  . Osteoarthritis of multiple joints 10/28/2015  . Milia 10/25/2015  . OSA on CPAP 10/25/2015  . Thoracic back pain 06/11/2015  . PMR (polymyalgia rheumatica) ([facility]) 01/30/2014  . Low back pain radiating to right lower extremity 01/01/2014  . Erectile dysfunction 09/19/2013  . Osteoarthritis of feet, bilateral 07/26/2013  . Hyperglycemia 03/25/2013  . Obesity, Class I, BMI 30-34.9   . Health care maintenance 03/15/2013  . Carotid stenosis 03/15/2013  . Gout   . HTN (hypertension)   . HLD (hyperlipidemia)   . Hypothyroidism   . History of colon polyps   . Ex-smoker     Past Medical History:  Diagnosis Date  .  Ex-smoker 2009   quit  . Gout   . Heart murmur longstanding  . History of chicken pox   . History of colon polyps 2010   benign  . History of measles   . HLD (hyperlipidemia)   . HTN (hypertension) 2010  . Hypothyroidism 2010  . Obesity   . PMR (polymyalgia rheumatica) ([place]) 01/30/2014    Family History  Problem Relation Age of Onset  . Cancer Father 32       bone?  . Cancer Maternal Uncle        throat, smoker  . Diabetes Mother   . Diabetes Brother   . CAD Paternal Grandfather 40       MI  . Stroke Paternal Grandfather   . CAD Brother 39       widowmaker  . Diabetes Brother   . Healthy Son   . Colon cancer Neg Hx   . Pancreatic cancer Neg Hx   . Stomach cancer Neg Hx   . Rectal cancer Neg Hx    Past Surgical History:  Procedure Laterality Date  . COLONOSCOPY  07/2009   2 adenomas rpt 5 yrs Collie Siad)  . COLONOSCOPY  05/2013   1 hyperplastic polyp, rpt 10 yrs Ardis Hughs)  . VASECTOMY  1995   Social History   Social History Narrative   Lives with wife   [place] time lives in [place] part time local   Occ: VP of sales   Edu: BS   Activity: gym 3x/wk    Diet: healthier recently - good water, fruits/vegetables daily   Immunization History  Administered Date(s) Administered  .  Influenza,inj,Quad PF,6+ Mos 09/19/2013, 02/25/2015, 10/25/2015, 12/21/2017  . Pneumococcal Polysaccharide-23 04/17/2014  . Tdap 04/25/2014     Objective: Vital Signs: There were no vitals taken for this visit.   Physical Exam   Musculoskeletal Exam: ***  CDAI Exam: CDAI Score: Not documented Patient Global Assessment: Not documented; Provider Global Assessment: Not documented Swollen: Not documented; Tender: Not documented Joint Exam   Not documented   There is currently no information documented on the homunculus. Go to the Rheumatology activity and complete the homunculus joint exam.  Investigation: No additional findings.  Imaging: No results found.  Recent Labs: Lab  Results  Component Value Date   WBC 6.7 03/30/2018   HGB 14.1 03/30/2018   PLT 256 03/30/2018   NA 141 03/30/2018   K 4.1 03/30/2018   CL 107 03/30/2018   CO2 26 03/30/2018   GLUCOSE 104 (H) 03/30/2018   BUN 19 03/30/2018   CREATININE 0.96 03/30/2018   BILITOT 0.5 03/30/2018   ALKPHOS 66 08/18/2017   AST 25 03/30/2018   ALT 24 03/30/2018   PROT 7.1 03/30/2018   ALBUMIN 4.8 08/18/2017   CALCIUM 9.6 03/30/2018   GFRAA 98 03/30/2018    Speciality Comments: No specialty comments available.  Procedures:  No procedures performed Allergies: Uloric [febuxostat]   Assessment / Plan:     Visit Diagnoses: No diagnosis found.   Orders: No orders of the defined types were placed in this encounter.  No orders of the defined types were placed in this encounter.   Face-to-face time spent with patient was *** minutes. Greater than 50% of time was spent in counseling and coordination of care.  Follow-Up Instructions: No follow-ups on file.   Ofilia Neas, PA-C  Note - This record has been created using Dragon software.  Chart creation errors have been sought, but may not always  have been located. Such creation errors do not reflect on  the standard of medical care.

## 2018-06-12 ENCOUNTER — Other Ambulatory Visit: Payer: Self-pay | Admitting: Family Medicine

## 2018-06-13 ENCOUNTER — Other Ambulatory Visit: Payer: Self-pay | Admitting: Family Medicine

## 2018-06-13 NOTE — Telephone Encounter (Signed)
Last office visit 12/21/2017 for Obesity.  Last refilled 05/17/2018 for #30 with no refills.  Note to pharmacy states needs office visit prior to more refills.  No future appointments.

## 2018-06-15 ENCOUNTER — Other Ambulatory Visit: Payer: Self-pay | Admitting: Family Medicine

## 2018-06-15 NOTE — Telephone Encounter (Signed)
Eprescribed.

## 2018-06-23 ENCOUNTER — Ambulatory Visit: Payer: Self-pay | Admitting: Physician Assistant

## 2018-07-06 ENCOUNTER — Other Ambulatory Visit: Payer: Self-pay | Admitting: Rheumatology

## 2018-07-06 NOTE — Telephone Encounter (Signed)
Last Visit:11/18/2017 Next Visit:07/27/18 Labs: 03/30/18 Uric acid within desirable range. CBC WNL.  Glucose is 104. Rest of CMP is WNL.  Okay to refill per Dr. Estanislado Pandy

## 2018-07-15 NOTE — Progress Notes (Deleted)
Office Visit Note  Patient: Robert Shea             Date of Birth: [birth year]           MRN: [id number]             PCP: Ria Bush, MD Referring: Ria Bush, MD Visit Date: 07/27/2018 Occupation: @GUAROCC @  Subjective:  No chief complaint on file.   History of Present Illness: Robert Shea is a 63 y.o. male ***   Activities of Daily Living:  Patient reports morning stiffness for *** {minute/hour:19697}.   Patient {ACTIONS;DENIES/REPORTS:21021675::"Denies"} nocturnal pain.  Difficulty dressing/grooming: {ACTIONS;DENIES/REPORTS:21021675::"Denies"} Difficulty climbing stairs: {ACTIONS;DENIES/REPORTS:21021675::"Denies"} Difficulty getting out of chair: {ACTIONS;DENIES/REPORTS:21021675::"Denies"} Difficulty using hands for taps, buttons, cutlery, and/or writing: {ACTIONS;DENIES/REPORTS:21021675::"Denies"}  No Rheumatology ROS completed.   PMFS History:  Patient Active Problem List   Diagnosis Date Noted  . Increased prostate specific antigen (PSA) velocity 12/21/2017  . Aortic arch atherosclerosis ([place]) 09/08/2017  . Emphysema of lung ([place]) 09/08/2017  . Travel advice encounter 08/12/2017  . Rheumatoid factor positive 10/06/2016  . Lesion of lip 12/25/2015  . High risk medication use 10/28/2015  . Osteoarthritis of both hands 10/28/2015  . Osteoarthritis of multiple joints 10/28/2015  . Milia 10/25/2015  . OSA on CPAP 10/25/2015  . Thoracic back pain 06/11/2015  . PMR (polymyalgia rheumatica) ([facility]) 01/30/2014  . Low back pain radiating to right lower extremity 01/01/2014  . Erectile dysfunction 09/19/2013  . Osteoarthritis of feet, bilateral 07/26/2013  . Hyperglycemia 03/25/2013  . Obesity, Class I, BMI 30-34.9   . Health care maintenance 03/15/2013  . Carotid stenosis 03/15/2013  . Gout   . HTN (hypertension)   . HLD (hyperlipidemia)   . Hypothyroidism   . History of colon polyps   . Ex-smoker     Past Medical History:  Diagnosis Date  .  Ex-smoker 2009   quit  . Gout   . Heart murmur longstanding  . History of chicken pox   . History of colon polyps 2010   benign  . History of measles   . HLD (hyperlipidemia)   . HTN (hypertension) 2010  . Hypothyroidism 2010  . Obesity   . PMR (polymyalgia rheumatica) ([place]) 01/30/2014    Family History  Problem Relation Age of Onset  . Cancer Father 84       bone?  . Cancer Maternal Uncle        throat, smoker  . Diabetes Mother   . Diabetes Brother   . CAD Paternal Grandfather 46       MI  . Stroke Paternal Grandfather   . CAD Brother 32       widowmaker  . Diabetes Brother   . Healthy Son   . Colon cancer Neg Hx   . Pancreatic cancer Neg Hx   . Stomach cancer Neg Hx   . Rectal cancer Neg Hx    Past Surgical History:  Procedure Laterality Date  . COLONOSCOPY  07/2009   2 adenomas rpt 5 yrs Collie Siad)  . COLONOSCOPY  05/2013   1 hyperplastic polyp, rpt 10 yrs Ardis Hughs)  . VASECTOMY  1995   Social History   Social History Narrative   Lives with wife   [place] time lives [place] part time local   Occ: VP of sales   Edu: BS   Activity: gym 3x/wk    Diet: healthier recently - good water, fruits/vegetables daily   Immunization History  Administered Date(s) Administered  .  Influenza,inj,Quad PF,6+ Mos 09/19/2013, 02/25/2015, 10/25/2015, 12/21/2017  . Pneumococcal Polysaccharide-23 04/17/2014  . Tdap 04/25/2014     Objective: Vital Signs: There were no vitals taken for this visit.   Physical Exam   Musculoskeletal Exam: ***  CDAI Exam: CDAI Score: - Patient Global: -; Provider Global: - Swollen: -; Tender: - Joint Exam   No joint exam has been documented for this visit   There is currently no information documented on the homunculus. Go to the Rheumatology activity and complete the homunculus joint exam.  Investigation: No additional findings.  Imaging: No results found.  Recent Labs: Lab Results  Component Value Date   WBC 6.7 03/30/2018    HGB 14.1 03/30/2018   PLT 256 03/30/2018   NA 141 03/30/2018   K 4.1 03/30/2018   CL 107 03/30/2018   CO2 26 03/30/2018   GLUCOSE 104 (H) 03/30/2018   BUN 19 03/30/2018   CREATININE 0.96 03/30/2018   BILITOT 0.5 03/30/2018   ALKPHOS 66 08/18/2017   AST 25 03/30/2018   ALT 24 03/30/2018   PROT 7.1 03/30/2018   ALBUMIN 4.8 08/18/2017   CALCIUM 9.6 03/30/2018   GFRAA 98 03/30/2018    Speciality Comments: No specialty comments available.  Procedures:  No procedures performed Allergies: Uloric [febuxostat]   Assessment / Plan:     Visit Diagnoses: No diagnosis found.  Orders: No orders of the defined types were placed in this encounter.  No orders of the defined types were placed in this encounter.   Face-to-face time spent with patient was *** minutes. Greater than 50% of time was spent in counseling and coordination of care.  Follow-Up Instructions: No follow-ups on file.   Ofilia Neas, PA-C  Note - This record has been created using Dragon software.  Chart creation errors have been sought, but may not always  have been located. Such creation errors do not reflect on  the standard of medical care.

## 2018-07-25 ENCOUNTER — Encounter: Payer: Self-pay | Admitting: Rheumatology

## 2018-07-27 ENCOUNTER — Ambulatory Visit: Payer: No Typology Code available for payment source | Admitting: Physician Assistant

## 2018-08-14 ENCOUNTER — Other Ambulatory Visit: Payer: Self-pay | Admitting: Family Medicine

## 2018-08-16 NOTE — Telephone Encounter (Signed)
Patient is overdue for follow up. Appointment made for 08/24/2018.

## 2018-08-24 ENCOUNTER — Ambulatory Visit (INDEPENDENT_AMBULATORY_CARE_PROVIDER_SITE_OTHER): Payer: No Typology Code available for payment source | Admitting: Family Medicine

## 2018-08-24 ENCOUNTER — Encounter: Payer: Self-pay | Admitting: Family Medicine

## 2018-08-24 VITALS — Ht 74.0 in

## 2018-08-24 DIAGNOSIS — I10 Essential (primary) hypertension: Secondary | ICD-10-CM

## 2018-08-24 NOTE — Progress Notes (Signed)
Pt asked to reschedule when Lattie Haw called at 12:30pm as he was driving and had no access to vital signs or meds.  Phentermine restarted 11/2017. Last seen 12/2017.

## 2018-09-08 ENCOUNTER — Telehealth: Payer: Self-pay | Admitting: *Deleted

## 2018-09-08 DIAGNOSIS — Z87891 Personal history of nicotine dependence: Secondary | ICD-10-CM

## 2018-09-08 DIAGNOSIS — Z122 Encounter for screening for malignant neoplasm of respiratory organs: Secondary | ICD-10-CM

## 2018-09-08 NOTE — Telephone Encounter (Signed)
Patient has been notified that annual lung cancer screening low dose CT scan is due currently or will be in near future. Confirmed that patient is within the age range of 55-77, and asymptomatic, (no signs or symptoms of lung cancer). Patient denies illness that would prevent curative treatment for lung cancer if found. Verified smoking history, (former, quit 01/06/07, 34 pack year). The shared decision making visit was done 09/02/17. Patient is agreeable for CT scan being scheduled.

## 2018-09-13 ENCOUNTER — Ambulatory Visit: Payer: No Typology Code available for payment source | Admitting: Family Medicine

## 2018-09-21 ENCOUNTER — Encounter: Payer: Self-pay | Admitting: Family Medicine

## 2018-09-22 ENCOUNTER — Other Ambulatory Visit: Payer: Self-pay | Admitting: Family Medicine

## 2018-09-22 ENCOUNTER — Ambulatory Visit: Admission: RE | Admit: 2018-09-22 | Payer: No Typology Code available for payment source | Source: Ambulatory Visit

## 2018-09-22 ENCOUNTER — Ambulatory Visit: Payer: No Typology Code available for payment source | Admitting: Family Medicine

## 2018-09-22 NOTE — Telephone Encounter (Signed)
Lvm for pt to call back and schedule CPE/labs

## 2018-09-22 NOTE — Telephone Encounter (Signed)
E-scribed refill.  Pls schedule CPE and labs. 

## 2018-09-26 NOTE — Telephone Encounter (Signed)
Great - thanks

## 2018-09-26 NOTE — Telephone Encounter (Signed)
We can schedule CPE/labs after 09/27/18 OV, thanks.

## 2018-09-26 NOTE — Telephone Encounter (Signed)
Pt called back and scheduled a follow up instead of cpe/labs on 09/27/18 @ 3:45p. We can schedule cpe/labs after this visit of do you want me to call him and try to change his appt?

## 2018-09-27 ENCOUNTER — Ambulatory Visit (INDEPENDENT_AMBULATORY_CARE_PROVIDER_SITE_OTHER): Payer: No Typology Code available for payment source | Admitting: Family Medicine

## 2018-09-27 ENCOUNTER — Other Ambulatory Visit: Payer: Self-pay

## 2018-09-27 ENCOUNTER — Encounter: Payer: Self-pay | Admitting: Family Medicine

## 2018-09-27 VITALS — BP 140/66 | HR 77 | Temp 97.9°F | Ht 74.0 in | Wt 273.3 lb

## 2018-09-27 DIAGNOSIS — I10 Essential (primary) hypertension: Secondary | ICD-10-CM | POA: Diagnosis not present

## 2018-09-27 DIAGNOSIS — R059 Cough, unspecified: Secondary | ICD-10-CM

## 2018-09-27 DIAGNOSIS — E669 Obesity, unspecified: Secondary | ICD-10-CM

## 2018-09-27 DIAGNOSIS — E782 Mixed hyperlipidemia: Secondary | ICD-10-CM

## 2018-09-27 DIAGNOSIS — Z23 Encounter for immunization: Secondary | ICD-10-CM

## 2018-09-27 DIAGNOSIS — M353 Polymyalgia rheumatica: Secondary | ICD-10-CM

## 2018-09-27 DIAGNOSIS — R05 Cough: Secondary | ICD-10-CM

## 2018-09-27 DIAGNOSIS — M1A09X0 Idiopathic chronic gout, multiple sites, without tophus (tophi): Secondary | ICD-10-CM

## 2018-09-27 NOTE — Patient Instructions (Addendum)
Flu shot today If interested, check with insurance about cost of new 2 shot shingles series (shingrix) and if cheaper at doctor's office or pharmacy.  Cough sounds more allergic - treat with daily OTC antihistamine like claritin or alegra as well as nasal steroid flonase.  Return for physical at your convenience and prior for fasting labs.

## 2018-09-27 NOTE — Assessment & Plan Note (Addendum)
Urate well controlled, continue allopurinol. No recent gout flare.

## 2018-09-27 NOTE — Progress Notes (Signed)
This visit was conducted in person.  BP 140/66 (BP Location: Left Arm, Patient Position: Sitting, Cuff Size: Large)   Pulse 77   Temp 97.9 F (36.6 C) (Temporal)   Ht 6\' 2"  (1.88 m)   Wt 273 lb 5 oz (124 kg)   SpO2 97%   BMI 35.09 kg/m    CC: f/u visit Subjective:    Patient ID: Robert Shea, male    DOB: Aug 06, 1955, 64 y.o.   MRN: [id number]  HPI: Robert Shea is a 63 y.o. male presenting on 09/27/2018 for Follow-up and Immunizations (Wants to discuss Shingrix.  Has been off methotrexate over 1 yr. )   Had to cancel last few office visits.   Cough - present for the past month. Only happens in the mornings, coughs up phlegm with benefit. No fever/chills, dyspnea, ST, reflux symptoms. He does have increased sneezing recently. No known Covid exposure. No nasal congestion or significant rhinitis.   Gout - no recent flare.  Lab Results  Component Value Date   LABURIC 4.8 03/30/2018   PMR - off methotrexate over a year. Overall stable period without body aches.   HLD - tolerating statin well without myalgias.   Shingrix - interested in this shot - will check with insurance  Obesity - weight gain over pandemic, has restarted regular exercise routine. Excited that gyms have reopened - planning to start regular exercise routine. Not interested in phentermine at this time.      Relevant past medical, surgical, family and social history reviewed and updated as indicated. Interim medical history since our last visit reviewed. Allergies and medications reviewed and updated. Outpatient Medications Prior to Visit  Medication Sig Dispense Refill  . allopurinol (ZYLOPRIM) 300 MG tablet TAKE 1 TABLET BY MOUTH EVERY DAY 90 tablet 0  . amLODipine (NORVASC) 10 MG tablet TAKE 1 TABLET BY MOUTH EVERY DAY 90 tablet 3  . Colchicine 0.6 MG CAPS TAKE 2 TABLETS BY MOUTH DAILY AS NEEDED 180 capsule 0  . levothyroxine (SYNTHROID) 100 MCG tablet TAKE 1 TABLET (100 MCG TOTAL) BY MOUTH DAILY  BEFORE BREAKFAST. 90 tablet 0  . lovastatin (MEVACOR) 40 MG tablet TAKE 1 TABLET BY MOUTH EVERYDAY AT BEDTIME 90 tablet 1  . sildenafil (VIAGRA) 100 MG tablet TAKE HALF TO 1 TABLET BY MOUTH EVERY DAYAS NEEDED 10 tablet 3   No facility-administered medications prior to visit.      Per HPI unless specifically indicated in ROS section below Review of Systems Objective:    BP 140/66 (BP Location: Left Arm, Patient Position: Sitting, Cuff Size: Large)   Pulse 77   Temp 97.9 F (36.6 C) (Temporal)   Ht 6\' 2"  (1.88 m)   Wt 273 lb 5 oz (124 kg)   SpO2 97%   BMI 35.09 kg/m   Wt Readings from Last 3 Encounters:  09/27/18 273 lb 5 oz (124 kg)  12/21/17 261 lb 8 oz (118.6 kg)  11/18/17 271 lb (122.9 kg)    Physical Exam Vitals signs and nursing note reviewed.  Constitutional:      General: He is not in acute distress.    Appearance: Normal appearance. He is not ill-appearing.  HENT:     Mouth/Throat:     Mouth: Mucous membranes are moist.     Pharynx: Oropharynx is clear. No posterior oropharyngeal erythema.  Eyes:     Extraocular Movements: Extraocular movements intact.     Conjunctiva/sclera: Conjunctivae normal.     Pupils: Pupils are  equal, round, and reactive to light.  Cardiovascular:     Rate and Rhythm: Normal rate and regular rhythm.     Pulses: Normal pulses.     Heart sounds: Normal heart sounds. No murmur.  Pulmonary:     Effort: Pulmonary effort is normal. No respiratory distress.     Breath sounds: Normal breath sounds. No wheezing, rhonchi or rales.  Musculoskeletal:     Right lower leg: No edema.     Left lower leg: No edema.  Neurological:     Mental Status: He is alert.  Psychiatric:        Mood and Affect: Mood normal.        Behavior: Behavior normal.       Results for orders placed or performed in visit on 03/30/18  Uric acid  Result Value Ref Range   Uric Acid, Serum 4.8 4.0 - 8.0 mg/dL  CBC with Differential/Platelet  Result Value Ref Range    WBC 6.7 3.8 - 10.8 Thousand/uL   RBC 4.35 4.20 - 5.80 Million/uL   Hemoglobin 14.1 13.2 - 17.1 g/dL   HCT 40.5 38.5 - 50.0 %   MCV 93.1 80.0 - 100.0 fL   MCH 32.4 27.0 - 33.0 pg   MCHC 34.8 32.0 - 36.0 g/dL   RDW 11.8 11.0 - 15.0 %   Platelets 256 140 - 400 Thousand/uL   MPV 10.0 7.5 - 12.5 fL   Neutro Abs 4,248 1,500 - 7,800 cells/uL   Lymphs Abs 1,608 850 - 3,900 cells/uL   Absolute Monocytes 556 200 - 950 cells/uL   Eosinophils Absolute 241 15 - 500 cells/uL   Basophils Absolute 47 0 - 200 cells/uL   Neutrophils Relative % 63.4 %   Total Lymphocyte 24.0 %   Monocytes Relative 8.3 %   Eosinophils Relative 3.6 %   Basophils Relative 0.7 %  COMPLETE METABOLIC PANEL WITH GFR  Result Value Ref Range   Glucose, Bld 104 (H) 65 - 99 mg/dL   BUN 19 7 - 25 mg/dL   Creat 0.96 0.70 - 1.25 mg/dL   GFR, Est Non African American 85 > OR = 60 mL/min/1.20m2   GFR, Est African American 98 > OR = 60 mL/min/1.39m2   BUN/Creatinine Ratio NOT APPLICABLE 6 - 22 (calc)   Sodium 141 135 - 146 mmol/L   Potassium 4.1 3.5 - 5.3 mmol/L   Chloride 107 98 - 110 mmol/L   CO2 26 20 - 32 mmol/L   Calcium 9.6 8.6 - 10.3 mg/dL   Total Protein 7.1 6.1 - 8.1 g/dL   Albumin 4.9 3.6 - 5.1 g/dL   Globulin 2.2 1.9 - 3.7 g/dL (calc)   AG Ratio 2.2 1.0 - 2.5 (calc)   Total Bilirubin 0.5 0.2 - 1.2 mg/dL   Alkaline phosphatase (APISO) 76 35 - 144 U/L   AST 25 10 - 35 U/L   ALT 24 9 - 46 U/L   Lab Results  Component Value Date   PSA 3.17 08/18/2017   PSA 1.79 05/25/2016   PSA 1.73 04/08/2015    Assessment & Plan:   Problem List Items Addressed This Visit    PMR (polymyalgia rheumatica) (HCC)    Stable period off MTX.       Obesity, Class I, BMI 30-34.9 - Primary    Encouraged healthy diet and lifestyle changes for sustainable weight loss. Pt motivated to restart exercise routine. Will try this route first, desires to avoid phentermine at this time.  HTN (hypertension)    Chronic, stable.  Continue amlodipine       HLD (hyperlipidemia)    Chronic, stable period on lovastatin - continue.       Gout    Urate well controlled, continue allopurinol. No recent gout flare.       Cough    Cough present for 1 month, only present in am. Anticipate allergy related - will trial flonase and antihistamine, update with effect.        Other Visit Diagnoses    Need for influenza vaccination       Relevant Orders   Flu Vaccine QUAD 36+ mos IM (Completed)       No orders of the defined types were placed in this encounter.  Orders Placed This Encounter  Procedures  . Flu Vaccine QUAD 36+ mos IM    Patient Instructions  Flu shot today If interested, check with insurance about cost of new 2 shot shingles series (shingrix) and if cheaper at doctor's office or pharmacy.  Cough sounds more allergic - treat with daily OTC antihistamine like claritin or alegra as well as nasal steroid flonase.  Return for physical at your convenience and prior for fasting labs.     Follow up plan: Return in about 2 months (around 11/27/2018) for annual exam, prior fasting for blood work.  Ria Bush, MD

## 2018-09-27 NOTE — Assessment & Plan Note (Signed)
Stable period off MTX.

## 2018-09-27 NOTE — Assessment & Plan Note (Signed)
Chronic, stable. Continue amlodipine.  

## 2018-09-27 NOTE — Assessment & Plan Note (Addendum)
Cough present for 1 month, only present in am. Anticipate allergy related - will trial flonase and antihistamine, update with effect.

## 2018-09-27 NOTE — Assessment & Plan Note (Signed)
Chronic, stable period on lovastatin - continue.

## 2018-09-27 NOTE — Assessment & Plan Note (Signed)
Encouraged healthy diet and lifestyle changes for sustainable weight loss. Pt motivated to restart exercise routine. Will try this route first, desires to avoid phentermine at this time.

## 2018-10-07 ENCOUNTER — Encounter: Payer: Self-pay | Admitting: *Deleted

## 2018-10-14 ENCOUNTER — Encounter: Payer: Self-pay | Admitting: Family Medicine

## 2018-10-16 MED ORDER — PHENTERMINE HCL 30 MG PO CAPS: 30.0000 mg | ORAL_CAPSULE | ORAL | 0 refills | Status: DC

## 2018-11-15 ENCOUNTER — Other Ambulatory Visit: Payer: Self-pay | Admitting: Rheumatology

## 2018-11-15 ENCOUNTER — Other Ambulatory Visit: Payer: Self-pay | Admitting: Family Medicine

## 2018-11-17 ENCOUNTER — Other Ambulatory Visit: Payer: Self-pay | Admitting: Family Medicine

## 2018-11-18 MED ORDER — PHENTERMINE HCL 30 MG PO CAPS: 30.0000 mg | ORAL_CAPSULE | ORAL | 0 refills | Status: DC

## 2018-11-18 NOTE — Telephone Encounter (Signed)
ERx 

## 2018-11-18 NOTE — Telephone Encounter (Signed)
Phentermine Last rx:  10/16/18, #30/0 Last OV:  09/27/18, f/u (wt, gout,HTN, etc) Next OV:  12/06/18, wt f/u

## 2018-11-29 ENCOUNTER — Other Ambulatory Visit: Payer: Self-pay

## 2018-11-29 ENCOUNTER — Telehealth: Payer: Self-pay | Admitting: Rheumatology

## 2018-11-29 ENCOUNTER — Other Ambulatory Visit: Payer: Self-pay | Admitting: Rheumatology

## 2018-11-29 DIAGNOSIS — Z79899 Other long term (current) drug therapy: Secondary | ICD-10-CM

## 2018-11-29 DIAGNOSIS — M1A09X0 Idiopathic chronic gout, multiple sites, without tophus (tophi): Secondary | ICD-10-CM

## 2018-11-29 MED ORDER — ALLOPURINOL 300 MG PO TABS: 300.0000 mg | ORAL_TABLET | Freq: Every day | ORAL | 0 refills | Status: DC

## 2018-11-29 NOTE — Telephone Encounter (Signed)
Ok to refill 30-day supply

## 2018-11-29 NOTE — Telephone Encounter (Signed)
Refill has been sent and lab orders have been faxed to Dr. Danise Mina.

## 2018-11-29 NOTE — Telephone Encounter (Signed)
Robert Shea called requesting prescription refill of Allopurinol to be sent to [facility] [address] in [facility].    Robert Shea is requesting the labwork that Dr. Estanislado Pandy needs be sent to his PCP Dr. Danise Mina.  Robert Shea states he has appointment scheduled for Tuesday, 12/06/18.  Robert Shea also scheduled a virtual appointment with Dr. Estanislado Pandy tomorrow 11/25 at 10:15 am.

## 2018-11-29 NOTE — Progress Notes (Signed)
Virtual Visit via Telephone Note  I connected with Robert Shea on 11/30/18 at 10:15 AM EST by telephone and verified that I am speaking with the correct person using two identifiers.  Location: Patient: Home  Provider: Clinic  This service was conducted via virtual visit.  The patient was located at home. I was located in my office.  Consent was obtained prior to the virtual visit and is aware of possible charges through their insurance for this visit.  The patient is an established patient.  Dr. Estanislado Pandy, MD conducted the virtual visit and Hazel Sams, PA-C acted as scribe during the service.  Office staff helped with scheduling follow up visits after the service was conducted.   I discussed the limitations, risks, security and privacy concerns of performing an evaluation and management service by telephone and the availability of in person appointments. I also discussed with the patient that there may be a patient responsible charge related to this service. The patient expressed understanding and agreed to proceed.  CC: Medication monitoring  History of Present Illness: Patient is a 15 male with a past medical history of PMR and gout. He discontinued MTX in April 2019 and has been off of prednisone for 3 years.  He denies any muscle tenderness, muscle aches, or muscle weakness.  He denies difficulty raising arms above his head or getting up from a chair.  He denies any joint pain or joint swelling at this time. He denies any recent gout flares.  He states he recently was traveling and did not take allopurinol for about 10 days and was eating seafood, so he took colchicine. He did not have a flare. He has resumed allopurinol 300 mg 1 tablet daily and colchicine 0.6 mg twice daily PRN.    Review of Systems  Constitutional: Negative for fever and malaise/fatigue.  Eyes: Negative for photophobia, pain, discharge and redness.  Respiratory: Negative for cough, shortness of breath and wheezing.    Cardiovascular: Negative for chest pain and palpitations.  Gastrointestinal: Positive for constipation. Negative for blood in stool and diarrhea.  Genitourinary: Negative for dysuria.  Musculoskeletal: Negative for back pain, joint pain, myalgias and neck pain.  Skin: Negative for rash.  Neurological: Negative for dizziness and headaches.  Psychiatric/Behavioral: Negative for depression. The patient is not nervous/anxious and does not have insomnia.       Observations/Objective: Physical Exam  Constitutional: He is oriented to person, place, and time.  Neurological: He is alert and oriented to person, place, and time.  Psychiatric: Mood, memory, affect and judgment normal.   Patient reports morning stiffness for 0  minutes.   Patient denies nocturnal pain.  Difficulty dressing/grooming: Denies Difficulty climbing stairs: Denies Difficulty getting out of chair: Denies Difficulty using hands for taps, buttons, cutlery, and/or writing: Denies   Assessment and Plan: Visit Diagnoses: PMR (polymyalgia rheumatica) ([place]): He discontinued MTX in April 2019 and has been off of prednisone for 3 years.  He has not had any signs or symptoms of flares.  He has no muscle aches, muscle tenderness, or muscle weakness.  He has no difficulty getting up from a chair or raising his arms above his head.  He is not having any joint pain, joint swelling, or morning stiffness.  He was advised to notify us if he develops signs or symptoms of a flare.  He will follow up in 6 months.  High risk medication use - He discontinued MTX and folic acid in April [id number].   Idiopathic chronic  gout of multiple sites without tophus -He has not had any recent gout flares.  He takes allopurinol 300 mg 1 tablet by mouth daily and Mitigare 0.6 mg twice daily as needed during a flare.  Uric acid was 4.8 on 03/30/18.  He continues to eat oysters on occasion.  We discussed the importance of avoiding purine rich foods.  He is due to  update lab work.  Future orders were placed today which include CBC, CMP and uric acid.He plans to get labs at his PCP's office next week.  A refill of allopurinol was sent to the pharmacy today.  He was advised to notify us if he develops symptoms of a gout flare.  We discussed the importance of regular exercise and weight loss.  Lateral epicondylitis of right elbow: Resolved   Primary osteoarthritis of both hands: He has occasional discomfort in both hands and mild morning stiffness.  He is not having any joint swelling.   Primary osteoarthritis of both hips: He has no discomfort at this time.   Primary osteoarthritis of both feet: He has occasional pain in both feet.  No joint swelling.   Rheumatoid factor positive: He is not having any joint pain or inflammation at this time.  He denies any morning stiffness recently.   Other medical conditions are listed as follows:   History of hypertension  History of hyperglycemia  History of hyperlipidemia  History of hypothyroidism   Follow Up Instructions: He will follow up in 6 months.    I discussed the assessment and treatment plan with the patient. The patient was provided an opportunity to ask questions and all were answered. The patient agreed with the plan and demonstrated an understanding of the instructions.   The patient was advised to call back or seek an in-person evaluation if the symptoms worsen or if the condition fails to improve as anticipated.  I provided 15 minutes of non-face-to-face time during this encounter.   Bo Merino, MD   Scribed by-  Hazel Sams PA-C

## 2018-11-29 NOTE — Telephone Encounter (Signed)
Last Visit: 11/18/2017 Next Visit: 11/30/2018 Labs: 03/30/2018 Uric acid within desirable range.   CBC WNL.  Glucose is 104. Rest of CMP is WNL.  Patient will get labs drawn at PCP on 12/06/2018   Okay to refill 30 day supply of allopurinol?

## 2018-11-30 ENCOUNTER — Other Ambulatory Visit: Payer: Self-pay

## 2018-11-30 ENCOUNTER — Encounter: Payer: Self-pay | Admitting: Rheumatology

## 2018-11-30 ENCOUNTER — Telehealth (INDEPENDENT_AMBULATORY_CARE_PROVIDER_SITE_OTHER): Payer: No Typology Code available for payment source | Admitting: Rheumatology

## 2018-11-30 DIAGNOSIS — Z8639 Personal history of other endocrine, nutritional and metabolic disease: Secondary | ICD-10-CM

## 2018-11-30 DIAGNOSIS — M19042 Primary osteoarthritis, left hand: Secondary | ICD-10-CM

## 2018-11-30 DIAGNOSIS — Z79899 Other long term (current) drug therapy: Secondary | ICD-10-CM

## 2018-11-30 DIAGNOSIS — M7711 Lateral epicondylitis, right elbow: Secondary | ICD-10-CM

## 2018-11-30 DIAGNOSIS — M160 Bilateral primary osteoarthritis of hip: Secondary | ICD-10-CM

## 2018-11-30 DIAGNOSIS — M1A09X0 Idiopathic chronic gout, multiple sites, without tophus (tophi): Secondary | ICD-10-CM | POA: Diagnosis not present

## 2018-11-30 DIAGNOSIS — R768 Other specified abnormal immunological findings in serum: Secondary | ICD-10-CM

## 2018-11-30 DIAGNOSIS — M19041 Primary osteoarthritis, right hand: Secondary | ICD-10-CM

## 2018-11-30 DIAGNOSIS — M353 Polymyalgia rheumatica: Secondary | ICD-10-CM

## 2018-11-30 DIAGNOSIS — M19071 Primary osteoarthritis, right ankle and foot: Secondary | ICD-10-CM

## 2018-11-30 DIAGNOSIS — Z8679 Personal history of other diseases of the circulatory system: Secondary | ICD-10-CM

## 2018-11-30 DIAGNOSIS — M19072 Primary osteoarthritis, left ankle and foot: Secondary | ICD-10-CM

## 2018-11-30 MED ORDER — ALLOPURINOL 300 MG PO TABS: 300.0000 mg | ORAL_TABLET | Freq: Every day | ORAL | 0 refills | Status: DC

## 2018-11-30 NOTE — Addendum Note (Signed)
Addended by: Earnestine Mealing on: 11/30/2018 03:34 PM   Modules accepted: Orders

## 2018-12-06 ENCOUNTER — Encounter: Payer: Self-pay | Admitting: Family Medicine

## 2018-12-06 ENCOUNTER — Other Ambulatory Visit: Payer: Self-pay | Admitting: *Deleted

## 2018-12-06 ENCOUNTER — Other Ambulatory Visit: Payer: Self-pay

## 2018-12-06 ENCOUNTER — Ambulatory Visit (INDEPENDENT_AMBULATORY_CARE_PROVIDER_SITE_OTHER): Payer: No Typology Code available for payment source | Admitting: Family Medicine

## 2018-12-06 VITALS — BP 134/68 | HR 65 | Temp 97.8°F | Ht 74.0 in | Wt 265.1 lb

## 2018-12-06 DIAGNOSIS — I10 Essential (primary) hypertension: Secondary | ICD-10-CM

## 2018-12-06 DIAGNOSIS — E669 Obesity, unspecified: Secondary | ICD-10-CM

## 2018-12-06 DIAGNOSIS — R011 Cardiac murmur, unspecified: Secondary | ICD-10-CM | POA: Insufficient documentation

## 2018-12-06 NOTE — Assessment & Plan Note (Signed)
Faint systolic, comes and goes, states present since young adult.

## 2018-12-06 NOTE — Patient Instructions (Addendum)
You are doing well today Let us know when we need refill of phentermine Schedule physical in 3 months.

## 2018-12-06 NOTE — Assessment & Plan Note (Signed)
Chronic, stable on amlodipine.

## 2018-12-06 NOTE — Progress Notes (Signed)
This visit was conducted in person.  BP 134/68 (BP Location: Left Arm, Patient Position: Sitting, Cuff Size: Large)   Pulse 65   Temp 97.8 F (36.6 C) (Temporal)   Ht 6\' 2"  (1.88 m)   Wt 265 lb 2 oz (120.3 kg)   SpO2 99%   BMI 34.04 kg/m    CC: 1 mo weight f/u visit Subjective:    Patient ID: Robert Shea, male    DOB: 02/11/1955, 63 y.o.   MRN: [id number]  HPI: Robert Shea is a 63 y.o. male presenting on 12/06/2018 for Follow-up (Here for wt f/u.)   Just drove in - 4.5 hrs. Planning to see family/friends next few days.   See prior notes for details.   Prior weight 273 lbs (09/27/2018) Starting weight - 275 lbs (per pt report - 10/14/2018)  Today's weight - 265 lbs   Current treatment regimen - phentermine 30mg  daily, regular exercise routine (walking, biking), limiting portion sizes.  Yesterday returned to gym - difficulty using mask while exercising.  Wants to try bike riding outdoors.   Tolerating med well, no HA, chest pain. Easier to control portions.   Previous trials - belviq caused dysthymia      Relevant past medical, surgical, family and social history reviewed and updated as indicated. Interim medical history since our last visit reviewed. Allergies and medications reviewed and updated. Outpatient Medications Prior to Visit  Medication Sig Dispense Refill  . allopurinol (ZYLOPRIM) 300 MG tablet Take 1 tablet (300 mg total) by mouth daily. 90 tablet 0  . amLODipine (NORVASC) 10 MG tablet TAKE 1 TABLET BY MOUTH EVERY DAY 90 tablet 3  . Colchicine 0.6 MG CAPS TAKE 2 TABLETS BY MOUTH DAILY AS NEEDED 180 capsule 0  . levothyroxine (SYNTHROID) 100 MCG tablet TAKE 1 TABLET (100 MCG TOTAL) BY MOUTH DAILY BEFORE BREAKFAST. 90 tablet 0  . lovastatin (MEVACOR) 40 MG tablet TAKE 1 TABLET BY MOUTH EVERYDAY AT BEDTIME 90 tablet 1  . phentermine 30 MG capsule Take 1 capsule (30 mg total) by mouth every morning. 30 capsule 0  . sildenafil (VIAGRA) 100 MG tablet TAKE  HALF TO 1 TABLET BY MOUTH EVERY DAYAS NEEDED 10 tablet 3   No facility-administered medications prior to visit.      Per HPI unless specifically indicated in ROS section below Review of Systems Objective:    BP 134/68 (BP Location: Left Arm, Patient Position: Sitting, Cuff Size: Large)   Pulse 65   Temp 97.8 F (36.6 C) (Temporal)   Ht 6\' 2"  (1.88 m)   Wt 265 lb 2 oz (120.3 kg)   SpO2 99%   BMI 34.04 kg/m   Wt Readings from Last 3 Encounters:  12/06/18 265 lb 2 oz (120.3 kg)  09/27/18 273 lb 5 oz (124 kg)  12/21/17 261 lb 8 oz (118.6 kg)    Physical Exam Vitals signs and nursing note reviewed.  Constitutional:      General: He is not in acute distress.    Appearance: Normal appearance. He is obese. He is not ill-appearing.  Eyes:     General: No scleral icterus.    Extraocular Movements: Extraocular movements intact.     Conjunctiva/sclera: Conjunctivae normal.     Pupils: Pupils are equal, round, and reactive to light.  Cardiovascular:     Rate and Rhythm: Normal rate and regular rhythm.     Pulses: Normal pulses.     Heart sounds: Murmur (2/6 faint systolic best at  RUSB) present.  Pulmonary:     Effort: Pulmonary effort is normal. No respiratory distress.     Breath sounds: Normal breath sounds. No wheezing, rhonchi or rales.  Musculoskeletal:     Right lower leg: No edema.     Left lower leg: No edema.  Neurological:     Mental Status: He is alert.  Psychiatric:        Mood and Affect: Mood normal.        Behavior: Behavior normal.       Results for orders placed or performed in visit on 03/30/18  Uric acid  Result Value Ref Range   Uric Acid, Serum 4.8 4.0 - 8.0 mg/dL  CBC with Differential/Platelet  Result Value Ref Range   WBC 6.7 3.8 - 10.8 Thousand/uL   RBC 4.35 4.20 - 5.80 Million/uL   Hemoglobin 14.1 13.2 - 17.1 g/dL   HCT 40.5 38.5 - 50.0 %   MCV 93.1 80.0 - 100.0 fL   MCH 32.4 27.0 - 33.0 pg   MCHC 34.8 32.0 - 36.0 g/dL   RDW 11.8 11.0 -  15.0 %   Platelets 256 140 - 400 Thousand/uL   MPV 10.0 7.5 - 12.5 fL   Neutro Abs 4,248 1,500 - 7,800 cells/uL   Lymphs Abs 1,608 850 - 3,900 cells/uL   Absolute Monocytes 556 200 - 950 cells/uL   Eosinophils Absolute 241 15 - 500 cells/uL   Basophils Absolute 47 0 - 200 cells/uL   Neutrophils Relative % 63.4 %   Total Lymphocyte 24.0 %   Monocytes Relative 8.3 %   Eosinophils Relative 3.6 %   Basophils Relative 0.7 %  COMPLETE METABOLIC PANEL WITH GFR  Result Value Ref Range   Glucose, Bld 104 (H) 65 - 99 mg/dL   BUN 19 7 - 25 mg/dL   Creat 0.96 0.70 - 1.25 mg/dL   GFR, Est Non African American 85 > OR = 60 mL/min/1.37m2   GFR, Est African American 98 > OR = 60 mL/min/1.32m2   BUN/Creatinine Ratio NOT APPLICABLE 6 - 22 (calc)   Sodium 141 135 - 146 mmol/L   Potassium 4.1 3.5 - 5.3 mmol/L   Chloride 107 98 - 110 mmol/L   CO2 26 20 - 32 mmol/L   Calcium 9.6 8.6 - 10.3 mg/dL   Total Protein 7.1 6.1 - 8.1 g/dL   Albumin 4.9 3.6 - 5.1 g/dL   Globulin 2.2 1.9 - 3.7 g/dL (calc)   AG Ratio 2.2 1.0 - 2.5 (calc)   Total Bilirubin 0.5 0.2 - 1.2 mg/dL   Alkaline phosphatase (APISO) 76 35 - 144 U/L   AST 25 10 - 35 U/L   ALT 24 9 - 46 U/L   Assessment & Plan:  This visit occurred during the SARS-CoV-2 public health emergency.  Safety protocols were in place, including screening questions prior to the visit, additional usage of staff PPE, and extensive cleaning of exam room while observing appropriate contact time as indicated for disinfecting solutions.   Problem List Items Addressed This Visit    Systolic murmur    Faint systolic, comes and goes, states present since young adult.       Obesity, Class I, BMI 30-34.9 - Primary    Stable period on phentermine, tolerating well, effective. Ok to continue. Will RTC 3 mo f/u visit given [place]. Update [place] sooner as needed.       HTN (hypertension)    Chronic, stable on amlodipine.  No orders of the  defined types were placed in this encounter.  No orders of the defined types were placed in this encounter.   Patient Instructions  You are doing well today Let us know when we need refill of phentermine Schedule physical in 3 months.    Follow up plan: Return in about 3 months (around 03/06/2019) for annual exam, prior fasting for blood work.  Ria Bush, MD

## 2018-12-06 NOTE — Assessment & Plan Note (Addendum)
Stable period on phentermine, tolerating well, effective. Ok to continue. Will RTC 3 mo f/u visit given [place]. Update [place] sooner as needed.

## 2018-12-13 ENCOUNTER — Other Ambulatory Visit: Payer: Self-pay | Admitting: Family Medicine

## 2018-12-13 ENCOUNTER — Encounter: Payer: Self-pay | Admitting: *Deleted

## 2018-12-13 NOTE — Telephone Encounter (Signed)
Phentermine Last filled:  11/18/18, #30 Last OV:  12/06/18, wt f/u Next OV:  none

## 2018-12-14 NOTE — Telephone Encounter (Signed)
Erx

## 2018-12-26 ENCOUNTER — Other Ambulatory Visit: Payer: Self-pay | Admitting: Family Medicine

## 2018-12-26 NOTE — Telephone Encounter (Signed)
Patient scheduled.

## 2018-12-26 NOTE — Telephone Encounter (Signed)
Noted.  E-scribed refill. 

## 2018-12-26 NOTE — Telephone Encounter (Signed)
Pls schedule annual CPE and lab visits.  Then return encounter to me to send refill.

## 2018-12-30 ENCOUNTER — Other Ambulatory Visit: Payer: Self-pay | Admitting: Family Medicine

## 2019-01-10 ENCOUNTER — Encounter: Payer: Self-pay | Admitting: Family Medicine

## 2019-01-10 NOTE — Telephone Encounter (Signed)
Thank you :)

## 2019-01-10 NOTE — Telephone Encounter (Signed)
Dr. Darnell Level I set patient up for tomorrow for check on this as requested by patient in notes above. Also advised patient that we could try and get him seen today and to call for availability if he preferred that.

## 2019-01-11 ENCOUNTER — Other Ambulatory Visit: Payer: Self-pay

## 2019-01-11 ENCOUNTER — Encounter: Payer: Self-pay | Admitting: Family Medicine

## 2019-01-11 ENCOUNTER — Ambulatory Visit (INDEPENDENT_AMBULATORY_CARE_PROVIDER_SITE_OTHER): Payer: No Typology Code available for payment source | Admitting: Family Medicine

## 2019-01-11 ENCOUNTER — Other Ambulatory Visit (INDEPENDENT_AMBULATORY_CARE_PROVIDER_SITE_OTHER): Payer: No Typology Code available for payment source

## 2019-01-11 ENCOUNTER — Other Ambulatory Visit: Payer: Self-pay | Admitting: Family Medicine

## 2019-01-11 DIAGNOSIS — R739 Hyperglycemia, unspecified: Secondary | ICD-10-CM

## 2019-01-11 DIAGNOSIS — R972 Elevated prostate specific antigen [PSA]: Secondary | ICD-10-CM

## 2019-01-11 DIAGNOSIS — M1A09X0 Idiopathic chronic gout, multiple sites, without tophus (tophi): Secondary | ICD-10-CM | POA: Diagnosis not present

## 2019-01-11 DIAGNOSIS — I10 Essential (primary) hypertension: Secondary | ICD-10-CM

## 2019-01-11 DIAGNOSIS — E782 Mixed hyperlipidemia: Secondary | ICD-10-CM

## 2019-01-11 DIAGNOSIS — S61412A Laceration without foreign body of left hand, initial encounter: Secondary | ICD-10-CM

## 2019-01-11 DIAGNOSIS — E039 Hypothyroidism, unspecified: Secondary | ICD-10-CM | POA: Diagnosis not present

## 2019-01-11 DIAGNOSIS — S61419A Laceration without foreign body of unspecified hand, initial encounter: Secondary | ICD-10-CM | POA: Insufficient documentation

## 2019-01-11 LAB — COMPREHENSIVE METABOLIC PANEL
ALT: 23 U/L (ref 0–53)
AST: 21 U/L (ref 0–37)
Albumin: 5 g/dL (ref 3.5–5.2)
Alkaline Phosphatase: 69 U/L (ref 39–117)
BUN: 19 mg/dL (ref 6–23)
CO2: 28 mEq/L (ref 19–32)
Calcium: 9.8 mg/dL (ref 8.4–10.5)
Chloride: 102 mEq/L (ref 96–112)
Creatinine, Ser: 1.08 mg/dL (ref 0.40–1.50)
GFR: 69.14 mL/min (ref >60.00)
Glucose, Bld: 134 mg/dL — ABNORMAL HIGH (ref 70–99)
Potassium: 4.5 mEq/L (ref 3.5–5.1)
Sodium: 138 mEq/L (ref 135–145)
Total Bilirubin: 0.7 mg/dL (ref 0.2–1.2)
Total Protein: 7.4 g/dL (ref 6.0–8.3)

## 2019-01-11 LAB — URIC ACID: Uric Acid, Serum: 6 mg/dL (ref 4.0–7.8)

## 2019-01-11 LAB — LIPID PANEL
Cholesterol: 218 mg/dL — ABNORMAL HIGH (ref 0–200)
HDL: 58.5 mg/dL (ref >39.00)
LDL Cholesterol: 137 mg/dL — ABNORMAL HIGH (ref 0–99)
NonHDL: 159.53
Total CHOL/HDL Ratio: 4
Triglycerides: 113 mg/dL (ref 0.0–149.0)
VLDL: 22.6 mg/dL (ref 0.0–40.0)

## 2019-01-11 LAB — TSH: TSH: 3.82 u[IU]/mL (ref 0.35–4.50)

## 2019-01-11 LAB — HEMOGLOBIN A1C: Hgb A1c MFr Bld: 5.7 % (ref 4.6–6.5)

## 2019-01-11 LAB — MICROALBUMIN / CREATININE URINE RATIO
Creatinine,U: 110.9 mg/dL
Microalb Creat Ratio: 0.9 mg/g (ref 0.0–30.0)
Microalb, Ur: 1 mg/dL (ref 0.0–1.9)

## 2019-01-11 LAB — PSA: PSA: 4.23 ng/mL — ABNORMAL HIGH (ref 0.10–4.00)

## 2019-01-11 MED ORDER — DOXYCYCLINE HYCLATE 100 MG PO TABS: 100.0000 mg | ORAL_TABLET | Freq: Two times a day (BID) | ORAL | 0 refills | Status: DC

## 2019-01-11 NOTE — Progress Notes (Signed)
This visit was conducted in person.  BP 140/72 (BP Location: Left Arm, Patient Position: Sitting, Cuff Size: Large)   Pulse 78   Temp 97.7 F (36.5 C) (Temporal)   Ht 6\' 2"  (1.88 m)   Wt 267 lb 11.2 oz (121.4 kg)   SpO2 99%   BMI 34.37 kg/m    CC: check hand wound Subjective:    Patient ID: Robert Shea, male    DOB: 12/13/1955, 64 y.o.   MRN: [id number]  HPI: Robert Shea is a 64 y.o. male presenting on 01/11/2019 for Suture / Staple Removal   DOI: 12/29/2018 Stabbed L thumb with oyster knife while shucking oysters.  Seen at ER, 3 stitches placed. Treated with doxy 5 d course Stitches came loose on their own, he removed them.  Increase in pain noted, but actually over the last few days symptoms have improved. No fevers/chills, nausea. Small amt pus 2d ago, none since. New scab developed yesterday to today.      Relevant past medical, surgical, family and social history reviewed and updated as indicated. Interim medical history since our last visit reviewed. Allergies and medications reviewed and updated. Outpatient Medications Prior to Visit  Medication Sig Dispense Refill  . allopurinol (ZYLOPRIM) 300 MG tablet Take 1 tablet (300 mg total) by mouth daily. 90 tablet 0  . amLODipine (NORVASC) 10 MG tablet TAKE 1 TABLET BY MOUTH EVERY DAY 90 tablet 3  . Colchicine 0.6 MG CAPS TAKE 2 TABLETS BY MOUTH DAILY AS NEEDED 180 capsule 0  . levothyroxine (SYNTHROID) 100 MCG tablet TAKE 1 TABLET (100 MCG TOTAL) BY MOUTH DAILY BEFORE BREAKFAST. 90 tablet 0  . lovastatin (MEVACOR) 40 MG tablet TAKE 1 TABLET BY MOUTH EVERYDAY AT BEDTIME 90 tablet 1  . phentermine 30 MG capsule TAKE 1 CAPSULE BY MOUTH EVERY DAY IN THE MORNING 30 capsule 0  . sildenafil (VIAGRA) 100 MG tablet TAKE HALF TO 1 TABLET BY MOUTH EVERY DAYAS NEEDED 10 tablet 3   No facility-administered medications prior to visit.     Per HPI unless specifically indicated in ROS section below Review of Systems Objective:     BP 140/72 (BP Location: Left Arm, Patient Position: Sitting, Cuff Size: Large)   Pulse 78   Temp 97.7 F (36.5 C) (Temporal)   Ht 6\' 2"  (1.88 m)   Wt 267 lb 11.2 oz (121.4 kg)   SpO2 99%   BMI 34.37 kg/m   Wt Readings from Last 3 Encounters:  01/11/19 267 lb 11.2 oz (121.4 kg)  12/06/18 265 lb 2 oz (120.3 kg)  09/27/18 273 lb 5 oz (124 kg)    Physical Exam Vitals and nursing note reviewed.  Constitutional:      Appearance: Normal appearance. He is not ill-appearing.  Musculoskeletal:        General: Tenderness present. Normal range of motion.     Left hand: Laceration present.       Hands:     Comments:  FROM at L thumb CMC, MCP joint without tenderness Tenderness to palpation at radial 1st MCP   Skin:    General: Skin is warm and dry.     Findings: Wound (~2cm puncture wound present L thenar eminence without significant surrounding erythema or drainage, scab present) present. No erythema or rash.  Neurological:     Mental Status: He is alert.       Assessment & Plan:  This visit occurred during the SARS-CoV-2 public health emergency.  Safety protocols  were in place, including screening questions prior to the visit, additional usage of staff PPE, and extensive cleaning of exam room while observing appropriate contact time as indicated for disinfecting solutions.   Problem List Items Addressed This Visit    Hand laceration    While oyster shucking.  Initially worsening, but fortunately over last 24 hours wound starting to heal/scab better, pain improving.  Had incomplete 5d doxy course - will extend another 5 days.  Return or seek care if streaking redness, draining pus, or fever develops.           Meds ordered this encounter  Medications  . doxycycline (VIBRA-TABS) 100 MG tablet    Sig: Take 1 tablet (100 mg total) by mouth 2 (two) times daily.    Dispense:  10 tablet    Refill:  0   No orders of the defined types were placed in this  encounter.   Patient instructions: Take another doxycycline course, treating possible vibrio infection with oyster history.   Follow up plan: No follow-ups on file.  Ria Bush, MD

## 2019-01-11 NOTE — Assessment & Plan Note (Signed)
While oyster shucking.  Initially worsening, but fortunately over last 24 hours wound starting to heal/scab better, pain improving.  Had incomplete 5d doxy course - will extend another 5 days.  Return or seek care if streaking redness, draining pus, or fever develops.

## 2019-01-11 NOTE — Patient Instructions (Addendum)
Take another doxycycline course, treating possible vibrio infection with oyster history.   Vibrio Vulnificus Infection Vibrio vulnificus are bacteria that normally live in warm seawater or in water that is a mixture of fresh and salt water (brackish water).Infection with these bacteria can cause digestive and skin problems. This infection can be very severe in people who have a weakened disease-fighting (immune) system (are immunocompromised) and in people with long-term (chronic) liver disease. In these people, the bacteria can invade the bloodstream and cause a life-threatening illness called septicemia. What are the causes? This condition is caused by:  Eating seafood that contains (is contaminated with) Vibrio vulnificus. This type of bacteria is often found in oysters and other shellfish in warm coastal waters during the summer.  Exposure of an open wound to seawater or brackish water that contains the bacteria. What increases the risk? The following factors may make you more likely to develop this condition:  Having liver disease, cancer, diabetes, thalassemia, or HIV (human immunodeficiency virus).  Taking medicine that reduces the activity of the immune system (immune-suppressing therapy).  Taking medicine that reduces stomach acid levels.  Having had stomach surgery recently.  Spending time in [place], [place], [place], [place], or [place].  Exposure to flood water after a tropical storm or hurricane around coastal areas. What are the signs or symptoms? Symptoms of this condition among otherwise healthy people include:  Vomiting.  Watery diarrhea.  Stomach pain.  Nausea.  Cramps in the abdomen. Symptoms among people who have another medical condition include:  Skin breakdown.  Skin blisters.  Open sores (ulcers) on the skin.  Fever.  Chills.  Low blood pressure. How is this diagnosed? This condition may be diagnosed by testing samples of stool, blood, or  wound tissue for bacteria (cultures). Your health care provider may suspect Vibrio vulnificus if you have been in salt water or brackish water and have watery diarrhea. How is this treated? This condition is treated with antibiotic medicines taken by mouth (orally). If septicemia develops, it will likely be treated with antibiotics given through an IV tube. Skin blisters or ulcers may be treated with a surgical procedure to remove dead or diseased tissue from wounds (debridement). Severe cases may require surgical removal (amputation) of the affected limb. Follow these instructions at home:  Take your antibiotic medicine as told by your health care provider. Do not stop taking the antibiotic even if you start to feel better.  Take over-the-counter and prescription medicines only as told by your health care provider.  Return to your normal activities as told by your health care provider. Ask your health care provider what activities are safe for you.  Drink enough fluid to keep your urine clear or pale yellow.  Keep all follow-up visits as told by your health care provider. This is important. How is this prevented?   Wear protective clothing, such as gloves, when handling raw shellfish.  Do not eat raw shellfish. Make sure that any shellfish you eat is thoroughly cooked: ? Boil or steam shellfish in a small pot. Using a large pot may cause uneven cooking. ? Do not eat shellfish that do not open after cooking.  Avoid contaminating cooked seafood and other kinds of food with raw seafood and raw seafood juices.  Always wash your hands with soap and water before and after handling seafood.  Avoid exposing open wounds or broken skin to warm seawater or brackish water.  When you are in brackish or salt water, wear shoes to protect  you from scrapes and cuts. Contact a health care provider if:  Your symptoms seem to be getting worse.  You develop new symptoms. Get help right away if:  You  cannot drink fluids without vomiting.  You are immunocompromised or have chronic liver disease and: ? You have symptoms of low blood pressure, such as dizziness, fainting, lack of concentration, blurred vision, rapid and shallow breathing, or cold skin. ? You develop a fever or chills. ? You have an increased heart rate. ? You have shortness of breath or difficulty breathing. ? You are light-headed, you feel restless, or you feel confused. ? You have sores or blisters on your skin. Summary  Vibrio vulnificus are bacteria that normally live in warm seawater or in water that is a mixture of fresh and salt water (brackish water).  This infection can be very severe in people who have a weakened disease-fighting system (are immunocompromised) and in people with long-term (chronic) liver disease.  Your health care provider may suspect Vibrio vulnificus if you have been in salt water or brackish water and have watery diarrhea.  Do not eat raw shellfish. Make sure that any shellfish you eat is thoroughly cooked. This information is not intended to replace advice given to you by your health care provider. Make sure you discuss any questions you have with your health care provider. Document Revised: 04/12/2018 Document Reviewed: 11/16/2015 Elsevier Patient Education  [place].

## 2019-01-13 ENCOUNTER — Encounter: Payer: Self-pay | Admitting: Family Medicine

## 2019-01-13 DIAGNOSIS — R972 Elevated prostate specific antigen [PSA]: Secondary | ICD-10-CM

## 2019-01-20 ENCOUNTER — Other Ambulatory Visit: Payer: Self-pay | Admitting: Family Medicine

## 2019-01-20 ENCOUNTER — Other Ambulatory Visit: Payer: Self-pay | Admitting: Rheumatology

## 2019-01-20 NOTE — Telephone Encounter (Signed)
Name of Medication: Phentermine Name of Pharmacy: [facility], [facility] or Written Date and Quantity: 12/16/18, #30 Last Office Visit and Type: 01/11/19, acute hand injury Next Office Visit and Type: 02/27/19, CPE Last Controlled Substance Agreement Date: none Last UDS: none

## 2019-01-20 NOTE — Telephone Encounter (Signed)
ERx 

## 2019-01-20 NOTE — Telephone Encounter (Signed)
Last Visit: 11/30/18  Next Visit: 06/14/19 Labs: 01/11/19   Okay to refill per Dr. Estanislado Pandy

## 2019-02-15 ENCOUNTER — Ambulatory Visit: Payer: Self-pay | Admitting: Urology

## 2019-02-21 ENCOUNTER — Encounter: Payer: Self-pay | Admitting: Urology

## 2019-02-21 ENCOUNTER — Ambulatory Visit (INDEPENDENT_AMBULATORY_CARE_PROVIDER_SITE_OTHER): Payer: No Typology Code available for payment source | Admitting: Urology

## 2019-02-21 ENCOUNTER — Other Ambulatory Visit: Payer: Self-pay

## 2019-02-21 VITALS — BP 170/75 | HR 85 | Ht 75.0 in | Wt 255.0 lb

## 2019-02-21 DIAGNOSIS — N138 Other obstructive and reflux uropathy: Secondary | ICD-10-CM | POA: Diagnosis not present

## 2019-02-21 DIAGNOSIS — N401 Enlarged prostate with lower urinary tract symptoms: Secondary | ICD-10-CM

## 2019-02-21 DIAGNOSIS — R972 Elevated prostate specific antigen [PSA]: Secondary | ICD-10-CM

## 2019-02-21 NOTE — Progress Notes (Signed)
02/21/2019 3:49 PM   [name] [birth year] [id number]  Referring provider: Ria Bush, MD [address] [place],  [place] [address]  Chief Complaint  Patient presents with  . Elevated PSA    HPI: 64 year old male who presents today for further evaluation of elevated PSA.  Most recently, his PSA is 4.23 on 01/11/2019.  This is up significantly from his previous value of 3.0 in 12/2017 and 1.79 on 05/2016.  No family history of prostate cancer.  No weight loss or bone pain.  He does report that over the past several months, he has had decreased force of his urinary stream.  He is also not getting up at night once or twice.  He denies any dysuria, urgency or frequency other than nocturia as above.  No pelvic pain or history of prostatitis.  No recent UTIs.   PMH: Past Medical History:  Diagnosis Date  . Ex-smoker 2009   quit  . Gout   . Heart murmur longstanding  . History of chicken pox   . History of colon polyps 2010   benign  . History of measles   . HLD (hyperlipidemia)   . HTN (hypertension) 2010  . Hypothyroidism 2010  . Obesity   . PMR (polymyalgia rheumatica) ([place]) 01/30/2014    Surgical History: Past Surgical History:  Procedure Laterality Date  . COLONOSCOPY  07/2009   2 adenomas rpt 5 yrs Collie Siad)  . COLONOSCOPY  05/2013   1 hyperplastic polyp, rpt 10 yrs Ardis Hughs)  . VASECTOMY  1995    Home Medications:  Allergies as of 02/21/2019      Reactions   Uloric [febuxostat]       Medication List       Accurate as of February 21, 2019  3:49 PM. If you have any questions, ask your nurse or doctor.        STOP taking these medications   doxycycline 100 MG tablet Commonly known as: VIBRA-TABS Stopped by: Hollice Espy, MD     TAKE these medications   allopurinol 300 MG tablet Commonly known as: ZYLOPRIM TAKE 1 TABLET BY MOUTH EVERY DAY   amLODipine 10 MG tablet Commonly known as: NORVASC TAKE 1 TABLET BY MOUTH EVERY DAY    Colchicine 0.6 MG Caps TAKE 2 TABLETS BY MOUTH DAILY AS NEEDED   levothyroxine 100 MCG tablet Commonly known as: SYNTHROID TAKE 1 TABLET (100 MCG TOTAL) BY MOUTH DAILY BEFORE BREAKFAST.   lovastatin 40 MG tablet Commonly known as: MEVACOR TAKE 1 TABLET BY MOUTH EVERYDAY AT BEDTIME   phentermine 30 MG capsule TAKE 1 CAPSULE BY MOUTH EVERY DAY IN THE MORNING   sildenafil 100 MG tablet Commonly known as: VIAGRA TAKE HALF TO 1 TABLET BY MOUTH EVERY DAYAS NEEDED       Allergies:  Allergies  Allergen Reactions  . Uloric [Febuxostat]     Family History: Family History  Problem Relation Age of Onset  . Cancer Father 73       bone?  . Cancer Maternal Uncle        throat, smoker  . Diabetes Mother   . Diabetes Brother   . CAD Paternal Grandfather 63       MI  . Stroke Paternal Grandfather   . CAD Brother 33       widowmaker  . Diabetes Brother   . Healthy Son   . Colon cancer Neg Hx   . Pancreatic cancer Neg Hx   . Stomach cancer  Neg Hx   . Rectal cancer Neg Hx     Social History:  reports that he quit smoking about 12 years ago. He has a 34.00 pack-year smoking history. He has never used smokeless tobacco. He reports current alcohol use. He reports that he does not use drugs.   Physical Exam: BP (!) 170/75   Pulse 85   Ht 6\' 3"  (1.905 m)   Wt 255 lb (115.7 kg)   BMI 31.87 kg/m   Constitutional:  Alert and oriented, No acute distress. HEENT: [place] AT, moist mucus membranes.  Trachea midline, no masses. Cardiovascular: No clubbing, cyanosis, or edema. Respiratory: Normal respiratory effort, no increased work of breathing. Rectal: Normal sphincter tone.  45 cc prostate, nontender, no nodules Neurologic: Grossly intact, no focal deficits, moving all 4 extremities. Psychiatric: Normal mood and affect.  Laboratory Data: Lab Results  Component Value Date   WBC 6.7 03/30/2018   HGB 14.1 03/30/2018   HCT 40.5 03/30/2018   MCV 93.1 03/30/2018   PLT 256 03/30/2018     Lab Results  Component Value Date   CREATININE 1.08 01/11/2019    Lab Results  Component Value Date   PSA 4.23 (H) 01/11/2019   PSA 3.17 08/18/2017   PSA 1.79 05/25/2016    Lab Results  Component Value Date   HGBA1C 5.7 01/11/2019    Assessment & Plan:    1. Elevated prostate specific antigen (PSA)  We reviewed the implications of an elevated PSA and the uncertainty surrounding it. In general, a man's PSA increases with age and is produced by both normal and cancerous prostate tissue. Differential for elevated PSA is BPH, prostate cancer, infection, recent intercourse/ejaculation, prostate infarction, recent urethroscopic manipulation (foley placement/cystoscopy) and prostatitis. Management of an elevated PSA can include observation or prostate biopsy and wediscussed this in detail. We discussed that indications for prostate biopsy are defined by age and race specific PSA cutoffs as well as a PSA velocity of 0.75/year.  DRE today reassuring  Plan for repeat PSA today.  If remains elevated, recommend prostate biospy.  We discussed prostate biopsy in detail including the procedure itself, the risks of blood in the urine, stool, and ejaculate, serious infection, and discomfort. He is willing to proceed with this as discussed.  - PSA  2. BPH with obstruction/lower urinary tract symptoms Increased weak stream and nocturia x 2 Discussed pharmacotherapy vs surgery, not interested in intervention at this time  Will call with PSA results  Hollice Espy, MD  [facility] [address] [place], [place] [address] [phone]

## 2019-02-22 ENCOUNTER — Encounter: Payer: Self-pay | Admitting: Family Medicine

## 2019-02-22 ENCOUNTER — Telehealth: Payer: Self-pay | Admitting: *Deleted

## 2019-02-22 DIAGNOSIS — R972 Elevated prostate specific antigen [PSA]: Secondary | ICD-10-CM

## 2019-02-22 LAB — PSA: Prostate Specific Ag, Serum: 3.3 ng/mL (ref 0.0–4.0)

## 2019-02-22 NOTE — Telephone Encounter (Addendum)
Left VM asked to return call.   ----- Message from Hollice Espy, MD sent at 02/22/2019  8:33 AM EST ----- PSA is down a good bit to 3.3.  This is very reassuring.  I would like him to repeat his PSA screening with [place] next year with a PSA prior.  He can see Inocente Salles or Larene Beach for this.  We can keep a little bit of a closer eye on him and if things remain stable, he can follow-up with his primary thereafter.  Hollice Espy, MD

## 2019-02-22 NOTE — Telephone Encounter (Signed)
Patient notified and scheduled 

## 2019-02-22 NOTE — Telephone Encounter (Signed)
Spoke with pt to get more details of sxs.  He repeated info in original message.  Pain seems to be a constant dull pain.  Mentioned pain in back seems to occur when he is looking down at computer/tablet for long periods of time. Started on 02/17/19 after returning from vacation on the plane ride.  Denies any SOB, profuse sweating.  Advised pt to seek emergent care or at least urgent care.  Pt wants advise from Dr. Darnell Level first.  Says he has CPE on 02/27/19 and can wait until then unless Dr. Darnell Level says different.  Pls advise.

## 2019-02-22 NOTE — Addendum Note (Signed)
Addended by: Tommy Rainwater on: 02/22/2019 03:32 PM   Modules accepted: Orders

## 2019-02-22 NOTE — Telephone Encounter (Signed)
Spoke with patient.  Constant dull pain that started after recent trip to [place] - flew down. Pain started left back near shoulder blade, progressively worsening, improved with heating pad. Pain progressing down tricep to pinky. Today had some sharp chest discomfort that lasted 2 minutes during stressful phone call. No pleurisy. No exertional substernal pressure pain.  Sitting down and leaning forward worsens pain. No dyspnea with this.  [facility] without pain - actually felt better with this.  Discussed likely not cardiac in nature, not consistent with PE. Red flags to seek care reviewed. O/w will eval on Monday

## 2019-02-27 ENCOUNTER — Ambulatory Visit (INDEPENDENT_AMBULATORY_CARE_PROVIDER_SITE_OTHER): Payer: No Typology Code available for payment source | Admitting: Family Medicine

## 2019-02-27 ENCOUNTER — Encounter: Payer: Self-pay | Admitting: Family Medicine

## 2019-02-27 ENCOUNTER — Other Ambulatory Visit: Payer: Self-pay

## 2019-02-27 VITALS — BP 138/62 | HR 72 | Temp 97.7°F | Ht 74.0 in | Wt 270.5 lb

## 2019-02-27 DIAGNOSIS — Z0000 Encounter for general adult medical examination without abnormal findings: Secondary | ICD-10-CM

## 2019-02-27 DIAGNOSIS — M1A09X0 Idiopathic chronic gout, multiple sites, without tophus (tophi): Secondary | ICD-10-CM

## 2019-02-27 DIAGNOSIS — R7303 Prediabetes: Secondary | ICD-10-CM

## 2019-02-27 DIAGNOSIS — R011 Cardiac murmur, unspecified: Secondary | ICD-10-CM

## 2019-02-27 DIAGNOSIS — I10 Essential (primary) hypertension: Secondary | ICD-10-CM | POA: Diagnosis not present

## 2019-02-27 DIAGNOSIS — I6523 Occlusion and stenosis of bilateral carotid arteries: Secondary | ICD-10-CM

## 2019-02-27 DIAGNOSIS — Z87891 Personal history of nicotine dependence: Secondary | ICD-10-CM

## 2019-02-27 DIAGNOSIS — E782 Mixed hyperlipidemia: Secondary | ICD-10-CM

## 2019-02-27 DIAGNOSIS — M353 Polymyalgia rheumatica: Secondary | ICD-10-CM

## 2019-02-27 DIAGNOSIS — R972 Elevated prostate specific antigen [PSA]: Secondary | ICD-10-CM

## 2019-02-27 DIAGNOSIS — E039 Hypothyroidism, unspecified: Secondary | ICD-10-CM

## 2019-02-27 DIAGNOSIS — E669 Obesity, unspecified: Secondary | ICD-10-CM

## 2019-02-27 NOTE — Assessment & Plan Note (Signed)
Due for rpt lung cancer screening CT - will call insurance to find out about coverage

## 2019-02-27 NOTE — Progress Notes (Signed)
This visit was conducted in person.  BP 138/62 (BP Location: Left Arm, Patient Position: Sitting, Cuff Size: Large)   Pulse 72   Temp 97.7 F (36.5 C) (Temporal)   Ht 6\' 2"  (1.88 m)   Wt 270 lb 8 oz (122.7 kg)   SpO2 99%   BMI 34.73 kg/m    CC: CPE Subjective:    Patient ID: Robert Shea, male    DOB: 05-21-1955, 64 y.o.   MRN: [id number]  HPI: Robert Shea is a 64 y.o. male presenting on 02/27/2019 for Annual Exam   Lives in [place], stays locally as well.  See recent mychart message for details - recent dull chest pain after [place] trip. This is getting better, more noticeable when sitting and leaning forward. 6 lb weight gain. Dietary liberties - no gout flare despite this (allopurinol working well).   Recent hand laceration while oyster shucking. Treated with doxycycline   Osteoarthritis, gout, PMR - sees rheum (Deveshwar) on allopurinol and PRN mitigare.   Preventative: COLONOSCOPY Date: 05/2013 1 hyperplastic polyp, rpt 10 yrs Ardis Hughs) Prostate cancer screening - sees urology - recent PSA stable at 3.3 Lung cancer screening - initially 08/2017 - due for repeat  Flu - yearly  Tdap - 04/2014, 12/2018 Pneumovax 2016 Shingrix - discussed  Seat belt use discussed  Sunscreen use discussed. No changing moles on skin. Sees derm yearly.Recent back skin biopsy late spring - suture never dissolved, still bothering him.  Ex smoker - quit 2009. Prior 1 ppd for 30 yrs.  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 Interim medical history since our last visit  reviewed. Allergies and medications reviewed and updated. Outpatient Medications Prior to Visit  Medication Sig Dispense Refill  . allopurinol (ZYLOPRIM) 300 MG tablet TAKE 1 TABLET BY MOUTH EVERY DAY 90 tablet 0  . amLODipine (NORVASC) 10 MG tablet TAKE 1 TABLET BY MOUTH EVERY DAY 90 tablet 3  . Colchicine 0.6 MG CAPS TAKE 2 TABLETS BY MOUTH DAILY AS NEEDED 180 capsule 0  . levothyroxine (SYNTHROID) 100 MCG tablet TAKE 1 TABLET (100 MCG TOTAL) BY MOUTH DAILY BEFORE BREAKFAST. 90 tablet 0  . lovastatin (MEVACOR) 40 MG tablet TAKE 1 TABLET BY MOUTH EVERYDAY AT BEDTIME 90 tablet 1  . phentermine 30 MG capsule TAKE 1 CAPSULE BY MOUTH EVERY DAY IN THE MORNING 30 capsule 0  . sildenafil (VIAGRA) 100 MG tablet TAKE HALF TO 1 TABLET BY MOUTH EVERY DAYAS NEEDED 10 tablet 3   No facility-administered medications prior to visit.     Per HPI unless specifically indicated in ROS section below Review of Systems  Constitutional: Negative for activity change, appetite change, chills, fatigue, fever and unexpected weight change.  HENT: Negative for hearing loss.   Eyes: Negative for visual disturbance.  Respiratory: Negative for cough, chest tightness, shortness of breath and wheezing.   Cardiovascular: Negative for chest pain, palpitations and leg swelling.  Gastrointestinal: Negative for abdominal distention, abdominal pain, blood in stool, constipation, diarrhea, nausea and vomiting.  Genitourinary: Negative for difficulty urinating  and hematuria.  Musculoskeletal: Negative for arthralgias, myalgias and neck pain.  Skin: Negative for rash.  Neurological: Positive for headaches (pressure - managed with claritin). Negative for dizziness, seizures and syncope.  Hematological: Negative for adenopathy. Does not bruise/bleed easily.  Psychiatric/Behavioral: Negative for dysphoric mood. The patient is not nervous/anxious.    Objective:    BP 138/62 (BP Location: Left Arm, Patient Position: Sitting, Cuff  Size: Large)   Pulse 72   Temp 97.7 F (36.5 C) (Temporal)   Ht 6\' 2"  (1.88 m)   Wt 270 lb 8 oz (122.7 kg)   SpO2 99%   BMI 34.73 kg/m   Wt Readings from Last 3 Encounters:  02/27/19 270 lb 8 oz (122.7 kg)  02/21/19 255 lb (115.7 kg)  01/11/19 267 lb 11.2 oz (121.4 kg)    Physical Exam Vitals and nursing note reviewed.  Constitutional:      General: He is not in acute distress.    Appearance: Normal appearance. He is well-developed. He is not ill-appearing.  HENT:     Head: Normocephalic and atraumatic.     Right Ear: Hearing, tympanic membrane, ear canal and external ear normal.     Left Ear: Hearing, tympanic membrane, ear canal and external ear normal.     Mouth/Throat:     Pharynx: Uvula midline.  Eyes:     General: No scleral icterus.    Extraocular Movements: Extraocular movements intact.     Conjunctiva/sclera: Conjunctivae normal.     Pupils: Pupils are equal, round, and reactive to light.  Neck:     Thyroid: No thyromegaly or thyroid tenderness.     Vascular: Carotid bruit (L>R) present.  Cardiovascular:     Rate and Rhythm: Normal rate and regular rhythm.     Pulses: Normal pulses.          Radial pulses are 2+ on the right side and 2+ on the left side.     Heart sounds: Murmur (3/6 systolic best at USB) present.  Pulmonary:     Effort: Pulmonary effort is normal. No respiratory distress.     Breath sounds: Normal breath sounds. No wheezing, rhonchi or rales.  Abdominal:     General: Abdomen is flat. Bowel sounds are normal. There is no distension.     Palpations: Abdomen is soft. There is no mass.     Tenderness: There is no abdominal tenderness. There is no guarding or rebound.     Hernia: No hernia is present.  Musculoskeletal:        General: Normal range of motion.     Cervical back: Normal range of motion and neck supple.     Right lower leg: No edema.     Left lower leg: No edema.  Lymphadenopathy:     Cervical: No cervical adenopathy.  Skin:     General: Skin is warm and dry.     Findings: No rash.  Neurological:     General: No focal deficit present.     Mental Status: He is alert and oriented to person, place, and time.     Comments: CN grossly intact, station and gait intact  Psychiatric:        Mood and Affect: Mood normal.        Behavior: Behavior normal.        Thought Content: Thought content normal.        Judgment: Judgment normal.       Results for orders placed or performed in visit  on 02/21/19  PSA  Result Value Ref Range   Prostate Specific Ag, Serum 3.3 0.0 - 4.0 ng/mL   Assessment & Plan:  This visit occurred during the SARS-CoV-2 public health emergency.  Safety protocols were in place, including screening questions prior to the visit, additional usage of staff PPE, and extensive cleaning of exam room while observing appropriate contact time as indicated for disinfecting solutions.   Problem List Items Addressed This Visit    Systolic murmur    Increasing intensity of systolic murmur suspicious from previous measure (4-6 months ago). Will get baseline echo.       Relevant Orders   ECHOCARDIOGRAM COMPLETE   Prediabetes    Encouraged watching added sugars, working on weight loss.      PMR (polymyalgia rheumatica) (HCC)    Stable period off MTX and prednisone. Has seen rheum      Obesity, Class I, BMI 30-34.9    Encouraged ongoing efforts at sustainable diet and lifestyle changes  Weight gain noted after recent vacation to the [place].       Increased prostate specific antigen (PSA) velocity    Appreciate uro care - latest check with improved PSA - planned close monitoring.       Hypothyroidism    Chronic, stable on current levothyroxine dose - continue.       HTN (hypertension)    Chronic, stable. Continue amlodipine.       HLD (hyperlipidemia)    Chronic, deteriorated despite lovastatin - he did miss several days recently. Consider rpt FLP in 6 months and then reassess statin.  The 10-year  ASCVD risk score [name] DC Brooke Bonito., et al., 2013) is: 12.9%   Values used to calculate the score:     Age: 32 years     Sex: Male     Is Non-Hispanic African American: No     Diabetic: No     Tobacco smoker: No     Systolic Blood Pressure: 0000000 mmHg     Is BP treated: Yes     HDL Cholesterol: 58.5 mg/dL     Total Cholesterol: 218 mg/dL       Health care maintenance - Primary    Preventative protocols reviewed and updated unless pt declined. Discussed healthy diet and lifestyle.       Gout    Stable period on allopurinol without recent gout flare.       Ex-smoker    Due for rpt lung cancer screening CT - will call insurance to find out about coverage      Carotid stenosis    Increasing strength of L>R bruits heard today - will update carotid US      Relevant Orders   VAS US CAROTID       No orders of the defined types were placed in this encounter.  Orders Placed This Encounter  Procedures  . ECHOCARDIOGRAM COMPLETE    Standing Status:   Future    Standing Expiration Date:   05/26/2020    Order Specific Question:   Where should this test be performed    Answer:   [facility]    Order Specific Question:   Perflutren DEFINITY (image enhancing agent) should be administered unless hypersensitivity or allergy exist    Answer:   Administer Perflutren    Order Specific Question:   Is a special reader required? (athlete or structural heart)    Answer:   No    Order Specific Question:   Reason for exam-Echo  Answer:   Murmur  785.2 / R01.1    Patient instructions: Check with insurance on cost for lung cancer screening CT.  Let's update neck artery ultrasound as well as check baseline heart ultrasound for murmur heard.  You are doing well today. Watch added sugars and simple carbs in diet.  Return as needed or in 6 months for follow up visit.   Follow up plan: Return in about 6 months (around 08/27/2019) for follow up visit.  Ria Bush, MD

## 2019-02-27 NOTE — Assessment & Plan Note (Signed)
Stable period on allopurinol without recent gout flare.

## 2019-02-27 NOTE — Assessment & Plan Note (Addendum)
Increasing strength of L>R bruits heard today - will update carotid US

## 2019-02-27 NOTE — Assessment & Plan Note (Signed)
Encouraged watching added sugars, working on weight loss.

## 2019-02-27 NOTE — Assessment & Plan Note (Signed)
Chronic, stable. Continue amlodipine.  

## 2019-02-27 NOTE — Assessment & Plan Note (Signed)
Appreciate uro care - latest check with improved PSA - planned close monitoring.

## 2019-02-27 NOTE — Assessment & Plan Note (Signed)
Stable period off MTX and prednisone. Has seen rheum

## 2019-02-27 NOTE — Assessment & Plan Note (Signed)
Increasing intensity of systolic murmur suspicious from previous measure (4-6 months ago). Will get baseline echo.

## 2019-02-27 NOTE — Assessment & Plan Note (Signed)
Preventative protocols reviewed and updated unless pt declined. Discussed healthy diet and lifestyle.  

## 2019-02-27 NOTE — Patient Instructions (Addendum)
Check with insurance on cost for lung cancer screening CT.  Let's update neck artery ultrasound as well as check baseline heart ultrasound for murmur heard.  You are doing well today. Watch added sugars and simple carbs in diet.  Return as needed or in 6 months for follow up visit.   Health Maintenance, Male Adopting a healthy lifestyle and getting preventive care are important in promoting health and wellness. Ask your health care provider about:  The right schedule for you to have regular tests and exams.  Things you can do on your own to prevent diseases and keep yourself healthy. What should I know about diet, weight, and exercise? Eat a healthy diet   Eat a diet that includes plenty of vegetables, fruits, low-fat dairy products, and lean protein.  Do not eat a lot of foods that are high in solid fats, added sugars, or sodium. Maintain a healthy weight Body mass index (BMI) is a measurement that can be used to identify possible weight problems. It estimates body fat based on height and weight. Your health care provider can help determine your BMI and help you achieve or maintain a healthy weight. Get regular exercise Get regular exercise. This is one of the most important things you can do for your health. Most adults should:  Exercise for at least 150 minutes each week. The exercise should increase your heart rate and make you sweat (moderate-intensity exercise).  Do strengthening exercises at least twice a week. This is in addition to the moderate-intensity exercise.  Spend less time sitting. Even light physical activity can be beneficial. Watch cholesterol and blood lipids Have your blood tested for lipids and cholesterol at 63 years of age, then have this test every 5 years. You may need to have your cholesterol levels checked more often if:  Your lipid or cholesterol levels are high.  You are older than 63 years of age.  You are at high risk for heart disease. What  should I know about cancer screening? Many types of cancers can be detected early and may often be prevented. Depending on your health history and family history, you may need to have cancer screening at various ages. This may include screening for:  Colorectal cancer.  Prostate cancer.  Skin cancer.  Lung cancer. What should I know about heart disease, diabetes, and high blood pressure? Blood pressure and heart disease  High blood pressure causes heart disease and increases the risk of stroke. This is more likely to develop in people who have high blood pressure readings, are of African descent, or are overweight.  Talk with your health care provider about your target blood pressure readings.  Have your blood pressure checked: ? Every 3-5 years if you are 2-40 years of age. ? Every year if you are 65 years old or older.  If you are between the ages of 27 and 41 and are a current or former smoker, ask your health care provider if you should have a one-time screening for abdominal aortic aneurysm (AAA). Diabetes Have regular diabetes screenings. This checks your fasting blood sugar level. Have the screening done:  Once every three years after age 65 if you are at a normal weight and have a low risk for diabetes.  More often and at a younger age if you are overweight or have a high risk for diabetes. What should I know about preventing infection? Hepatitis B If you have a higher risk for hepatitis B, you should be screened for  this virus. Talk with your health care provider to find out if you are at risk for hepatitis B infection. Hepatitis C Blood testing is recommended for:  Everyone born from [birth year] through 1964.  Anyone with known risk factors for hepatitis C. Sexually transmitted infections (STIs)  You should be screened each year for STIs, including gonorrhea and chlamydia, if: ? You are sexually active and are younger than 63 years of age. ? You are older than 63 years of  age and your health care provider tells you that you are at risk for this type of infection. ? Your sexual activity has changed since you were last screened, and you are at increased risk for chlamydia or gonorrhea. Ask your health care provider if you are at risk.  Ask your health care provider about whether you are at high risk for HIV. Your health care provider may recommend a prescription medicine to help prevent HIV infection. If you choose to take medicine to prevent HIV, you should first get tested for HIV. You should then be tested every 3 months for as long as you are taking the medicine. Follow these instructions at home: Lifestyle  Do not use any products that contain nicotine or tobacco, such as cigarettes, e-cigarettes, and chewing tobacco. If you need help quitting, ask your health care provider.  Do not use street drugs.  Do not share needles.  Ask your health care provider for help if you need support or information about quitting drugs. Alcohol use  Do not drink alcohol if your health care provider tells you not to drink.  If you drink alcohol: ? Limit how much you have to 0-2 drinks a day. ? Be aware of how much alcohol is in your drink. In the [place] one drink equals one 12 oz bottle of beer (355 mL), one 5 oz glass of wine (148 mL), or one 1 oz glass of hard liquor (44 mL). General instructions  Schedule regular health, dental, and eye exams.  Stay current with your vaccines.  Tell your health care provider if: ? You often feel depressed. ? You have ever been abused or do not feel safe at home. Summary  Adopting a healthy lifestyle and getting preventive care are important in promoting health and wellness.  Follow your health care provider's instructions about healthy diet, exercising, and getting tested or screened for diseases.  Follow your health care provider's instructions on monitoring your cholesterol and blood pressure. This information is not intended  to replace advice given to you by your health care provider. Make sure you discuss any questions you have with your health care provider. Document Revised: 12/15/2017 Document Reviewed: 12/15/2017 Elsevier Patient Education  2020 [employer].

## 2019-02-27 NOTE — Assessment & Plan Note (Signed)
Chronic, stable on current levothyroxine dose - continue.  

## 2019-02-27 NOTE — Assessment & Plan Note (Signed)
Chronic, deteriorated despite lovastatin - he did miss several days recently. Consider rpt FLP in 6 months and then reassess statin.  The 10-year ASCVD risk score [name] DC Brooke Bonito., et al., 2013) is: 12.9%   Values used to calculate the score:     Age: 64 years     Sex: Male     Is Non-Hispanic African American: No     Diabetic: No     Tobacco smoker: No     Systolic Blood Pressure: 0000000 mmHg     Is BP treated: Yes     HDL Cholesterol: 58.5 mg/dL     Total Cholesterol: 218 mg/dL

## 2019-02-27 NOTE — Assessment & Plan Note (Signed)
Encouraged ongoing efforts at sustainable diet and lifestyle changes  Weight gain noted after recent vacation to the [place].

## 2019-03-13 ENCOUNTER — Other Ambulatory Visit: Payer: Self-pay

## 2019-03-13 ENCOUNTER — Encounter: Payer: Self-pay | Admitting: Family Medicine

## 2019-03-13 ENCOUNTER — Ambulatory Visit (INDEPENDENT_AMBULATORY_CARE_PROVIDER_SITE_OTHER): Payer: No Typology Code available for payment source

## 2019-03-13 DIAGNOSIS — I771 Stricture of artery: Secondary | ICD-10-CM

## 2019-03-13 DIAGNOSIS — I6523 Occlusion and stenosis of bilateral carotid arteries: Secondary | ICD-10-CM

## 2019-03-13 DIAGNOSIS — R011 Cardiac murmur, unspecified: Secondary | ICD-10-CM

## 2019-03-14 ENCOUNTER — Ambulatory Visit (INDEPENDENT_AMBULATORY_CARE_PROVIDER_SITE_OTHER): Payer: No Typology Code available for payment source

## 2019-03-14 ENCOUNTER — Ambulatory Visit
Admission: RE | Admit: 2019-03-14 | Discharge: 2019-03-14 | Disposition: A | Discharge status: Home / Self Care | Payer: No Typology Code available for payment source | Source: Ambulatory Visit | Attending: Family Medicine | Admitting: Family Medicine

## 2019-03-14 DIAGNOSIS — R011 Cardiac murmur, unspecified: Secondary | ICD-10-CM

## 2019-03-14 MED ORDER — PERFLUTREN LIPID MICROSPHERE
1.0000 mL | INTRAVENOUS | Status: AC | PRN
  Administered 2019-03-14: 2 mL via INTRAVENOUS

## 2019-03-15 ENCOUNTER — Encounter: Payer: Self-pay | Admitting: Family Medicine

## 2019-03-17 ENCOUNTER — Encounter: Payer: Self-pay | Admitting: Family Medicine

## 2019-03-17 DIAGNOSIS — I6502 Occlusion and stenosis of left vertebral artery: Secondary | ICD-10-CM | POA: Insufficient documentation

## 2019-03-17 DIAGNOSIS — I771 Stricture of artery: Secondary | ICD-10-CM | POA: Insufficient documentation

## 2019-03-17 NOTE — Telephone Encounter (Signed)
Cardiology referral placed. If takes longer than 1-2 wks to see them, please schedule in office visit with me for week of 03/20/2019 to repeat EKG.

## 2019-03-17 NOTE — Telephone Encounter (Signed)
Replied via lab result  

## 2019-04-04 ENCOUNTER — Other Ambulatory Visit: Payer: Self-pay

## 2019-04-04 ENCOUNTER — Ambulatory Visit (INDEPENDENT_AMBULATORY_CARE_PROVIDER_SITE_OTHER): Payer: No Typology Code available for payment source | Admitting: Cardiovascular Disease

## 2019-04-04 ENCOUNTER — Encounter: Payer: Self-pay | Admitting: Cardiovascular Disease

## 2019-04-04 VITALS — BP 166/72 | HR 65 | Ht 75.0 in | Wt 279.4 lb

## 2019-04-04 DIAGNOSIS — R002 Palpitations: Secondary | ICD-10-CM | POA: Diagnosis not present

## 2019-04-04 DIAGNOSIS — I739 Peripheral vascular disease, unspecified: Secondary | ICD-10-CM

## 2019-04-04 DIAGNOSIS — I6523 Occlusion and stenosis of bilateral carotid arteries: Secondary | ICD-10-CM | POA: Diagnosis not present

## 2019-04-04 DIAGNOSIS — E782 Mixed hyperlipidemia: Secondary | ICD-10-CM

## 2019-04-04 DIAGNOSIS — I700 Atherosclerosis of aorta: Secondary | ICD-10-CM

## 2019-04-04 DIAGNOSIS — I10 Essential (primary) hypertension: Secondary | ICD-10-CM

## 2019-04-04 DIAGNOSIS — R600 Localized edema: Secondary | ICD-10-CM

## 2019-04-04 MED ORDER — LOSARTAN POTASSIUM-HCTZ 100-25 MG PO TABS: 1.0000 | ORAL_TABLET | Freq: Every day | ORAL | 3 refills | Status: DC

## 2019-04-04 NOTE — Patient Instructions (Addendum)
BMP in 3 weeks Go to [facility] entrance of the hospital and check in at the registration desk and they will guide you where to go for labs. No appointment is needed for this and it would need to be roughly around Apirl 14th to get them done. Please give [place] a call if you have any questions.     Medication Instructions:  Stop amlodipine Start losartan HCTZ 100/25 mg daily  Think about zetia if cholesterol continues to stay high  If you need a refill on your cardiac medications before your next appointment, please call your pharmacy.    Lab work: Labs in 3 weeks   If you have labs (blood work) drawn today and your tests are completely normal, you will receive your results only by: Marland Kitchen MyChart Message (if you have MyChart) OR . A paper copy in the mail If you have any lab test that is abnormal or we need to change your treatment, we will call you to review the results.   Testing/Procedures: No new testing needed   Follow-Up: At [facility], you and your health needs are our priority.  As part of our continuing mission to provide you with exceptional heart care, we have created designated Provider Care Teams.  These Care Teams include your primary Cardiologist (physician) and Advanced Practice Providers (APPs -  Physician Assistants and Nurse Practitioners) who all work together to provide you with the care you need, when you need it.  . You will need a follow up appointment as needed   . Providers on your designated Care Team:   . Murray Hodgkins, NP . Christell Faith, PA-C . Marrianne Mood, PA-C  Any Other Special Instructions Will Be Listed Below (If Applicable).  For educational health videos Log in to : [email or web address] Or : [email or web address], password : [email or web address]

## 2019-04-04 NOTE — Progress Notes (Signed)
Cardiology Office Note  Date:  04/04/2019   ID:  Robert, Shea 1955/05/17, MRN [id number]  PCP:  Ria Bush, MD   Chief Complaint  Patient presents with  . New Patient (Initial Visit)    Ref by Dr. Danise Mina for bilateral carotid artery stenosis. Patient reports palpitations every evening. Meds verbally reviewed with patient.    HPI:  [name] is a 64 year old gentleman with past medical history of murmur osa on CPAP Hypertension Hyperlipidemia Former smoker Who presents by referral from Dr. Danise Mina for consultation of his carotid disease, palpitations  General reports he is doing well For the past several when he lays on his back at night Describes him as a extra beat, fluttering, hesitancy, with a pause Not much symptoms in the daytime Mainly when he lays down at night Has been eating some candy before bed  Weight previously 320 down to 240 pounds to dietary changes Then covid, Weight back up 270 More candy  He reports having worsening lower extremity edema, pitting, worse if he is on an airplane or on his feet for long periods of time  Recent imaging studies reviewed Right Carotid: Velocities in the right ICA are consistent with a 1-39%  stenosis. Non-hemodynamically significant plaque <50% noted in the  CCA.  Left Carotid: Velocities in the left ICA are consistent with a 1-39%  stenosis.   Echocardiogram with LVH, normal LV function, no significant valvular heart disease  CT chest August 2019 images pulled up and reviewed Very mild aortic atherosclerosis, aortic atherosclerosis in the descending aorta, mild coronary calcification in the proximal LAD  EKG personally reviewed by myself on todays visit Shows normal sinus rhythm rate 65 bpm no significant ST-T wave changes   PMH:   has a past medical history of Ex-smoker (2009), Gout, Heart murmur (longstanding), History of chicken pox, History of colon polyps (2010), History of measles, HLD  (hyperlipidemia), HTN (hypertension) (2010), Hypothyroidism (2010), Obesity, and PMR (polymyalgia rheumatica) ([place]) (01/30/2014).  PSH:    Past Surgical History:  Procedure Laterality Date  . COLONOSCOPY  07/2009   2 adenomas rpt 5 yrs Collie Siad)  . COLONOSCOPY  05/2013   1 hyperplastic polyp, rpt 10 yrs Ardis Hughs)  . VASECTOMY  1995    Current Outpatient Medications  Medication Sig Dispense Refill  . allopurinol (ZYLOPRIM) 300 MG tablet TAKE 1 TABLET BY MOUTH EVERY DAY 90 tablet 0  . amLODipine (NORVASC) 10 MG tablet TAKE 1 TABLET BY MOUTH EVERY DAY 90 tablet 3  . Colchicine 0.6 MG CAPS TAKE 2 TABLETS BY MOUTH DAILY AS NEEDED 180 capsule 0  . levothyroxine (SYNTHROID) 100 MCG tablet TAKE 1 TABLET (100 MCG TOTAL) BY MOUTH DAILY BEFORE BREAKFAST. 90 tablet 0  . lovastatin (MEVACOR) 40 MG tablet TAKE 1 TABLET BY MOUTH EVERYDAY AT BEDTIME 90 tablet 1  . phentermine 30 MG capsule TAKE 1 CAPSULE BY MOUTH EVERY DAY IN THE MORNING 30 capsule 0  . sildenafil (VIAGRA) 100 MG tablet TAKE HALF TO 1 TABLET BY MOUTH EVERY DAYAS NEEDED 10 tablet 3  . losartan-hydrochlorothiazide (HYZAAR) 100-25 MG tablet Take 1 tablet by mouth daily. 90 tablet 3   No current facility-administered medications for this visit.    Allergies:   Uloric [febuxostat]   Social History:  The patient  reports that he quit smoking about 12 years ago. He has a 34.00 pack-year smoking history. He has never used smokeless tobacco. He reports current alcohol use. He reports that he does not use  drugs.   Family History:   family history includes CAD (age of onset: 51) in his brother; CAD (age of onset: 19) in his paternal grandfather; Cancer in his maternal uncle; Cancer (age of onset: 60) in his father; Diabetes in his brother, brother, and mother; Healthy in his son; Stroke in his paternal grandfather.    Review of Systems: Review of Systems  Constitutional: Negative.   HENT: Negative.   Respiratory: Negative.   Cardiovascular:  Positive for palpitations.  Gastrointestinal: Negative.   Musculoskeletal: Negative.   Neurological: Negative.   Psychiatric/Behavioral: Negative.   All other systems reviewed and are negative.    PHYSICAL EXAM: VS:  BP (!) 166/72 (BP Location: Right Arm, Patient Position: Sitting, Cuff Size: Normal)   Pulse 65   Ht 6\' 3"  (1.905 m)   Wt 279 lb 6 oz (126.7 kg)   SpO2 98%   BMI 34.92 kg/m  , BMI Body mass index is 34.92 kg/m. GEN: Well nourished, well developed, in no acute distress HEENT: normal Neck: no JVD, carotid bruits, or masses Cardiac: RRR; 1/6 SEM RSB, rubs, or gallops,no edema  Trace pitting lower extremity edema Respiratory:  clear to auscultation bilaterally, normal work of breathing GI: soft, nontender, nondistended, + BS MS: no deformity or atrophy Skin: warm and dry, no rash Neuro:  Strength and sensation are intact Psych: euthymic mood, full affect   Recent Labs: 01/11/2019: ALT 23; BUN 19; Creatinine, Ser 1.08; Potassium 4.5; Sodium 138; TSH 3.82    Lipid Panel Lab Results  Component Value Date   CHOL 218 (H) 01/11/2019   HDL 58.50 01/11/2019   LDLCALC 137 (H) 01/11/2019   TRIG 113.0 01/11/2019     Wt Readings from Last 3 Encounters:  04/04/19 279 lb 6 oz (126.7 kg)  02/27/19 270 lb 8 oz (122.7 kg)  02/21/19 255 lb (115.7 kg)     ASSESSMENT AND PLAN:  Problem List Items Addressed This Visit      Cardiology Problems   Carotid stenosis   Relevant Medications   losartan-hydrochlorothiazide (HYZAAR) 100-25 MG tablet   HLD (hyperlipidemia)   Relevant Medications   losartan-hydrochlorothiazide (HYZAAR) 100-25 MG tablet    Other Visit Diagnoses    PAD (peripheral artery disease) (HCC)    -  Primary   Relevant Medications   losartan-hydrochlorothiazide (HYZAAR) 100-25 MG tablet   Aortic atherosclerosis (HCC)       Relevant Medications   losartan-hydrochlorothiazide (HYZAAR) 100-25 MG tablet   Palpitations       Relevant Orders   EKG  12-Lead   Benign essential HTN       Relevant Medications   losartan-hydrochlorothiazide (HYZAAR) 100-25 MG tablet   Leg edema         PAD Mild aortic atherosclerosis and mild coronary calcification noted on recent scans CT images pulled up and reviewed Stressed importance of aggressive cholesterol management He is a former smoker  Essential hypertension Given his lower extremity edema we will stop the amlodipine and start losartan HCTZ 100/25 mg daily BMP in 3 weeks time  Leg edema Likely exacerbated by amlodipine, unable to exclude mild fluid retention Medication changes as above  Palpitations Likely having APCs or PVCs last 2 months Recommend he cut back on candy before bed Discussed potential use of beta-blockers if symptoms do not improve Also discussed a ZIO monitor if they do not resolve  Hyperlipidemia Recommended he stay on lovastatin daily Currently with no myalgias Given goal LDL less than 70, may need to  add Zetia  Left ventricular hypertrophy Noted on echocardiogram Stressed the need for aggressive blood pressure control Already on CPAP Left arm modification   Disposition:   F/U as needed   Total encounter time more than 60 minutes  Greater than 50% was spent in counseling and coordination of care with the patient  Patient was seen in consultation for Dr. Danise Mina will refer back to his office for ongoing care of the issues detailed above  Signed, Esmond Plants, M.D., Ph.D. [place], [place]

## 2019-04-05 NOTE — Progress Notes (Signed)
Mychart message sent to patient about labs due in 3 weeks with instructions where to go. Updated medication list and orders entered.

## 2019-04-05 NOTE — Addendum Note (Signed)
Addended by: Valora Corporal on: 04/05/2019 07:33 AM   Modules accepted: Orders

## 2019-04-18 ENCOUNTER — Encounter: Payer: Self-pay | Admitting: Family Medicine

## 2019-04-18 NOTE — Telephone Encounter (Signed)
Does patient need an appointment first?

## 2019-04-21 ENCOUNTER — Other Ambulatory Visit: Payer: Self-pay | Admitting: Family Medicine

## 2019-04-21 NOTE — Telephone Encounter (Signed)
Name of Medication: Phentermine Name of Pharmacy: [facility] or Written Date and Quantity: 01/20/19, #30 Last Office Visit and Type: 02/27/19, CPE Next Office Visit and Type: none Last Controlled Substance Agreement Date: none Last UDS: none

## 2019-04-21 NOTE — Telephone Encounter (Signed)
ERx 

## 2019-05-19 ENCOUNTER — Other Ambulatory Visit: Payer: Self-pay | Admitting: Family Medicine

## 2019-05-19 IMAGING — CT CT CHEST LUNG CANCER SCREENING LOW DOSE W/O CM
2 of 5 series · 15 of 40 positions shown, 18 images · non-contrast
Comparison: None.

CLINICAL DATA: 62-year-old male former smoker, quit 10 years ago,
with 34 pack-year history of smoking, for initial lung cancer
screening

EXAM:
CT CHEST WITHOUT CONTRAST LOW-DOSE FOR LUNG CANCER SCREENING
TECHNIQUE: Multidetector CT imaging of the chest was performed following the
standard protocol without IV contrast.

[Series 3: lung · axial · 0.84mm/px · z∈[-1331,-971]mm · 12 of 397 slices shown, 15 images (1 of 2)]
[im 19/397  mediastinal]
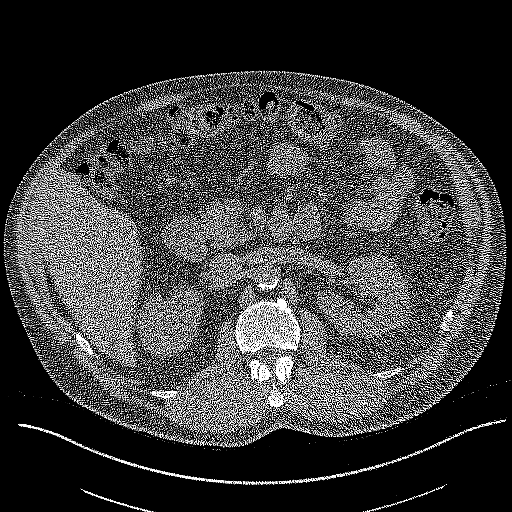
[im 19/397  lung]
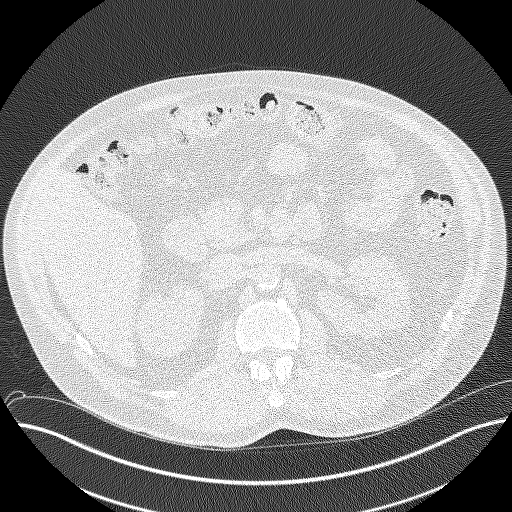
[im 55/397  lung]
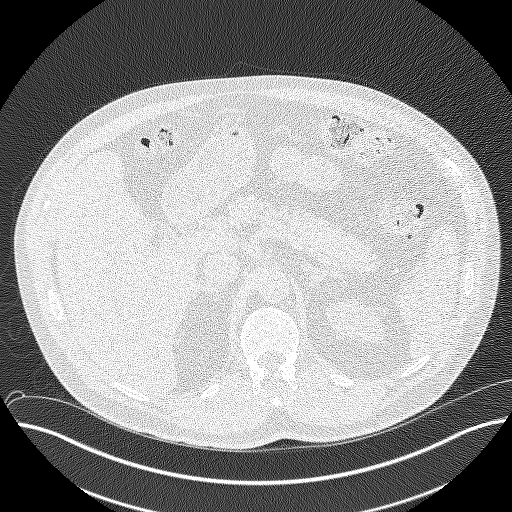
[im 91/397  lung]
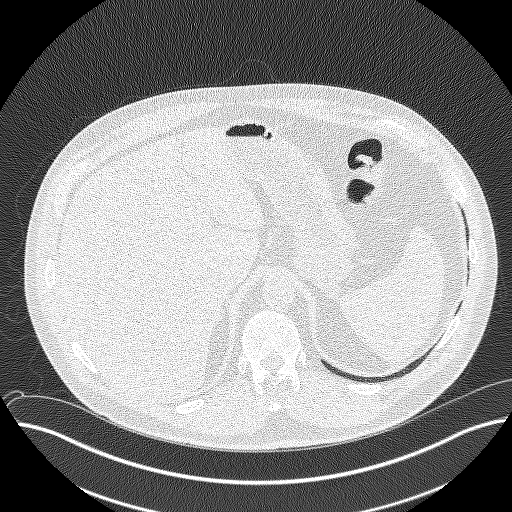
[im 127/397  lung]
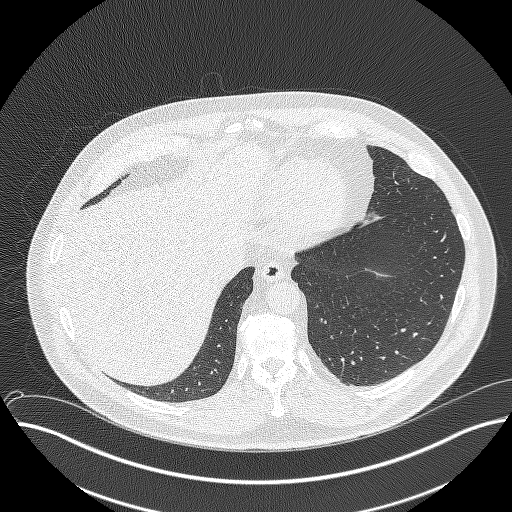
[im 145/397  mediastinal]
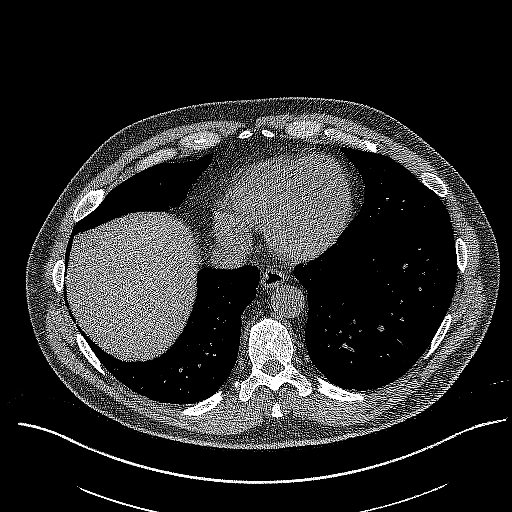
[im 145/397  lung]
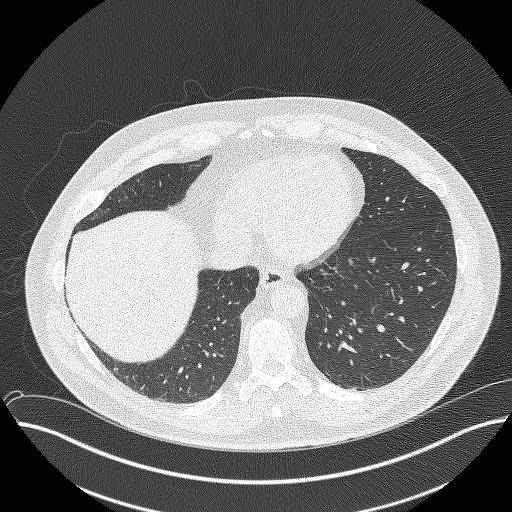
[im 181/397  lung]
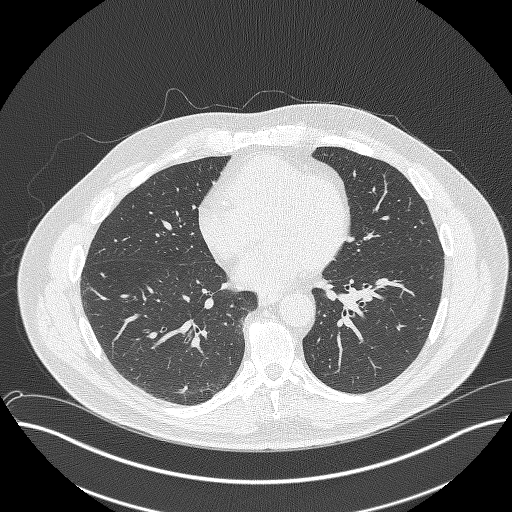
[im 217/397  lung]
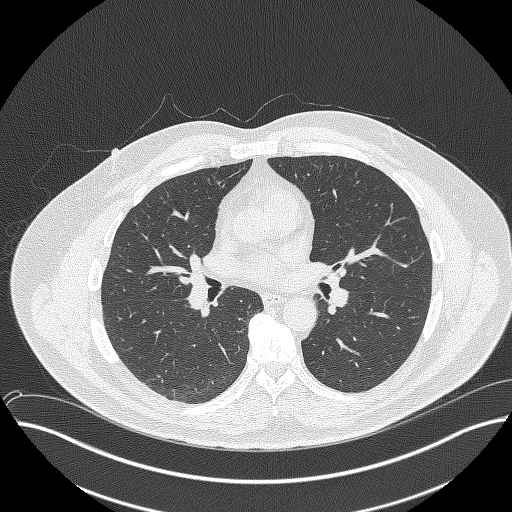
[im 253/397  lung]
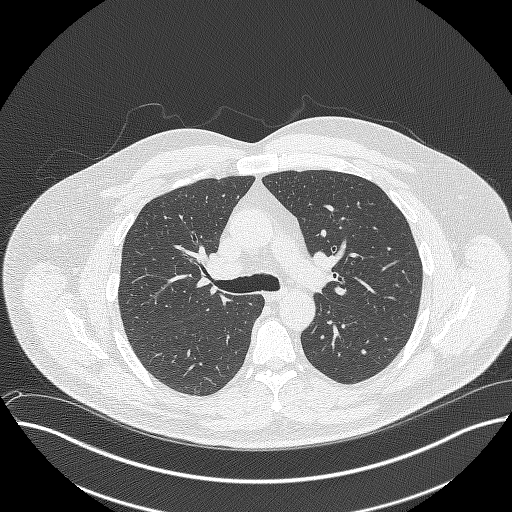
[im 271/397  mediastinal]
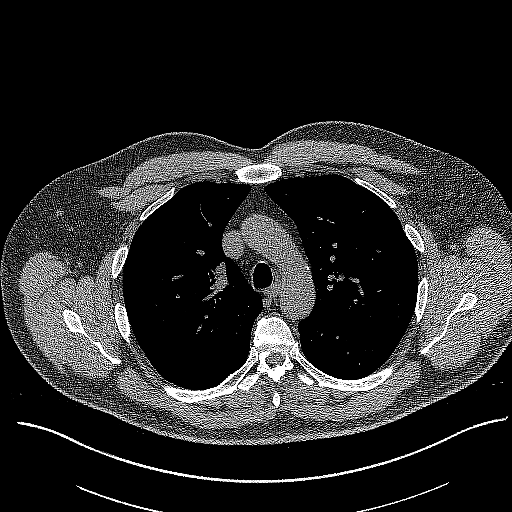
[im 271/397  lung]
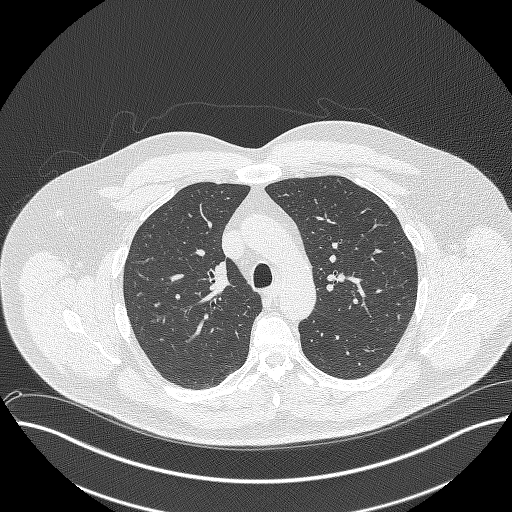
[im 307/397  lung]
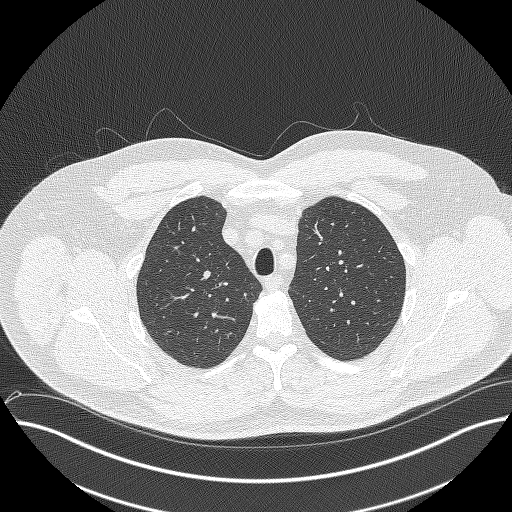
[im 343/397  lung]
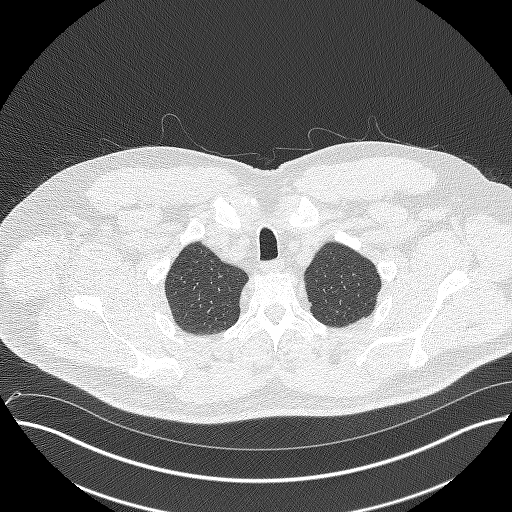
[im 379/397  lung]
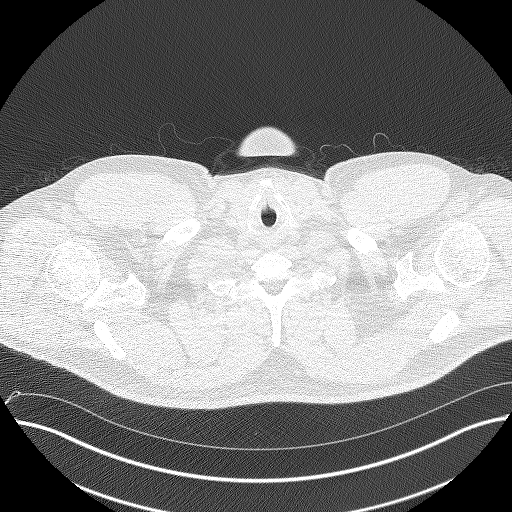

[Series 4: lung · coronal · 0.78mm/px · 3 of 325 slices shown (2 of 2)]
[im 65/325  lung]
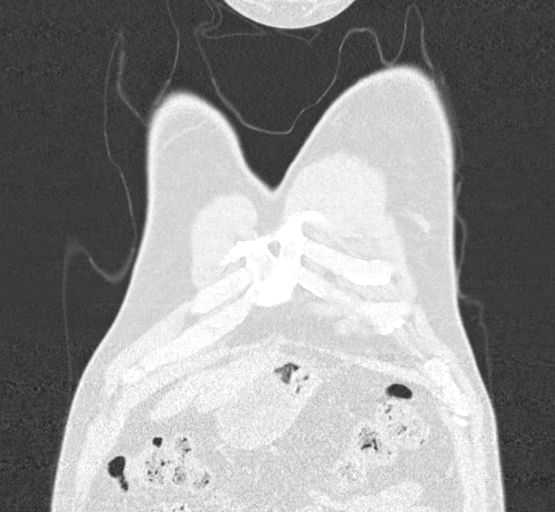
[im 130/325  lung]
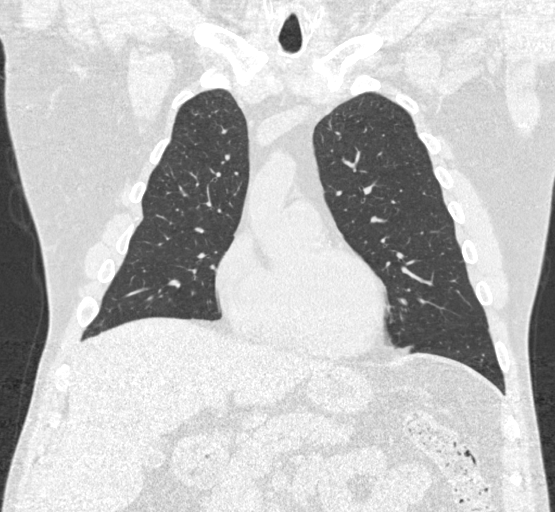
[im 195/325  lung]
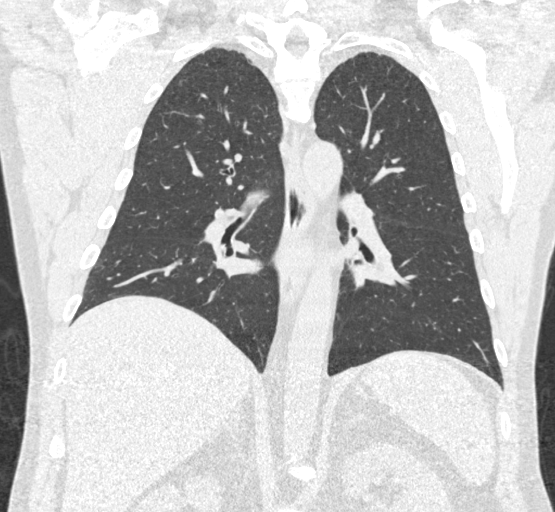

[15 of 40 positions shown; findings below may reference images not displayed]

FINDINGS: Cardiovascular: Heart is normal in size.  No pericardial effusion.

No evidence of thoracic aortic aneurysm. Mild atherosclerotic
calcifications of the aortic arch.

Mild coronary atherosclerosis the LAD.

Mediastinum/Nodes: No suspicious mediastinal lymphadenopathy.

Visualized thyroid is unremarkable.

Lungs/Pleura: Mild biapical pleural-parenchymal scarring.

Mild centrilobular emphysematous changes, upper lobe predominant.

No focal consolidation.

3.9 mm subpleural nodule in the lateral left upper lobe.

No pleural effusion or pneumothorax.

Upper Abdomen: Visualized upper abdomen is unremarkable, noting
vascular calcifications.

Musculoskeletal: Degenerative changes of the visualized
thoracolumbar spine.
IMPRESSION: Lung-RADS 2, benign appearance or behavior. Continue annual
screening with low-dose chest CT without contrast in 12 months.

Aortic Atherosclerosis (6CPBR-Y88.8) and Emphysema (6CPBR-P7N.N).

## 2019-05-19 MED ORDER — PHENTERMINE HCL 30 MG PO CAPS: ORAL_CAPSULE | ORAL | 0 refills | Status: DC

## 2019-05-19 NOTE — Telephone Encounter (Signed)
Refilled. Needs OV prior to more refills.

## 2019-05-19 NOTE — Telephone Encounter (Signed)
Name of Medication: Phentermine Name of Pharmacy: [facility] or Written Date and Quantity: 04/21/19, #30 Last Office Visit and Type: 02/27/19, CPE Next Office Visit and Type: none Last Controlled Substance Agreement Date: none Last UDS: none

## 2019-05-31 NOTE — Progress Notes (Deleted)
Office Visit Note  Patient: Robert Shea             Date of Birth: 1955/02/21           MRN: [id number]             PCP: Ria Bush, MD Referring: Ria Bush, MD Visit Date: 06/14/2019 Occupation: @GUAROCC @  Subjective:  No chief complaint on file.   History of Present Illness: Robert Shea is a 64 y.o. male ***   Activities of Daily Living:  Patient reports morning stiffness for *** {minute/hour:19697}.   Patient {ACTIONS;DENIES/REPORTS:21021675::"Denies"} nocturnal pain.  Difficulty dressing/grooming: {ACTIONS;DENIES/REPORTS:21021675::"Denies"} Difficulty climbing stairs: {ACTIONS;DENIES/REPORTS:21021675::"Denies"} Difficulty getting out of chair: {ACTIONS;DENIES/REPORTS:21021675::"Denies"} Difficulty using hands for taps, buttons, cutlery, and/or writing: {ACTIONS;DENIES/REPORTS:21021675::"Denies"}  No Rheumatology ROS completed.   PMFS History:  Patient Active Problem List   Diagnosis Date Noted  . Stenosis of left subclavian artery ([place]) 03/17/2019  . Vertebral artery stenosis, left 03/17/2019  . Hand laceration 01/11/2019  . Systolic murmur [id number]  . Increased prostate specific antigen (PSA) velocity 12/21/2017  . Aortic arch atherosclerosis ([place]) 09/08/2017  . Emphysema of lung ([place]) 09/08/2017  . Rheumatoid factor positive 10/06/2016  . Lesion of lip 12/25/2015  . High risk medication use 10/28/2015  . Osteoarthritis of both hands 10/28/2015  . Osteoarthritis of multiple joints 10/28/2015  . Milia 10/25/2015  . OSA on CPAP 10/25/2015  . Thoracic back pain 06/11/2015  . PMR (polymyalgia rheumatica) (HCC) 01/30/2014  . Low back pain radiating to right lower extremity 01/01/2014  . Erectile dysfunction 09/19/2013  . Osteoarthritis of feet, bilateral 07/26/2013  . Prediabetes 03/25/2013  . Obesity, Class I, BMI 30-34.9   . Health care maintenance 03/15/2013  . Carotid stenosis 03/15/2013  . Gout   . HTN (hypertension)   . HLD  (hyperlipidemia)   . Hypothyroidism   . History of colon polyps   . Ex-smoker     Past Medical History:  Diagnosis Date  . Ex-smoker 2009   quit  . Gout   . Heart murmur longstanding  . History of chicken pox   . History of colon polyps 2010   benign  . History of measles   . HLD (hyperlipidemia)   . HTN (hypertension) 2010  . Hypothyroidism 2010  . Obesity   . PMR (polymyalgia rheumatica) ([place]) 01/30/2014    Family History  Problem Relation Age of Onset  . Cancer Father 108       bone?  . Cancer Maternal Uncle        throat, smoker  . Diabetes Mother   . Diabetes Brother   . CAD Paternal Grandfather 37       MI  . Stroke Paternal Grandfather   . CAD Brother 74       widowmaker  . Diabetes Brother   . Healthy Son   . Colon cancer Neg Hx   . Pancreatic cancer Neg Hx   . Stomach cancer Neg Hx   . Rectal cancer Neg Hx    Past Surgical History:  Procedure Laterality Date  . COLONOSCOPY  07/2009   2 adenomas rpt 5 yrs Collie Siad)  . COLONOSCOPY  05/2013   1 hyperplastic polyp, rpt 10 yrs Ardis Hughs)  . VASECTOMY  1995   Social History   Social History Narrative   Lives with wife   [place] time local   Occ: VP of sales   Edu: BS   Activity: gym 3x/wk  Diet: healthier recently - good water, fruits/vegetables daily   Immunization History  Administered Date(s) Administered  . Influenza,inj,Quad PF,6+ Mos 09/19/2013, 02/25/2015, 10/25/2015, 12/21/2017, 09/27/2018  . Pneumococcal Polysaccharide-23 04/17/2014  . Tdap 04/25/2014, 12/29/2018     Objective: Vital Signs: There were no vitals taken for this visit.   Physical Exam   Musculoskeletal Exam: ***  CDAI Exam: CDAI Score: -- Patient Global: --; Provider Global: -- Swollen: --; Tender: -- Joint Exam 06/14/2019   No joint exam has been documented for this visit   There is currently no information documented on the homunculus. Go to the Rheumatology activity and complete the  homunculus joint exam.  Investigation: No additional findings.  Imaging: No results found.  Recent Labs: Lab Results  Component Value Date   WBC 6.7 03/30/2018   HGB 14.1 03/30/2018   PLT 256 03/30/2018   NA 138 01/11/2019   K 4.5 01/11/2019   CL 102 01/11/2019   CO2 28 01/11/2019   GLUCOSE 134 (H) 01/11/2019   BUN 19 01/11/2019   CREATININE 1.08 01/11/2019   BILITOT 0.7 01/11/2019   ALKPHOS 69 01/11/2019   AST 21 01/11/2019   ALT 23 01/11/2019   PROT 7.4 01/11/2019   ALBUMIN 5.0 01/11/2019   CALCIUM 9.8 01/11/2019   GFRAA 98 03/30/2018    Speciality Comments: No specialty comments available.  Procedures:  No procedures performed Allergies: Uloric [febuxostat]   Assessment / Plan:     Visit Diagnoses: No diagnosis found.  Orders: No orders of the defined types were placed in this encounter.  No orders of the defined types were placed in this encounter.   Face-to-face time spent with patient was *** minutes. Greater than 50% of time was spent in counseling and coordination of care.  Follow-Up Instructions: No follow-ups on file.   Earnestine Mealing, CMA  Note - This record has been created using Editor, commissioning.  Chart creation errors have been sought, but may not always  have been located. Such creation errors do not reflect on  the standard of medical care.

## 2019-06-02 ENCOUNTER — Encounter: Payer: Self-pay | Admitting: Family Medicine

## 2019-06-09 ENCOUNTER — Other Ambulatory Visit: Payer: Self-pay | Admitting: Rheumatology

## 2019-06-09 NOTE — Telephone Encounter (Signed)
Last Visit: 11/30/18  Next Visit: 06/14/19 Labs: 01/11/19   Current Dose per office note on 11/30/2019: allopurinol 300 mg 1 tabletby mouth daily   Okay to refill per Dr. Estanislado Pandy

## 2019-06-14 ENCOUNTER — Ambulatory Visit: Payer: No Typology Code available for payment source | Admitting: Rheumatology

## 2019-06-18 ENCOUNTER — Other Ambulatory Visit: Payer: Self-pay | Admitting: Family Medicine

## 2019-06-19 NOTE — Telephone Encounter (Signed)
Last filled 05-19-19 #30 Last OV 02-27-19 No Future OV [facility]

## 2019-06-23 NOTE — Telephone Encounter (Signed)
ERx 

## 2019-07-17 ENCOUNTER — Encounter: Payer: Self-pay | Admitting: Family Medicine

## 2019-07-24 ENCOUNTER — Other Ambulatory Visit: Payer: Self-pay | Admitting: Family Medicine

## 2019-08-01 ENCOUNTER — Encounter: Payer: Self-pay | Admitting: Family Medicine

## 2019-08-01 ENCOUNTER — Other Ambulatory Visit: Payer: Self-pay

## 2019-08-01 ENCOUNTER — Ambulatory Visit (INDEPENDENT_AMBULATORY_CARE_PROVIDER_SITE_OTHER): Payer: No Typology Code available for payment source | Admitting: Family Medicine

## 2019-08-01 VITALS — BP 122/78 | HR 75 | Temp 97.4°F | Ht 75.0 in | Wt 267.6 lb

## 2019-08-01 DIAGNOSIS — I10 Essential (primary) hypertension: Secondary | ICD-10-CM | POA: Diagnosis not present

## 2019-08-01 DIAGNOSIS — E782 Mixed hyperlipidemia: Secondary | ICD-10-CM

## 2019-08-01 DIAGNOSIS — E669 Obesity, unspecified: Secondary | ICD-10-CM | POA: Diagnosis not present

## 2019-08-01 LAB — BASIC METABOLIC PANEL
BUN: 24 mg/dL — ABNORMAL HIGH (ref 6–23)
CO2: 28 mEq/L (ref 19–32)
Calcium: 10 mg/dL (ref 8.4–10.5)
Chloride: 100 mEq/L (ref 96–112)
Creatinine, Ser: 1.2 mg/dL (ref 0.40–1.50)
GFR: 61.12 mL/min (ref >60.00)
Glucose, Bld: 129 mg/dL — ABNORMAL HIGH (ref 70–99)
Potassium: 4 mEq/L (ref 3.5–5.1)
Sodium: 137 mEq/L (ref 135–145)

## 2019-08-01 LAB — LIPID PANEL
Cholesterol: 205 mg/dL — ABNORMAL HIGH (ref 0–200)
HDL: 52.8 mg/dL (ref >39.00)
LDL Cholesterol: 122 mg/dL — ABNORMAL HIGH (ref 0–99)
NonHDL: 152.07
Total CHOL/HDL Ratio: 4
Triglycerides: 150 mg/dL — ABNORMAL HIGH (ref 0.0–149.0)
VLDL: 30 mg/dL (ref 0.0–40.0)

## 2019-08-01 MED ORDER — PHENTERMINE HCL 30 MG PO CAPS: 30.0000 mg | ORAL_CAPSULE | ORAL | 1 refills | Status: DC

## 2019-08-01 NOTE — Patient Instructions (Addendum)
Phentermine refilled Labs today (kidney check, cholesterol levels). Goal LDL <70.  Blood pressures are looking good - continue current medicines.  Return as needed or in 6 weeks for follow up visit.

## 2019-08-01 NOTE — Progress Notes (Signed)
This visit was conducted in person.  BP 122/78 (BP Location: Left Arm, Patient Position: Sitting, Cuff Size: Normal)   Pulse 75   Temp (!) 97.4 F (36.3 C) (Temporal)   Ht 6\' 3"  (1.905 m)   Wt (!) 267 lb 9.6 oz (121.4 kg)   SpO2 98%   BMI 33.45 kg/m    CC: weight follow up  Subjective:    Patient ID: Robert Shea, male    DOB: September 05, 1955, 64 y.o.   MRN: [id number]  HPI: Robert Shea is a 64 y.o. male presenting on 08/01/2019 for Medication Refill (Pt is needing refills of Phentermine. Pt has lost 12lbs since March)   Starting weight 280s lbs (04/2019) Today's weight 267 lbs  Goal weight is <250 lbs   Currently on phentermine 30mg  daily, regular exercise routine (goes to gym regularly walks 2 miles almost daily as well as some other exercise (resistance training, basketball ,etc)), limiting portion sizes, drinking water. Diet reviewed. Limits breads/carbs.   Tolerating med well without headache, chest pain, palpitations, insomnia. Finds it does help curb his appetite.   Recent trip to [place] - weight gain noted.   Previous trial - belviq caused dysthymia.  He states he had reassuring cardiac evaluation earlier this year.      Relevant past medical, surgical, family and social history reviewed and updated as indicated. Interim medical history since our last visit reviewed. Allergies and medications reviewed and updated. Outpatient Medications Prior to Visit  Medication Sig Dispense Refill  . allopurinol (ZYLOPRIM) 300 MG tablet TAKE 1 TABLET BY MOUTH EVERY DAY 90 tablet 0  . Colchicine 0.6 MG CAPS TAKE 2 TABLETS BY MOUTH DAILY AS NEEDED 180 capsule 0  . levothyroxine (SYNTHROID) 100 MCG tablet TAKE 1 TABLET (100 MCG TOTAL) BY MOUTH DAILY BEFORE BREAKFAST. 90 tablet 3  . losartan-hydrochlorothiazide (HYZAAR) 100-25 MG tablet Take 1 tablet by mouth daily. 90 tablet 3  . sildenafil (VIAGRA) 100 MG tablet TAKE HALF TO 1 TABLET BY MOUTH EVERY DAYAS NEEDED 10 tablet 3  .  lovastatin (MEVACOR) 40 MG tablet TAKE 1 TABLET BY MOUTH EVERYDAY AT BEDTIME 90 tablet 3  . phentermine 30 MG capsule TAKE 1 CAPSULE BY MOUTH EVERY DAY IN THE MORNING 30 capsule 0   No facility-administered medications prior to visit.     Per HPI unless specifically indicated in ROS section below Review of Systems Objective:  BP 122/78 (BP Location: Left Arm, Patient Position: Sitting, Cuff Size: Normal)   Pulse 75   Temp (!) 97.4 F (36.3 C) (Temporal)   Ht 6\' 3"  (1.905 m)   Wt (!) 267 lb 9.6 oz (121.4 kg)   SpO2 98%   BMI 33.45 kg/m   Wt Readings from Last 3 Encounters:  08/01/19 (!) 267 lb 9.6 oz (121.4 kg)  04/04/19 279 lb 6 oz (126.7 kg)  02/27/19 270 lb 8 oz (122.7 kg)      Physical Exam Vitals and nursing note reviewed.  Constitutional:      Appearance: Normal appearance. He is not ill-appearing.  HENT:     Head: Normocephalic and atraumatic.  Cardiovascular:     Rate and Rhythm: Normal rate and regular rhythm.     Pulses: Normal pulses.     Heart sounds: Normal heart sounds. No murmur heard.   Pulmonary:     Effort: Pulmonary effort is normal. No respiratory distress.     Breath sounds: Normal breath sounds. No wheezing, rhonchi or rales.  Musculoskeletal:  Right lower leg: No edema.     Left lower leg: No edema.  Skin:    General: Skin is warm and dry.     Findings: No rash.  Neurological:     Mental Status: He is alert.  Psychiatric:        Mood and Affect: Mood normal.        Behavior: Behavior normal.       Results for orders placed or performed in visit on 44/81/85  Basic metabolic panel  Result Value Ref Range   Sodium 137 135 - 145 mEq/L   Potassium 4.0 3.5 - 5.1 mEq/L   Chloride 100 96 - 112 mEq/L   CO2 28 19 - 32 mEq/L   Glucose, Bld 129 (H) 70 - 99 mg/dL   BUN 24 (H) 6 - 23 mg/dL   Creatinine, Ser 1.20 0.40 - 1.50 mg/dL   GFR 61.12 >60.00 mL/min   Calcium 10.0 8.4 - 10.5 mg/dL  Lipid panel  Result Value Ref Range   Cholesterol 205  (H) 0 - 200 mg/dL   Triglycerides 150.0 (H) 0 - 149 mg/dL   HDL 52.80 >39.00 mg/dL   VLDL 30.0 0.0 - 40.0 mg/dL   LDL Cholesterol 122 (H) 0 - 99 mg/dL   Total CHOL/HDL Ratio 4    NonHDL 152.07    Assessment & Plan:  This visit occurred during the SARS-CoV-2 public health emergency.  Safety protocols were in place, including screening questions prior to the visit, additional usage of staff PPE, and extensive cleaning of exam room while observing appropriate contact time as indicated for disinfecting solutions.   Problem List Items Addressed This Visit    Obesity, Class I, BMI 30-34.9 - Primary    Continue to encourage healthy diet and lifestyle choices. Continue phentermine which is effective. Discussed possible change in regimen if weight loss medication needed past 6-8 months.       HTN (hypertension)    BP well controlled.       Relevant Medications   atorvastatin (LIPITOR) 40 MG tablet   HLD (hyperlipidemia)    Update labs on lovastatin 40mg  daily. New LDL goal <70. If above goal, discussed stronger statin. H/o simvastatin intolerance.  The 10-year ASCVD risk score [name] DC Brooke Bonito., et al., 2013) is: 11.6%   Values used to calculate the score:     Age: 25 years     Sex: Male     Is Non-Hispanic African American: No     Diabetic: No     Tobacco smoker: No     Systolic Blood Pressure: 631 mmHg     Is BP treated: Yes     HDL Cholesterol: 52.8 mg/dL     Total Cholesterol: 205 mg/dL       Relevant Medications   atorvastatin (LIPITOR) 40 MG tablet   Other Relevant Orders   Basic metabolic panel (Completed)   Lipid panel (Completed)       Meds ordered this encounter  Medications  . phentermine 30 MG capsule    Sig: Take 1 capsule (30 mg total) by mouth every morning.    Dispense:  30 capsule    Refill:  1    Not to exceed 5 additional fills before 11/15/2019  . atorvastatin (LIPITOR) 40 MG tablet    Sig: Take 1 tablet (40 mg total) by mouth daily.    Dispense:  30 tablet     Refill:  6    To replace lovastat   Orders  Placed This Encounter  Procedures  . Basic metabolic panel  . Lipid panel    Patient Instructions  Phentermine refilled Labs today (kidney check, cholesterol levels). Goal LDL <70.  Blood pressures are looking good - continue current medicines.  Return as needed or in 6 weeks for follow up visit.    Follow up plan: Return in about 6 weeks (around 09/12/2019) for follow up visit.  Ria Bush, MD

## 2019-08-02 MED ORDER — ATORVASTATIN CALCIUM 40 MG PO TABS
40.0000 mg | ORAL_TABLET | Freq: Every day | ORAL | 6 refills | Status: DC
Start: 2019-08-02 — End: 2020-03-14

## 2019-08-02 NOTE — Assessment & Plan Note (Signed)
Update labs on lovastatin 40mg  daily. New LDL goal <70. If above goal, discussed stronger statin. H/o simvastatin intolerance.  The 10-year ASCVD risk score [name] DC Brooke Bonito., et al., 2013) is: 11.6%   Values used to calculate the score:     Age: 63 years     Sex: Male     Is Non-Hispanic African American: No     Diabetic: No     Tobacco smoker: No     Systolic Blood Pressure: 259 mmHg     Is BP treated: Yes     HDL Cholesterol: 52.8 mg/dL     Total Cholesterol: 205 mg/dL

## 2019-08-02 NOTE — Assessment & Plan Note (Signed)
Continue to encourage healthy diet and lifestyle choices. Continue phentermine which is effective. Discussed possible change in regimen if weight loss medication needed past 6-8 months.

## 2019-08-02 NOTE — Assessment & Plan Note (Signed)
BP well controlled.

## 2019-08-03 ENCOUNTER — Ambulatory Visit: Payer: No Typology Code available for payment source | Admitting: Family Medicine

## 2019-09-14 ENCOUNTER — Other Ambulatory Visit: Payer: Self-pay | Admitting: Rheumatology

## 2019-10-05 ENCOUNTER — Other Ambulatory Visit: Payer: Self-pay | Admitting: Family Medicine

## 2019-10-05 ENCOUNTER — Encounter: Payer: Self-pay | Admitting: Family Medicine

## 2019-10-05 ENCOUNTER — Other Ambulatory Visit: Payer: Self-pay | Admitting: Rheumatology

## 2019-10-06 NOTE — Telephone Encounter (Signed)
ERx 

## 2019-10-06 NOTE — Telephone Encounter (Signed)
Name of Medication: Phentermine Name of Pharmacy: [facility], [place] or Written Date and Quantity: 09/02/19, #30 Last Office Visit and Type: 08/01/19, med refill Next Office Visit and Type: none Last Controlled Substance Agreement Date: none Last UDS: none

## 2019-11-13 ENCOUNTER — Other Ambulatory Visit: Payer: Self-pay | Admitting: Rheumatology

## 2019-12-12 ENCOUNTER — Other Ambulatory Visit: Payer: Self-pay | Admitting: Family Medicine

## 2019-12-13 ENCOUNTER — Ambulatory Visit (INDEPENDENT_AMBULATORY_CARE_PROVIDER_SITE_OTHER): Payer: No Typology Code available for payment source | Admitting: Family Medicine

## 2019-12-13 ENCOUNTER — Other Ambulatory Visit: Payer: Self-pay

## 2019-12-13 ENCOUNTER — Encounter: Payer: Self-pay | Admitting: Family Medicine

## 2019-12-13 VITALS — BP 130/74 | HR 69 | Temp 97.7°F | Ht 74.0 in | Wt 270.0 lb

## 2019-12-13 DIAGNOSIS — E669 Obesity, unspecified: Secondary | ICD-10-CM

## 2019-12-13 DIAGNOSIS — M1A09X0 Idiopathic chronic gout, multiple sites, without tophus (tophi): Secondary | ICD-10-CM | POA: Diagnosis not present

## 2019-12-13 DIAGNOSIS — Z23 Encounter for immunization: Secondary | ICD-10-CM

## 2019-12-13 MED ORDER — ALLOPURINOL 300 MG PO TABS: 300.0000 mg | ORAL_TABLET | Freq: Every day | ORAL | 3 refills | Status: DC

## 2019-12-13 NOTE — Assessment & Plan Note (Signed)
Discussed phentermine use.

## 2019-12-13 NOTE — Assessment & Plan Note (Signed)
Chronic, ran out of allopurinol.  We will take over this Rx. rec start 150mg  daily for 1-2 wks then increase to full 300mg  dose.  Doesn't need colchicine refilled at this time.  No recent gout flares

## 2019-12-13 NOTE — Patient Instructions (Addendum)
Flu shot today.  Start shingrix today. Return in 2-6 months for nurse visit to complete shingles series.  Restart allopurinol, 150mg  daily for 1-2 wks then increase to 300mg  daily.  Ok to continue phentermine as needed.  Return in 3-4 months for physical

## 2019-12-13 NOTE — Progress Notes (Signed)
Patient ID: Robert Shea, male    DOB: [birth year], 64 y.o.   MRN: [id number]  This visit was conducted in person.  BP 130/74   Pulse 69   Temp 97.7 F (36.5 C) (Temporal)   Ht 6\' 2"  (1.88 m)   Wt 270 lb (122.5 kg)   SpO2 99%   BMI 34.67 kg/m    CC: CPE - converted to acute visit due to being too early for rpt CPE Subjective:   HPI: Robert Shea is a 64 y.o. male presenting on 12/13/2019 for Follow-up (Covid--never), Flu Vaccine, and Immunizations (1st Shingrix shot)   Gout - overall well controlled on allopurinol 300mg  daily. Ran out 3 months ago. Was unable to get in with rheum. No recent flare. Has colchicine PRN flares.  Lab Results  Component Value Date   LABURIC 6.0 01/11/2019   Obesity - currently taking phentermine every 3 days PRN for appetite suppression. [facility] for 90 minutes every morning at 6am.      Relevant past medical, surgical, family and social history reviewed and updated as indicated. Interim medical history since our last visit reviewed. Allergies and medications reviewed and updated. Outpatient Medications Prior to Visit  Medication Sig Dispense Refill  . atorvastatin (LIPITOR) 40 MG tablet Take 1 tablet (40 mg total) by mouth daily. 30 tablet 6  . Colchicine 0.6 MG CAPS TAKE 2 TABLETS BY MOUTH DAILY AS NEEDED 180 capsule 0  . levothyroxine (SYNTHROID) 100 MCG tablet TAKE 1 TABLET (100 MCG TOTAL) BY MOUTH DAILY BEFORE BREAKFAST. 90 tablet 3  . losartan-hydrochlorothiazide (HYZAAR) 100-25 MG tablet Take 1 tablet by mouth daily. 90 tablet 3  . phentermine 30 MG capsule TAKE 1 CAPSULE BY MOUTH EVERY DAY IN THE MORNING 30 capsule 1  . sildenafil (VIAGRA) 100 MG tablet TAKE 1/2 TO 1 TABLET BY MOUTH DAILY AS NEEDED 10 tablet 3  . allopurinol (ZYLOPRIM) 300 MG tablet TAKE 1 TABLET BY MOUTH EVERY DAY 90 tablet 0   No facility-administered medications prior to visit.     Per HPI unless specifically  indicated in ROS section below Review of Systems Objective:  BP 130/74   Pulse 69   Temp 97.7 F (36.5 C) (Temporal)   Ht 6\' 2"  (1.88 m)   Wt 270 lb (122.5 kg)   SpO2 99%   BMI 34.67 kg/m   Wt Readings from Last 3 Encounters:  12/13/19 270 lb (122.5 kg)  08/01/19 (!) 267 lb 9.6 oz (121.4 kg)  04/04/19 279 lb 6 oz (126.7 kg)      Physical Exam Vitals and nursing note reviewed.  Constitutional:      Appearance: Normal appearance. He is not ill-appearing.  Cardiovascular:     Rate and Rhythm: Normal rate and regular rhythm.     Pulses: Normal pulses.     Heart sounds: Normal heart sounds. No murmur heard.   Pulmonary:     Effort: Pulmonary effort is normal. No respiratory distress.     Breath sounds: Normal breath sounds. No wheezing, rhonchi or rales.  Musculoskeletal:     Right lower leg: No edema.     Left lower leg: No edema.  Skin:    General: Skin is warm and dry.     Findings: No rash.  Neurological:     Mental Status: He is alert.  Psychiatric:        Mood and Affect: Mood normal.  Behavior: Behavior normal.       Results for orders placed or performed in visit on 45/85/92  Basic metabolic panel  Result Value Ref Range   Sodium 137 135 - 145 mEq/L   Potassium 4.0 3.5 - 5.1 mEq/L   Chloride 100 96 - 112 mEq/L   CO2 28 19 - 32 mEq/L   Glucose, Bld 129 (H) 70 - 99 mg/dL   BUN 24 (H) 6 - 23 mg/dL   Creatinine, Ser 1.20 0.40 - 1.50 mg/dL   GFR 61.12 >60.00 mL/min   Calcium 10.0 8.4 - 10.5 mg/dL  Lipid panel  Result Value Ref Range   Cholesterol 205 (H) 0 - 200 mg/dL   Triglycerides 150.0 (H) 0 - 149 mg/dL   HDL 52.80 >39.00 mg/dL   VLDL 30.0 0.0 - 40.0 mg/dL   LDL Cholesterol 122 (H) 0 - 99 mg/dL   Total CHOL/HDL Ratio 4    NonHDL 152.07    Assessment & Plan:  This visit occurred during the SARS-CoV-2 public health emergency.  Safety protocols were in place, including screening questions prior to the visit, additional usage of staff PPE, and  extensive cleaning of exam room while observing appropriate contact time as indicated for disinfecting solutions.   Problem List Items Addressed This Visit    Obesity, Class I, BMI 30-34.9    Discussed phentermine use.       Gout - Primary    Chronic, ran out of allopurinol.  We will take over this Rx. rec start 150mg  daily for 1-2 wks then increase to full 300mg  dose.  Doesn't need colchicine refilled at this time.  No recent gout flares       Other Visit Diagnoses    Need for immunization against influenza       Relevant Orders   Flu Vaccine QUAD 36+ mos IM (Completed)   Need for shingles vaccine       Relevant Orders   Varicella-zoster vaccine IM (Shingrix) (Completed)       Meds ordered this encounter  Medications  . allopurinol (ZYLOPRIM) 300 MG tablet    Sig: Take 1 tablet (300 mg total) by mouth daily.    Dispense:  90 tablet    Refill:  3   Orders Placed This Encounter  Procedures  . Flu Vaccine QUAD 36+ mos IM  . Varicella-zoster vaccine IM (Shingrix)    Patient Instructions  Flu shot today.  Start shingrix today. Return in 2-6 months for nurse visit to complete shingles series.  Restart allopurinol, 150mg  daily for 1-2 wks then increase to 300mg  daily.  Ok to continue phentermine as needed.  Return in 3-4 months for physical   Follow up plan: Return in about 3 months (around 03/12/2020), or if symptoms worsen or fail to improve, for annual exam, prior fasting for blood work.  Ria Bush, MD

## 2019-12-18 ENCOUNTER — Other Ambulatory Visit: Payer: Self-pay | Admitting: Family Medicine

## 2019-12-19 NOTE — Telephone Encounter (Signed)
ERX

## 2019-12-19 NOTE — Telephone Encounter (Signed)
Name of Medication: Phentermine Name of Pharmacy: [facility] or Written Date and Quantity: 11/13/19, #30 Last Office Visit and Type: 12/13/19, gout f/u Next Office Visit and Type: none Last Controlled Substance Agreement Date: none Last UDS: none

## 2019-12-28 ENCOUNTER — Encounter: Payer: Self-pay | Admitting: Family Medicine

## 2019-12-28 NOTE — Telephone Encounter (Signed)
Robert Shea can you call centers to see if we are able to order this infusion for patient? [place] I did also recommend pt go to local [facility] for eval.

## 2019-12-28 NOTE — Telephone Encounter (Signed)
Patient has two places he could go for the infusion [facility] [place], [place]) [phone]

## 2019-12-28 NOTE — Telephone Encounter (Signed)
Patient has tested positive for Covid. [place] Patient is requesting a order to be put in for him to get the mono. Infusion. Please advise. EM

## 2020-01-01 MED ORDER — GUAIFENESIN-CODEINE 100-10 MG/5ML PO SYRP: 5.0000 mL | ORAL_SOLUTION | Freq: Two times a day (BID) | ORAL | 0 refills | Status: DC | PRN

## 2020-01-01 MED ORDER — ALBUTEROL SULFATE HFA 108 (90 BASE) MCG/ACT IN AERS: 2.0000 | INHALATION_SPRAY | Freq: Four times a day (QID) | RESPIRATORY_TRACT | 1 refills | Status: DC | PRN

## 2020-01-01 NOTE — Telephone Encounter (Signed)
Pt called in was told to his PCP to see about getting help with getting the antibody infusion.

## 2020-01-01 NOTE — Telephone Encounter (Signed)
Tried to contact [facility] [phone]).  Message states they are closed through today.    Lvm at [phone] for [facility], [place]).  Need to find out what is needed to order antibody infusion for the pt.

## 2020-01-01 NOTE — Telephone Encounter (Addendum)
Spoke with patient.  Initial high fever which as resolved.  Progressive cough, dyspnea. O2 sat staying 94%.  Rx albuterol inhaler, cheratussin Rx for night time.  Reviewed reasons to seek ER care.   plz call tomorrow afternoon for update on symptoms after mAb infusion scheduled for noon.

## 2020-01-01 NOTE — Addendum Note (Signed)
Addended by: Eustaquio Boyden on: 01/01/2020 06:09 PM   Modules accepted: Orders

## 2020-01-01 NOTE — Telephone Encounter (Signed)
Spoke with pt.  States he was seen at [facility] and they have ordered antibody infusion which he's scheduled for tomorrow.  In the meantime pt is requesting cough med, albuterol inhaler and z-pack be sent to [facility].  States the cough is keeping him up at night.  Pt says his brother is also recovering from COVID and was given "these medications" and it helped him.

## 2020-01-01 NOTE — Telephone Encounter (Signed)
Patient called in asking why his medication he requested has not been called in. Explained to the patient that Dr Reece Agar has been in clinic and that the message was sent to review request for medication. Patient stated to me " I don't want to wait to six oclock to see if my meds are called in. I need them by end of business day. I explained that Dr Reece Agar will get to it as soon as he can and no promises were given to when it would be sent. EM

## 2020-01-02 NOTE — Telephone Encounter (Signed)
Spoke with pt asking for update since infusion today.  Says he's not feeling any better just yet, although HA is gone.  Still c/o cough, SOB and extreme fatigue.

## 2020-01-02 NOTE — Telephone Encounter (Signed)
Noted  

## 2020-01-03 NOTE — Telephone Encounter (Signed)
plz call tomorrow for update on symptoms.

## 2020-01-04 ENCOUNTER — Telehealth: Payer: Self-pay | Admitting: Family Medicine

## 2020-01-04 NOTE — Telephone Encounter (Signed)
Spoke with pt asking for an update.  States he feels bad.  Thinks the infusion did not help.  The inhaler and cough med helps very little.  Still coughing all day and trying to cough up congestion.  Pt wants to know if there is anything else he can get to help with coughing it up and out.  Also, c/o feeling extremely tired and SOB.

## 2020-01-04 NOTE — Telephone Encounter (Signed)
Spoke with pt asking about O2.  States he was seen at [facility] today, O2 was 98%.  Had CXR, told it was "foggy", could be pneumonia.  Pt was given steroid and abx.  He will have UC notes.

## 2020-01-04 NOTE — Telephone Encounter (Signed)
First day of symptoms 12/26/2019.  See mychart message.  S/p mAb infusion Wednesday. Ongoing cough, fatigue, with dyspnea.  How are O2 sats? If worsening dyspnea or dropping oxygen levels recommend in person evaluation with return to urgent care. If O2 sat <90%, rec seek care at ER. These are the symptoms that make [place] worried about COVID pneumonia.

## 2020-01-04 NOTE — Telephone Encounter (Signed)
Pt called in wanted to speak to lisa and didn't tell me what the issue was

## 2020-01-04 NOTE — Telephone Encounter (Signed)
See today's phone note.  

## 2020-02-05 ENCOUNTER — Other Ambulatory Visit: Payer: Self-pay | Admitting: Cardiovascular Disease

## 2020-02-05 ENCOUNTER — Other Ambulatory Visit: Payer: Self-pay | Admitting: Family Medicine

## 2020-02-05 NOTE — Telephone Encounter (Signed)
Rx request sent to pharmacy.  

## 2020-02-06 NOTE — Telephone Encounter (Signed)
Pharmacy requests refill on: Phentermine 30 mg   LAST REFILL: 12/19/2019 (Q-30, R-0) LAST OV: 12/13/2019 NEXT OV: Not Scheduled  PHARMACY: [facility] [place], [place]

## 2020-02-07 ENCOUNTER — Other Ambulatory Visit: Payer: Self-pay

## 2020-02-07 DIAGNOSIS — R972 Elevated prostate specific antigen [PSA]: Secondary | ICD-10-CM

## 2020-02-08 ENCOUNTER — Other Ambulatory Visit: Payer: Self-pay | Admitting: Family Medicine

## 2020-02-08 NOTE — Telephone Encounter (Signed)
Duplicate request

## 2020-02-08 NOTE — Telephone Encounter (Signed)
I have refilled but would want to ensure COVID symptoms fully resolved - no persistent dyspnea or chest pain/tightness prior to resuming phentermine.

## 2020-02-09 NOTE — Telephone Encounter (Signed)
Spoke with pt asking about sxs.  States everything has resolved except minor nasal congestion, mainly in the mornings.  Notified pt refill was sent in.  FYI to Dr. Darnell Level.

## 2020-02-13 ENCOUNTER — Encounter: Payer: Self-pay | Admitting: Family Medicine

## 2020-02-13 NOTE — Telephone Encounter (Signed)
Spoke with [facility] asking about rx.  Told they have sent multiple messages to Dr. Donivan Scull office advising them there is a backorder on the combination losartan-hydrochlorothiazide.  But the doctor's office just keeps resending the same rx.  They are asking is it ok to separate the 2 meds and and dispense individually.  Told them I will let the pt know and see if he can contact Dr. Donivan Scull office.   Notified pt, of above, via MyChart.

## 2020-02-20 MED ORDER — LOSARTAN POTASSIUM 100 MG PO TABS: 100.0000 mg | ORAL_TABLET | Freq: Every day | ORAL | 3 refills | Status: DC

## 2020-02-20 MED ORDER — HYDROCHLOROTHIAZIDE 25 MG PO TABS: 25.0000 mg | ORAL_TABLET | Freq: Every day | ORAL | 3 refills | Status: DC

## 2020-02-20 NOTE — Addendum Note (Signed)
Addended by: Ria Bush on: 02/20/2020 09:25 AM   Modules accepted: Orders

## 2020-02-26 ENCOUNTER — Encounter: Payer: Self-pay | Admitting: Physician Assistant

## 2020-02-26 ENCOUNTER — Other Ambulatory Visit: Payer: Self-pay

## 2020-02-28 ENCOUNTER — Ambulatory Visit: Payer: Self-pay | Admitting: Physician Assistant

## 2020-03-13 ENCOUNTER — Other Ambulatory Visit: Payer: Self-pay | Admitting: Family Medicine

## 2020-03-13 NOTE — Telephone Encounter (Signed)
Plz schedule labs and cpe visits.  Then return message to me for refills.

## 2020-03-14 NOTE — Telephone Encounter (Signed)
Noted.  Name of Medication: Phentermine Name of Pharmacy: [facility] or Written Date and Quantity: 02/09/20, #30 Last Office Visit and Type: 12/13/19, gout Next Office Visit and Type: 05/15/20, CPE Last Controlled Substance Agreement Date: none Last UDS: none

## 2020-03-14 NOTE — Telephone Encounter (Signed)
Patient is scheduled EM 

## 2020-03-15 NOTE — Telephone Encounter (Signed)
ERx 

## 2020-04-05 HISTORY — PX: ORIF CLAVICLE FRACTURE: SUR924

## 2020-04-12 ENCOUNTER — Other Ambulatory Visit: Payer: Self-pay | Admitting: Family Medicine

## 2020-04-12 NOTE — Telephone Encounter (Signed)
Name of Medication: Phentermine Name of Pharmacy: [facility] or Written Date and Quantity: 03/15/20, #30 Last Office Visit and Type: 12/13/19, gout Next Office Visit and Type: 05/15/20, CPE Last Controlled Substance Agreement Date: none Last UDS: none

## 2020-04-12 NOTE — Telephone Encounter (Signed)
ERx 

## 2020-04-28 ENCOUNTER — Encounter: Payer: Self-pay | Admitting: Family Medicine

## 2020-05-15 ENCOUNTER — Encounter: Payer: Self-pay | Admitting: Family Medicine

## 2020-05-17 ENCOUNTER — Other Ambulatory Visit: Payer: Self-pay | Admitting: Family Medicine

## 2020-05-17 NOTE — Telephone Encounter (Signed)
Refill request Phentermine Last refill 04/12/20 #30 Last office visit 12/13/19 Upcoming appointment 07/24/20

## 2020-05-20 NOTE — Telephone Encounter (Signed)
ERx 

## 2020-05-31 ENCOUNTER — Encounter: Payer: Self-pay | Admitting: Family Medicine

## 2020-05-31 DIAGNOSIS — R42 Dizziness and giddiness: Secondary | ICD-10-CM

## 2020-05-31 DIAGNOSIS — R519 Headache, unspecified: Secondary | ICD-10-CM

## 2020-06-14 NOTE — Addendum Note (Signed)
Addended by: Ria Bush on: 06/14/2020 07:15 AM   Modules accepted: Orders

## 2020-06-14 NOTE — Telephone Encounter (Signed)
New referral placed to neurology locally

## 2020-06-18 ENCOUNTER — Other Ambulatory Visit: Payer: Self-pay | Admitting: Family Medicine

## 2020-06-19 NOTE — Telephone Encounter (Signed)
Name of Medication: Phentermine Name of Pharmacy: [facility] or Written Date and Quantity: 05/20/20, #30 Last Office Visit and Type: 12/13/19, gout and wt mgmt f/u Next Office Visit and Type: 07/24/20, CPE Last Controlled Substance Agreement Date: none Last UDS: none

## 2020-06-20 NOTE — Telephone Encounter (Signed)
ERx 

## 2020-06-23 ENCOUNTER — Other Ambulatory Visit: Payer: Self-pay | Admitting: Family Medicine

## 2020-07-11 ENCOUNTER — Ambulatory Visit (INDEPENDENT_AMBULATORY_CARE_PROVIDER_SITE_OTHER): Payer: No Typology Code available for payment source | Admitting: Neurology

## 2020-07-11 ENCOUNTER — Encounter: Payer: Self-pay | Admitting: Neurology

## 2020-07-11 DIAGNOSIS — S060X1S Concussion with loss of consciousness of 30 minutes or less, sequela: Secondary | ICD-10-CM

## 2020-07-11 DIAGNOSIS — R42 Dizziness and giddiness: Secondary | ICD-10-CM | POA: Insufficient documentation

## 2020-07-11 DIAGNOSIS — S060XAA Concussion with loss of consciousness status unknown, initial encounter: Secondary | ICD-10-CM

## 2020-07-11 DIAGNOSIS — F0781 Postconcussional syndrome: Secondary | ICD-10-CM | POA: Insufficient documentation

## 2020-07-11 DIAGNOSIS — S060X9A Concussion with loss of consciousness of unspecified duration, initial encounter: Secondary | ICD-10-CM | POA: Insufficient documentation

## 2020-07-11 HISTORY — DX: Concussion with loss of consciousness status unknown, initial encounter: S06.0XAA

## 2020-07-11 HISTORY — DX: Concussion with loss of consciousness of unspecified duration, initial encounter: S06.0X9A

## 2020-07-11 MED ORDER — NORTRIPTYLINE HCL 10 MG PO CAPS: ORAL_CAPSULE | ORAL | 3 refills | Status: DC
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
## 2020-07-24 ENCOUNTER — Other Ambulatory Visit: Payer: Self-pay | Admitting: Family Medicine

## 2020-07-24 ENCOUNTER — Other Ambulatory Visit: Payer: Self-pay

## 2020-07-24 ENCOUNTER — Encounter: Payer: Self-pay | Admitting: Family Medicine

## 2020-07-24 ENCOUNTER — Ambulatory Visit (INDEPENDENT_AMBULATORY_CARE_PROVIDER_SITE_OTHER): Payer: PRIVATE HEALTH INSURANCE | Admitting: Family Medicine

## 2020-07-24 VITALS — BP 142/80 | HR 77 | Temp 98.7°F | Ht 74.0 in | Wt 273.0 lb

## 2020-07-24 DIAGNOSIS — S42002A Fracture of unspecified part of left clavicle, initial encounter for closed fracture: Secondary | ICD-10-CM | POA: Insufficient documentation

## 2020-07-24 DIAGNOSIS — Z6835 Body mass index (BMI) 35.0-35.9, adult: Secondary | ICD-10-CM

## 2020-07-24 DIAGNOSIS — M1A09X0 Idiopathic chronic gout, multiple sites, without tophus (tophi): Secondary | ICD-10-CM | POA: Diagnosis not present

## 2020-07-24 DIAGNOSIS — I6502 Occlusion and stenosis of left vertebral artery: Secondary | ICD-10-CM

## 2020-07-24 DIAGNOSIS — S42002D Fracture of unspecified part of left clavicle, subsequent encounter for fracture with routine healing: Secondary | ICD-10-CM

## 2020-07-24 DIAGNOSIS — R972 Elevated prostate specific antigen [PSA]: Secondary | ICD-10-CM | POA: Diagnosis not present

## 2020-07-24 DIAGNOSIS — I10 Essential (primary) hypertension: Secondary | ICD-10-CM | POA: Diagnosis not present

## 2020-07-24 DIAGNOSIS — G4733 Obstructive sleep apnea (adult) (pediatric): Secondary | ICD-10-CM

## 2020-07-24 DIAGNOSIS — I700 Atherosclerosis of aorta: Secondary | ICD-10-CM

## 2020-07-24 DIAGNOSIS — I6523 Occlusion and stenosis of bilateral carotid arteries: Secondary | ICD-10-CM

## 2020-07-24 DIAGNOSIS — E785 Hyperlipidemia, unspecified: Secondary | ICD-10-CM | POA: Diagnosis not present

## 2020-07-24 DIAGNOSIS — Z0000 Encounter for general adult medical examination without abnormal findings: Secondary | ICD-10-CM | POA: Diagnosis not present

## 2020-07-24 DIAGNOSIS — J432 Centrilobular emphysema: Secondary | ICD-10-CM

## 2020-07-24 DIAGNOSIS — R7303 Prediabetes: Secondary | ICD-10-CM | POA: Diagnosis not present

## 2020-07-24 DIAGNOSIS — M159 Polyosteoarthritis, unspecified: Secondary | ICD-10-CM

## 2020-07-24 DIAGNOSIS — M8949 Other hypertrophic osteoarthropathy, multiple sites: Secondary | ICD-10-CM

## 2020-07-24 DIAGNOSIS — I771 Stricture of artery: Secondary | ICD-10-CM

## 2020-07-24 DIAGNOSIS — J439 Emphysema, unspecified: Secondary | ICD-10-CM

## 2020-07-24 DIAGNOSIS — Z87891 Personal history of nicotine dependence: Secondary | ICD-10-CM | POA: Diagnosis not present

## 2020-07-24 DIAGNOSIS — F0781 Postconcussional syndrome: Secondary | ICD-10-CM

## 2020-07-24 DIAGNOSIS — Z23 Encounter for immunization: Secondary | ICD-10-CM

## 2020-07-24 DIAGNOSIS — E782 Mixed hyperlipidemia: Secondary | ICD-10-CM

## 2020-07-24 DIAGNOSIS — M353 Polymyalgia rheumatica: Secondary | ICD-10-CM

## 2020-07-24 DIAGNOSIS — M109 Gout, unspecified: Secondary | ICD-10-CM

## 2020-07-24 DIAGNOSIS — Z9989 Dependence on other enabling machines and devices: Secondary | ICD-10-CM

## 2020-07-24 DIAGNOSIS — I6529 Occlusion and stenosis of unspecified carotid artery: Secondary | ICD-10-CM

## 2020-07-24 DIAGNOSIS — E039 Hypothyroidism, unspecified: Secondary | ICD-10-CM

## 2020-07-24 DIAGNOSIS — I6509 Occlusion and stenosis of unspecified vertebral artery: Secondary | ICD-10-CM

## 2020-07-24 MED ORDER — LEVOTHYROXINE SODIUM 100 MCG PO TABS: 100.0000 ug | ORAL_TABLET | Freq: Every day | ORAL | 3 refills | Status: DC

## 2020-07-24 MED ORDER — SAXENDA 18 MG/3ML ~~LOC~~ SOPN: PEN_INJECTOR | SUBCUTANEOUS | 0 refills | Status: AC

## 2020-07-24 MED ORDER — ATORVASTATIN CALCIUM 40 MG PO TABS: 40.0000 mg | ORAL_TABLET | Freq: Every day | ORAL | 3 refills | Status: DC
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
## 2020-07-24 NOTE — Assessment & Plan Note (Signed)
Update ESR 

## 2020-07-24 NOTE — Assessment & Plan Note (Signed)
Chronic, mildly elevated readings today despite losartan and hctz daily.

## 2020-07-24 NOTE — Assessment & Plan Note (Addendum)
Incidental finding on prior lung cancer screening CT.  Pt asxs off medication.

## 2020-07-24 NOTE — Assessment & Plan Note (Signed)
Continue statin. 

## 2020-07-24 NOTE — Assessment & Plan Note (Signed)
Continue ortho f/u. Pending clearance to start PT/OT.

## 2020-07-24 NOTE — Telephone Encounter (Signed)
Message from pharmacy:  [place]. WE HAVE TO DISPENSE 5 PENS AT 15ML EACH BOX AND THE COST IS $1500.

## 2020-07-24 NOTE — Assessment & Plan Note (Addendum)
Continue allopurinol 300mg  daily with PRN colchicine. No recent gout flare

## 2020-07-24 NOTE — Assessment & Plan Note (Signed)
Preventative protocols reviewed and updated unless pt declined. Discussed healthy diet and lifestyle.  

## 2020-07-24 NOTE — Assessment & Plan Note (Signed)
Encouraged healthy diet and lifestyle choices.  Discussed phentermine use.  Would like to try saxenda. Reviewed mechanism of action of GLP1 RA as well as common side effects and adverse events to watch for. No fmhx thyroid cancer, no personal h/o pancreatitis. Will send in for pt to price out in plcae of phentermine.

## 2020-07-24 NOTE — Assessment & Plan Note (Signed)
Update A1c ?

## 2020-07-24 NOTE — Assessment & Plan Note (Signed)
Saw neuro, started on nortriptyline - appreciate Dr Jannifer Franklin' care.

## 2020-07-24 NOTE — Assessment & Plan Note (Signed)
Continues BiPAP 7/14cmH2O regularly.

## 2020-07-24 NOTE — Assessment & Plan Note (Signed)
Refer back for lung cancer screening CT program.

## 2020-07-24 NOTE — Assessment & Plan Note (Signed)
Chronic, stable. Continue levothyroxine 157mcg daily. Update TSH.

## 2020-07-24 NOTE — Patient Instructions (Addendum)
Seccnd shingrix vaccine today  We will refer you back for lung cancer screening.  I do recommend COVID vaccine.  Labs today  Good to see you today Price out saxenda daily injection sent to pharmacy.   Health Maintenance, Male Adopting a healthy lifestyle and getting preventive care are important in promoting health and wellness. Ask your health care provider about: The right schedule for you to have regular tests and exams. Things you can do on your own to prevent diseases and keep yourself healthy. What should I know about diet, weight, and exercise? Eat a healthy diet  Eat a diet that includes plenty of vegetables, fruits, low-fat dairy products, and lean protein. Do not eat a lot of foods that are high in solid fats, added sugars, or sodium.  Maintain a healthy weight Body mass index (BMI) is a measurement that can be used to identify possible weight problems. It estimates body fat based on height and weight. Your health care provider can help determine your BMI and help you achieve or maintain ahealthy weight. Get regular exercise Get regular exercise. This is one of the most important things you can do for your health. Most adults should: Exercise for at least 150 minutes each week. The exercise should increase your heart rate and make you sweat (moderate-intensity exercise). Do strengthening exercises at least twice a week. This is in addition to the moderate-intensity exercise. Spend less time sitting. Even light physical activity can be beneficial. Watch cholesterol and blood lipids Have your blood tested for lipids and cholesterol at 64 years of age, then havethis test every 5 years. You may need to have your cholesterol levels checked more often if: Your lipid or cholesterol levels are high. You are older than 64 years of age. You are at high risk for heart disease. What should I know about cancer screening? Many types of cancers can be detected early and may often be  prevented. Depending on your health history and family history, you may need to have cancer screening at various ages. This may include screening for: Colorectal cancer. Prostate cancer. Skin cancer. Lung cancer. What should I know about heart disease, diabetes, and high blood pressure? Blood pressure and heart disease High blood pressure causes heart disease and increases the risk of stroke. This is more likely to develop in people who have high blood pressure readings, are of African descent, or are overweight. Talk with your health care provider about your target blood pressure readings. Have your blood pressure checked: Every 3-5 years if you are 53-48 years of age. Every year if you are 90 years old or older. If you are between the ages of 44 and 57 and are a current or former smoker, ask your health care provider if you should have a one-time screening for abdominal aortic aneurysm (AAA). Diabetes Have regular diabetes screenings. This checks your fasting blood sugar level. Have the screening done: Once every three years after age 24 if you are at a normal weight and have a low risk for diabetes. More often and at a younger age if you are overweight or have a high risk for diabetes. What should I know about preventing infection? Hepatitis B If you have a higher risk for hepatitis B, you should be screened for this virus. Talk with your health care provider to find out if you are at risk forhepatitis B infection. Hepatitis C Blood testing is recommended for: Everyone born from [birth year] through 1964. Anyone with known risk factors for  hepatitis C. Sexually transmitted infections (STIs) You should be screened each year for STIs, including gonorrhea and chlamydia, if: You are sexually active and are younger than 64 years of age. You are older than 64 years of age and your health care provider tells you that you are at risk for this type of infection. Your sexual activity has changed since  you were last screened, and you are at increased risk for chlamydia or gonorrhea. Ask your health care provider if you are at risk. Ask your health care provider about whether you are at high risk for HIV. Your health care provider may recommend a prescription medicine to help prevent HIV infection. If you choose to take medicine to prevent HIV, you should first get tested for HIV. You should then be tested every 3 months for as long as you are taking the medicine. Follow these instructions at home: Lifestyle Do not use any products that contain nicotine or tobacco, such as cigarettes, e-cigarettes, and chewing tobacco. If you need help quitting, ask your health care provider. Do not use street drugs. Do not share needles. Ask your health care provider for help if you need support or information about quitting drugs. Alcohol use Do not drink alcohol if your health care provider tells you not to drink. If you drink alcohol: Limit how much you have to 0-2 drinks a day. Be aware of how much alcohol is in your drink. In the [place] one drink equals one 12 oz bottle of beer (355 mL), one 5 oz glass of wine (148 mL), or one 1 oz glass of hard liquor (44 mL). General instructions Schedule regular health, dental, and eye exams. Stay current with your vaccines. Tell your health care provider if: You often feel depressed. You have ever been abused or do not feel safe at home. Summary Adopting a healthy lifestyle and getting preventive care are important in promoting health and wellness. Follow your health care provider's instructions about healthy diet, exercising, and getting tested or screened for diseases. Follow your health care provider's instructions on monitoring your cholesterol and blood pressure. This information is not intended to replace advice given to you by your health care provider. Make sure you discuss any questions you have with your healthcare provider. Document Revised: 12/15/2017  Document Reviewed: 12/15/2017 Elsevier Patient Education  2022 [employer].

## 2020-07-24 NOTE — Assessment & Plan Note (Signed)
Chronic, stable. Continue atorvastatin 40mg  daily. Update FLP today.  The 10-year ASCVD risk score [name] DC Brooke Bonito., et al., 2013) is: 16.1%   Values used to calculate the score:     Age: 65 years     Sex: Male     Is Non-Hispanic African American: No     Diabetic: No     Tobacco smoker: No     Systolic Blood Pressure: 903 mmHg     Is BP treated: Yes     HDL Cholesterol: 52.8 mg/dL     Total Cholesterol: 205 mg/dL

## 2020-07-24 NOTE — Assessment & Plan Note (Signed)
Has seen urology, overdue for f/u. Will update PSA.

## 2020-07-24 NOTE — Assessment & Plan Note (Signed)
Mild. Consider updated carotids next visit.

## 2020-07-25 LAB — COMPREHENSIVE METABOLIC PANEL
ALT: 23 U/L (ref 0–53)
AST: 24 U/L (ref 0–37)
Albumin: 4.7 g/dL (ref 3.5–5.2)
Alkaline Phosphatase: 74 U/L (ref 39–117)
BUN: 19 mg/dL (ref 6–23)
CO2: 27 mEq/L (ref 19–32)
Calcium: 9.8 mg/dL (ref 8.4–10.5)
Chloride: 100 mEq/L (ref 96–112)
Creatinine, Ser: 1.08 mg/dL (ref 0.40–1.50)
GFR: 72.7 mL/min (ref >60.00)
Glucose, Bld: 100 mg/dL — ABNORMAL HIGH (ref 70–99)
Potassium: 3.8 mEq/L (ref 3.5–5.1)
Sodium: 139 mEq/L (ref 135–145)
Total Bilirubin: 0.6 mg/dL (ref 0.2–1.2)
Total Protein: 7 g/dL (ref 6.0–8.3)

## 2020-07-25 LAB — CBC WITH DIFFERENTIAL/PLATELET
Basophils Absolute: 0 10*3/uL (ref 0.0–0.1)
Basophils Relative: 0.7 % (ref 0.0–3.0)
Eosinophils Absolute: 0.3 10*3/uL (ref 0.0–0.7)
Eosinophils Relative: 4.4 % (ref 0.0–5.0)
HCT: 39 % (ref 39.0–52.0)
Hemoglobin: 13.2 g/dL (ref 13.0–17.0)
Lymphocytes Relative: 27.2 % (ref 12.0–46.0)
Lymphs Abs: 1.6 10*3/uL (ref 0.7–4.0)
MCHC: 33.9 g/dL (ref 30.0–36.0)
MCV: 93.7 fl (ref 78.0–100.0)
Monocytes Absolute: 0.4 10*3/uL (ref 0.1–1.0)
Monocytes Relative: 7.5 % (ref 3.0–12.0)
Neutro Abs: 3.6 10*3/uL (ref 1.4–7.7)
Neutrophils Relative %: 60.2 % (ref 43.0–77.0)
Platelets: 226 10*3/uL (ref 150.0–400.0)
RBC: 4.16 Mil/uL — ABNORMAL LOW (ref 4.22–5.81)
RDW: 13.9 % (ref 11.5–15.5)
WBC: 6 10*3/uL (ref 4.0–10.5)

## 2020-07-25 LAB — LIPID PANEL
Cholesterol: 217 mg/dL — ABNORMAL HIGH (ref 0–200)
HDL: 48.8 mg/dL (ref >39.00)
Total CHOL/HDL Ratio: 4
Triglycerides: 411 mg/dL — ABNORMAL HIGH (ref 0.0–149.0)

## 2020-07-25 LAB — LDL CHOLESTEROL, DIRECT: Direct LDL: 114 mg/dL

## 2020-07-25 LAB — TSH: TSH: 3.87 u[IU]/mL (ref 0.35–5.50)

## 2020-07-25 LAB — HEMOGLOBIN A1C: Hgb A1c MFr Bld: 6.1 % (ref 4.6–6.5)

## 2020-07-25 LAB — SEDIMENTATION RATE: Sed Rate: 10 mm/hr (ref 0–20)

## 2020-07-25 LAB — PSA: PSA: 2.53 ng/mL (ref 0.10–4.00)

## 2020-07-29 ENCOUNTER — Encounter: Payer: Self-pay | Admitting: Family Medicine

## 2020-08-04 ENCOUNTER — Other Ambulatory Visit: Payer: Self-pay | Admitting: Neurology

## 2020-09-25 ENCOUNTER — Encounter: Payer: Self-pay | Admitting: Family Medicine

## 2020-09-28 MED ORDER — PHENTERMINE HCL 30 MG PO CAPS: ORAL_CAPSULE | ORAL | 0 refills | Status: DC
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
## 2020-10-24 ENCOUNTER — Encounter: Payer: Self-pay | Admitting: Family Medicine

## 2020-10-24 ENCOUNTER — Ambulatory Visit: Payer: 59 | Admitting: Family Medicine

## 2020-10-24 DIAGNOSIS — S060X1S Concussion with loss of consciousness of 30 minutes or less, sequela: Secondary | ICD-10-CM

## 2020-10-25 ENCOUNTER — Other Ambulatory Visit: Payer: Self-pay | Admitting: Family Medicine

## 2020-10-25 NOTE — Telephone Encounter (Signed)
ERx Appt scheduled for 11/9

## 2020-10-25 NOTE — Telephone Encounter (Signed)
Name of Medication: Phentermine Name of Pharmacy: [facility], [place] or Written Date and Quantity: 09/28/20, #30 Last Office Visit and Type: 07/24/20, CPE Next Office Visit and Type: 11/13/20, wt mgmt/HTN f/u Last Controlled Substance Agreement Date: none Last UDS: none

## 2020-11-13 ENCOUNTER — Ambulatory Visit: Payer: 59 | Admitting: Family Medicine

## 2020-11-26 ENCOUNTER — Other Ambulatory Visit: Payer: Self-pay

## 2020-11-26 ENCOUNTER — Ambulatory Visit (INDEPENDENT_AMBULATORY_CARE_PROVIDER_SITE_OTHER): Payer: PRIVATE HEALTH INSURANCE | Admitting: Family Medicine

## 2020-11-26 ENCOUNTER — Encounter: Payer: Self-pay | Admitting: Family Medicine

## 2020-11-26 VITALS — BP 150/70 | HR 75 | Temp 97.5°F | Ht 74.0 in | Wt 264.1 lb

## 2020-11-26 DIAGNOSIS — Z23 Encounter for immunization: Secondary | ICD-10-CM

## 2020-11-26 DIAGNOSIS — E782 Mixed hyperlipidemia: Secondary | ICD-10-CM

## 2020-11-26 DIAGNOSIS — I10 Essential (primary) hypertension: Secondary | ICD-10-CM

## 2020-11-26 DIAGNOSIS — E039 Hypothyroidism, unspecified: Secondary | ICD-10-CM

## 2020-11-26 MED ORDER — PHENTERMINE HCL 30 MG PO CAPS: 30.0000 mg | ORAL_CAPSULE | ORAL | 0 refills | Status: DC

## 2020-11-26 MED ORDER — LOSARTAN POTASSIUM-HCTZ 100-25 MG PO TABS: 1.0000 | ORAL_TABLET | Freq: Every day | ORAL | 3 refills | Status: DC

## 2020-11-26 NOTE — Assessment & Plan Note (Signed)
Update TSH/fT4 when he returns for labs.

## 2020-11-26 NOTE — Assessment & Plan Note (Signed)
~  10lb weight loss since starting phentermine, tolerating well. Will need to closely monitor blood pressures - he will buy cuff. Otherwise tolerating phentermine well - will continue at this time. Started 09/2020.

## 2020-11-26 NOTE — Patient Instructions (Addendum)
Flu shot today  Start checking blood pressures at home. Buy automatic arm cuff to have at home.  Check up front about scheduling fasting labs tomorrow morning at [facility].  Combo pill (losartan / hydrochlorothiazide) sent to pharmacy.  We will continue phentermine.
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
## 2020-11-26 NOTE — Assessment & Plan Note (Addendum)
Update FLP tomorrow on daily atorvastatin  The 10-year ASCVD risk score (Arnett DK, et al., 2019) is: 19.1%   Values used to calculate the score:     Age: 65 years     Sex: Male     Is Non-Hispanic African American: No     Diabetic: No     Tobacco smoker: No     Systolic Blood Pressure: 709 mmHg     Is BP treated: Yes     HDL Cholesterol: 48.8 mg/dL     Total Cholesterol: 217 mg/dL d

## 2020-11-26 NOTE — Assessment & Plan Note (Addendum)
Chronic, deteriorated despite regularly taking losartan and hctz. Will change to combo pill, recommend he start monitoring BP closely at home and let me know if consistently elevated to add medication. Notes weight loss helps control blood pressures.

## 2020-11-27 ENCOUNTER — Other Ambulatory Visit: Payer: 59

## 2020-12-06 ENCOUNTER — Encounter: Payer: Self-pay | Admitting: Family Medicine

## 2020-12-09 ENCOUNTER — Other Ambulatory Visit: Payer: Self-pay | Admitting: *Deleted

## 2020-12-09 DIAGNOSIS — Z87891 Personal history of nicotine dependence: Secondary | ICD-10-CM

## 2020-12-10 MED ORDER — AMLODIPINE BESYLATE 5 MG PO TABS: 5.0000 mg | ORAL_TABLET | Freq: Every day | ORAL | 3 refills | Status: DC

## 2020-12-18 ENCOUNTER — Other Ambulatory Visit: Payer: 59

## 2020-12-20 ENCOUNTER — Other Ambulatory Visit: Payer: Self-pay

## 2020-12-20 ENCOUNTER — Ambulatory Visit
Admission: RE | Admit: 2020-12-20 | Discharge: 2020-12-20 | Disposition: A | Discharge status: Home / Self Care | Payer: Self-pay | Source: Ambulatory Visit | Attending: Acute Care | Admitting: Acute Care

## 2020-12-20 DIAGNOSIS — Z87891 Personal history of nicotine dependence: Secondary | ICD-10-CM | POA: Insufficient documentation

## 2020-12-27 ENCOUNTER — Other Ambulatory Visit: Payer: Self-pay | Admitting: Acute Care

## 2020-12-27 DIAGNOSIS — Z87891 Personal history of nicotine dependence: Secondary | ICD-10-CM

## 2020-12-30 ENCOUNTER — Encounter: Payer: Self-pay | Admitting: Family Medicine

## 2021-01-09 ENCOUNTER — Other Ambulatory Visit: Payer: Self-pay | Admitting: Family Medicine

## 2021-01-09 NOTE — Telephone Encounter (Signed)
Last refill 11/26/2020 #30

## 2021-01-10 NOTE — Telephone Encounter (Signed)
ERx 

## 2021-01-28 ENCOUNTER — Other Ambulatory Visit: Payer: Self-pay

## 2021-01-28 ENCOUNTER — Ambulatory Visit (INDEPENDENT_AMBULATORY_CARE_PROVIDER_SITE_OTHER): Payer: PRIVATE HEALTH INSURANCE | Admitting: Family Medicine

## 2021-01-28 ENCOUNTER — Encounter: Payer: Self-pay | Admitting: Family Medicine

## 2021-01-28 DIAGNOSIS — J0100 Acute maxillary sinusitis, unspecified: Secondary | ICD-10-CM | POA: Diagnosis not present

## 2021-01-28 DIAGNOSIS — J019 Acute sinusitis, unspecified: Secondary | ICD-10-CM | POA: Insufficient documentation

## 2021-01-28 MED ORDER — AMOXICILLIN-POT CLAVULANATE 875-125 MG PO TABS: 1.0000 | ORAL_TABLET | Freq: Two times a day (BID) | ORAL | 0 refills | Status: DC

## 2021-01-28 NOTE — Progress Notes (Signed)
Subjective:    Patient ID: Robert Shea, male    DOB: [birth year], 66 y.o.   MRN: [id number]  This visit occurred during the SARS-CoV-2 public health emergency.  Safety protocols were in place, including screening questions prior to the visit, additional usage of staff PPE, and extensive cleaning of exam room while observing appropriate contact time as indicated for disinfecting solutions.   HPI 66 yo pt of Dr Darnell Level presents with nasal congestion and headache and sinus pain  Wt Readings from Last 3 Encounters:  01/28/21 270 lb 6 oz (122.6 kg)  12/20/20 258 lb (117 kg)  11/26/20 264 lb 1 oz (119.8 kg)   34.71 kg/m  Symptoms for over 2 weeks  Started with cold symptoms - tested negative for covid Got some better but now worse again Lot of mucous /congestion -especially in ams Facial pressure -now pain and headache  Mucous is yellow /green  Sneezing Coughing - mucous from throat but not from chest  No wheezing  No sob  Ears itch and feel full  Throat is dry in am/not bad  Non smoker   No fever (perhaps low grade first few days)   No GI symptoms   Otc: Day quil - helps  No nasal sprays   Used nasal saline in remote past   Patient Active Problem List   Diagnosis Date Noted   Acute sinusitis 01/28/2021   Closed fracture of left clavicle 07/24/2020   Vertigo 07/11/2020   Post concussion syndrome 07/11/2020   Stenosis of left subclavian artery ([place]) 03/17/2019   Vertebral artery stenosis, left 48/54/6270   Systolic murmur 35/00/9381   Increased prostate specific antigen (PSA) velocity 12/21/2017   Aortic atherosclerosis ([place]) 09/08/2017   Emphysema of lung ([place]) 09/08/2017   Rheumatoid factor positive 10/06/2016   Lesion of lip 12/25/2015   High risk medication use 10/28/2015   Osteoarthritis of both hands 10/28/2015   Osteoarthritis of multiple joints 10/28/2015   OSA on CPAP 10/25/2015   Thoracic back pain 06/11/2015   PMR (polymyalgia rheumatica) ([place])  01/30/2014   Low back pain radiating to right lower extremity 01/01/2014   Erectile dysfunction 09/19/2013   Osteoarthritis of feet, bilateral 07/26/2013   Prediabetes 03/25/2013   Severe obesity (BMI 35.0-39.9) with comorbidity [facility])    Health care maintenance 03/15/2013   Carotid stenosis 03/15/2013   Gout    HTN (hypertension)    HLD (hyperlipidemia)    Hypothyroidism    History of colon polyps    Ex-smoker    Past Medical History:  Diagnosis Date   Concussion 07/11/2020   Ex-smoker 2009   quit   Gout    Heart murmur longstanding   History of chicken pox    History of colon polyps 2010   benign   History of measles    HLD (hyperlipidemia)    HTN (hypertension) 2010   Hypothyroidism 2010   Obesity    PMR (polymyalgia rheumatica) ([place]) 01/30/2014   Past Surgical History:  Procedure Laterality Date   COLONOSCOPY  07/2009   2 adenomas rpt 5 yrs Collie Siad)   COLONOSCOPY  05/2013   1 hyperplastic polyp, rpt 10 yrs Ardis Hughs)   VASECTOMY  1995   Social History   Tobacco Use   Smoking status: Former    Packs/day: 1.00    Years: 34.00    Pack years: 34.00    Types: Cigarettes    Quit date: 01/06/2007    Years since quitting: 14.0   Smokeless tobacco:  Never  Vaping Use   Vaping Use: Never used  Substance Use Topics   Alcohol use: Yes    Comment: occassionally   Drug use: No   Family History  Problem Relation Age of Onset   Cancer Father 104       bone?   Cancer Maternal Uncle        throat, smoker   Diabetes Mother    Diabetes Brother    CAD Paternal Grandfather 66       MI   Stroke Paternal Grandfather    CAD Brother 40       widowmaker   Diabetes Brother    Healthy Son    Colon cancer Neg Hx    Pancreatic cancer Neg Hx    Stomach cancer Neg Hx    Rectal cancer Neg Hx    Allergies  Allergen Reactions   Uloric [Febuxostat]    Current Outpatient Medications on File Prior to Visit  Medication Sig Dispense Refill   allopurinol (ZYLOPRIM) 300 MG tablet TAKE 1  TABLET BY MOUTH EVERY DAY 90 tablet 3   amLODipine (NORVASC) 5 MG tablet Take 1 tablet (5 mg total) by mouth daily. 30 tablet 3   atorvastatin (LIPITOR) 40 MG tablet Take 1 tablet (40 mg total) by mouth daily. 90 tablet 3   levothyroxine (SYNTHROID) 100 MCG tablet Take 1 tablet (100 mcg total) by mouth daily before breakfast. 90 tablet 3   losartan-hydrochlorothiazide (HYZAAR) 100-25 MG tablet Take 1 tablet by mouth daily. 90 tablet 3   phentermine 30 MG capsule TAKE 1 CAPSULE BY MOUTH EVERY DAY IN THE MORNING 30 capsule 0   sildenafil (VIAGRA) 100 MG tablet TAKE 1/2 TO 1 TABLET BY MOUTH DAILY AS NEEDED 10 tablet 3   No current facility-administered medications on file prior to visit.     Review of Systems  Constitutional:  Positive for appetite change. Negative for fatigue and fever.  HENT:  Positive for congestion, postnasal drip, rhinorrhea, sinus pressure and sore throat. Negative for ear pain and nosebleeds.   Eyes:  Negative for pain, redness and itching.  Respiratory:  Positive for cough. Negative for shortness of breath and wheezing.   Cardiovascular:  Negative for chest pain.  Gastrointestinal:  Negative for abdominal pain, diarrhea, nausea and vomiting.  Endocrine: Negative for polyuria.  Genitourinary:  Negative for dysuria, frequency and urgency.  Musculoskeletal:  Negative for arthralgias and myalgias.  Allergic/Immunologic: Negative for immunocompromised state.  Neurological:  Positive for headaches. Negative for dizziness, tremors, syncope, weakness and numbness.  Hematological:  Negative for adenopathy. Does not bruise/bleed easily.  Psychiatric/Behavioral:  Negative for dysphoric mood. The patient is not nervous/anxious.       Objective:   Physical Exam Constitutional:      General: He is not in acute distress.    Appearance: He is well-developed. He is obese. He is not ill-appearing or diaphoretic.  HENT:     Head: Normocephalic and atraumatic.     Comments: No  sinus tenderness    Right Ear: Tympanic membrane, ear canal and external ear normal.     Left Ear: Tympanic membrane, ear canal and external ear normal.     Nose: Congestion and rhinorrhea present.     Mouth/Throat:     Mouth: Mucous membranes are moist.     Pharynx: No oropharyngeal exudate.  Eyes:     General:        Right eye: No discharge.  Left eye: No discharge.     Conjunctiva/sclera: Conjunctivae normal.     Pupils: Pupils are equal, round, and reactive to light.  Cardiovascular:     Rate and Rhythm: Normal rate and regular rhythm.     Heart sounds: Murmur heard.  Pulmonary:     Effort: Pulmonary effort is normal. No respiratory distress.     Breath sounds: Normal breath sounds. No wheezing or rales.     Comments: Good air exch No wheeze even on forced expiration  Chest:     Chest wall: No tenderness.  Musculoskeletal:        General: Normal range of motion.     Cervical back: Normal range of motion and neck supple.  Lymphadenopathy:     Cervical: No cervical adenopathy.  Skin:    General: Skin is warm and dry.     Findings: No erythema or rash.  Neurological:     Mental Status: He is alert.     Cranial Nerves: No cranial nerve deficit.  Psychiatric:        Mood and Affect: Mood normal.          Assessment & Plan:   Problem List Items Addressed This Visit       Respiratory   Acute sinusitis    2 weeks s/p uri  Purulent nasal mucous/sinus pain and congestion with malaise and cough  Reassuring exam  Discussed sinus irrigations and symptom management  Px augmentin for 7 d course ER precautions discussed Update if not starting to improve in a week or if worsening   Handout given      Relevant Medications   amoxicillin-clavulanate (AUGMENTIN) 875-125 MG tablet

## 2021-01-28 NOTE — Assessment & Plan Note (Signed)
2 weeks s/p uri  Purulent nasal mucous/sinus pain and congestion with malaise and cough  Reassuring exam  Discussed sinus irrigations and symptom management  Px augmentin for 7 d course ER precautions discussed Update if not starting to improve in a week or if worsening   Handout given

## 2021-01-28 NOTE — Patient Instructions (Signed)
Drink lots of fluids  Try some nasal saline spray  If still congested- can try flonase over the counter for 2 weeks  Breathe steam  Take augmentin as directed   Update if not starting to improve in a week or if worsening

## 2021-02-06 ENCOUNTER — Other Ambulatory Visit: Payer: Self-pay | Admitting: Family Medicine

## 2021-02-11 ENCOUNTER — Other Ambulatory Visit: Payer: Self-pay | Admitting: Family Medicine

## 2021-02-11 NOTE — Telephone Encounter (Signed)
Duplicate request

## 2021-02-12 MED ORDER — PHENTERMINE HCL 30 MG PO CAPS: 30.0000 mg | ORAL_CAPSULE | ORAL | 0 refills | Status: DC

## 2021-02-12 NOTE — Telephone Encounter (Signed)
ERx . I never received initial request.  Phentermine started 09/2020

## 2021-02-17 NOTE — Telephone Encounter (Signed)
Duplicate request

## 2021-03-19 ENCOUNTER — Other Ambulatory Visit: Payer: Self-pay | Admitting: Family Medicine

## 2021-03-19 NOTE — Telephone Encounter (Signed)
ERx ?plz schedule weight management f/u visit  ?

## 2021-03-19 NOTE — Telephone Encounter (Signed)
Refill request Phentermine ?Last refill 02/12/21 #30 ?Last office visit 01/28/21 acute ?Last office visit with PCP 07/24/20 ?No upcoming appointment scheduled ?

## 2021-04-05 HISTORY — PX: ORIF CLAVICLE FRACTURE: SUR924

## 2021-04-07 ENCOUNTER — Other Ambulatory Visit: Payer: Self-pay | Admitting: Family Medicine

## 2021-04-09 NOTE — Telephone Encounter (Signed)
Refill request Amlodipine ?Last office visit 11/26/20 no showed for follow-up lab appointments ?Last refill 12/10/20 #30/3 ?

## 2021-05-12 ENCOUNTER — Other Ambulatory Visit: Payer: Self-pay | Admitting: Family Medicine

## 2021-05-13 NOTE — Telephone Encounter (Signed)
Name of Medication: Phentermine ?Name of Pharmacy: [facility] ?Last Fill or Written Date and Quantity: 03/19/21, #30 ?Last Office Visit and Type: 11/26/20, wt/HTN f/u ?Next Office Visit and Type: none ?Last Controlled Substance Agreement Date: none ?Last UDS: none ? ? ?

## 2021-05-14 NOTE — Telephone Encounter (Signed)
I will call 05/15/21 ?

## 2021-05-15 NOTE — Telephone Encounter (Signed)
Lvm for pt to call back and schedule.  

## 2021-05-15 NOTE — Telephone Encounter (Signed)
Need update on BP readings prior to represcribing medication  ?

## 2021-05-27 ENCOUNTER — Encounter: Payer: Self-pay | Admitting: Family Medicine

## 2021-05-27 ENCOUNTER — Other Ambulatory Visit: Payer: Self-pay | Admitting: Family Medicine

## 2021-05-27 ENCOUNTER — Ambulatory Visit (INDEPENDENT_AMBULATORY_CARE_PROVIDER_SITE_OTHER): Payer: PRIVATE HEALTH INSURANCE | Admitting: Family Medicine

## 2021-05-27 VITALS — BP 148/72 | HR 71 | Temp 97.9°F | Ht 74.0 in | Wt 276.1 lb

## 2021-05-27 DIAGNOSIS — I10 Essential (primary) hypertension: Secondary | ICD-10-CM | POA: Diagnosis not present

## 2021-05-27 DIAGNOSIS — S42002K Fracture of unspecified part of left clavicle, subsequent encounter for fracture with nonunion: Secondary | ICD-10-CM | POA: Diagnosis not present

## 2021-05-27 MED ORDER — CONTRAVE 8-90 MG PO TB12: ORAL_TABLET | ORAL | 0 refills | Status: DC

## 2021-05-27 NOTE — Assessment & Plan Note (Signed)
S/p ORIF 04/2020 S/p redo ORIF 04/2021
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
## 2021-05-27 NOTE — Patient Instructions (Addendum)
Look into [employer] program.  Price out [employer] - 1 pill daily for 1 week then increase to twice daily for 1 week then 2 in am and 1 in pm for 1 week then 2 twice daily (full dose). Let me know if unaffordable.  BP staying too high. Back off salt/sodium in diet. Ensure good water intake. Ensure good fruits/vegetables in the diet. All this will help lower blood pressure. Continue monitoring blood pressures and let me know if consistently >140/90 to discuss increase in amlodipine to '10mg'$  daily.  Return in 3 months for welcome to medicare visit.

## 2021-05-27 NOTE — Assessment & Plan Note (Signed)
Chronic, above goal. He does attribute to dietary liberties, high sodium intake. Encouraged limiting salt intake, will reassess at CPE in 3 months. Continue losartan, hctz, amlodipine '5mg'$  daily.

## 2021-05-27 NOTE — Assessment & Plan Note (Addendum)
Continue to encourage healthy diet and lifestyle choices. Activity currently limited while he's recovering from redo ORIF of left clavicle (after first non union).  Hesitant to restart phentermine given recent BP elevation.  Will price out contrave sent to local pharmacy, update with affordability. Reviewed mechanism of action of combo bariatric medication. Also discussed looking into [employer] subscription app weight loss program.

## 2021-05-29 ENCOUNTER — Telehealth: Payer: Self-pay | Admitting: Family Medicine

## 2021-05-29 NOTE — Telephone Encounter (Signed)
Can we do PA for contrave?  Phentermine contraindicated due to HTN

## 2021-05-30 ENCOUNTER — Encounter: Payer: Self-pay | Admitting: Family Medicine

## 2021-05-30 NOTE — Telephone Encounter (Signed)
Prior auth started for Contrave 8-'90MG'$  er tablets. [facility] Payment ([employer]) Waiting for determination (will be faxed).

## 2021-06-03 NOTE — Telephone Encounter (Signed)
See my chart message

## 2021-06-05 ENCOUNTER — Other Ambulatory Visit: Payer: Self-pay | Admitting: Family Medicine

## 2021-06-06 NOTE — Telephone Encounter (Signed)
Refill Phentermine Last office visit 05/27/21 Last refill 03/19/21 #30

## 2021-06-09 NOTE — Telephone Encounter (Signed)
ERx 

## 2021-07-02 ENCOUNTER — Other Ambulatory Visit: Payer: Self-pay | Admitting: Family Medicine

## 2021-07-09 ENCOUNTER — Other Ambulatory Visit: Payer: Self-pay | Admitting: Family Medicine

## 2021-07-09 NOTE — Telephone Encounter (Signed)
Refill request Phentermine Last refill 06/09/21 #30 Last office visit 05/27/21

## 2021-07-10 NOTE — Telephone Encounter (Signed)
Spoke with Robert Shea relaying Dr. Synthia Innocent message.  Robert Shea verbalizes understanding scheduling wt mgmt f/u on 07/28/21 at 2:30.  Robert Shea states he will send recent BP readings, which have been <150/100, via MyChart.

## 2021-07-10 NOTE — Telephone Encounter (Signed)
ERx Pt will need OV prior to more refills.  Plz call to get update on recent BP readings and if any >150/100, rec not take phentermine.

## 2021-07-28 ENCOUNTER — Ambulatory Visit: Payer: Medicare Other | Admitting: Family Medicine

## 2021-07-28 ENCOUNTER — Telehealth: Payer: Self-pay

## 2021-07-28 NOTE — Telephone Encounter (Signed)
[place] Night - Client Nonclinical Telephone Record  [facility] Client [place] Night - Client Client Site [place] Provider Ria Bush - MD Contact Type Call Who Is Calling Patient / Member / Family / Caregiver Caller Name [place] Phone Number [phone] Patient Name Robert Shea Patient DOB January 04, 1956 Call Type Message Only Information Provided Reason for Call Request to [place] Appointment Initial Comment Caller states he needs to cancel his 2p appointment. Patient request to speak to RN No Additional Comment Please call. Disp. Time Disposition Final User 07/28/2021 6:04:26 AM General Information Provided Yes Clydene Laming, Amy Call Closed By: [place] Transaction Date/Time: 07/28/2021 6:03:04 AM (ET  I cancelled the 07/28/21 at 2:30 pm appt for wt mgt fu as requested above. Unable to speak with pt or pts wife and left v/m requesting pt to cb to confirm wanted the 07/28/21 at 2:30 appt cancelled. Pt does not have another appt scheduled. Sending note to [facility] support and Lattie Haw CMA.

## 2021-07-28 NOTE — Telephone Encounter (Signed)
Fyi to Dr. G 

## 2021-07-28 NOTE — Telephone Encounter (Signed)
Noted  

## 2021-08-01 ENCOUNTER — Other Ambulatory Visit: Payer: Self-pay | Admitting: Family Medicine

## 2021-08-01 NOTE — Telephone Encounter (Signed)
E-scribed refills.  Plz schedule [facility] exam.

## 2021-08-04 NOTE — Telephone Encounter (Signed)
LVM for patient to call and schedule

## 2021-08-04 NOTE — Telephone Encounter (Signed)
Noted  

## 2021-08-27 ENCOUNTER — Other Ambulatory Visit: Payer: Self-pay | Admitting: Family Medicine

## 2021-08-27 NOTE — Telephone Encounter (Signed)
Please call patient and schedule a physical. Refill sent to pharmacy.

## 2021-08-27 NOTE — Telephone Encounter (Signed)
Noted  

## 2021-08-27 NOTE — Telephone Encounter (Signed)
Patient scheduled.

## 2021-10-01 ENCOUNTER — Other Ambulatory Visit: Payer: Self-pay | Admitting: Family Medicine

## 2021-10-05 ENCOUNTER — Other Ambulatory Visit: Payer: Self-pay | Admitting: Family Medicine

## 2021-10-05 DIAGNOSIS — E782 Mixed hyperlipidemia: Secondary | ICD-10-CM

## 2021-12-05 ENCOUNTER — Other Ambulatory Visit: Payer: Self-pay | Admitting: Family Medicine

## 2021-12-05 DIAGNOSIS — I10 Essential (primary) hypertension: Secondary | ICD-10-CM

## 2021-12-09 ENCOUNTER — Encounter: Payer: Self-pay | Admitting: Family Medicine

## 2021-12-09 ENCOUNTER — Ambulatory Visit (INDEPENDENT_AMBULATORY_CARE_PROVIDER_SITE_OTHER): Payer: Medicare Other | Admitting: Family Medicine

## 2021-12-09 VITALS — BP 136/68 | HR 69 | Temp 97.1°F | Ht 73.5 in | Wt 280.8 lb

## 2021-12-09 DIAGNOSIS — Z7189 Other specified counseling: Secondary | ICD-10-CM | POA: Diagnosis not present

## 2021-12-09 DIAGNOSIS — G4733 Obstructive sleep apnea (adult) (pediatric): Secondary | ICD-10-CM

## 2021-12-09 DIAGNOSIS — M1A09X0 Idiopathic chronic gout, multiple sites, without tophus (tophi): Secondary | ICD-10-CM | POA: Diagnosis not present

## 2021-12-09 DIAGNOSIS — R7303 Prediabetes: Secondary | ICD-10-CM

## 2021-12-09 DIAGNOSIS — M353 Polymyalgia rheumatica: Secondary | ICD-10-CM | POA: Diagnosis not present

## 2021-12-09 DIAGNOSIS — I700 Atherosclerosis of aorta: Secondary | ICD-10-CM

## 2021-12-09 DIAGNOSIS — E782 Mixed hyperlipidemia: Secondary | ICD-10-CM | POA: Diagnosis not present

## 2021-12-09 DIAGNOSIS — J432 Centrilobular emphysema: Secondary | ICD-10-CM

## 2021-12-09 DIAGNOSIS — I771 Stricture of artery: Secondary | ICD-10-CM

## 2021-12-09 DIAGNOSIS — R972 Elevated prostate specific antigen [PSA]: Secondary | ICD-10-CM

## 2021-12-09 DIAGNOSIS — Z0000 Encounter for general adult medical examination without abnormal findings: Secondary | ICD-10-CM

## 2021-12-09 DIAGNOSIS — E039 Hypothyroidism, unspecified: Secondary | ICD-10-CM | POA: Diagnosis not present

## 2021-12-09 DIAGNOSIS — I10 Essential (primary) hypertension: Secondary | ICD-10-CM | POA: Diagnosis not present

## 2021-12-09 LAB — COMPREHENSIVE METABOLIC PANEL
ALT: 26 U/L (ref 0–53)
AST: 22 U/L (ref 0–37)
Albumin: 5.1 g/dL (ref 3.5–5.2)
Alkaline Phosphatase: 61 U/L (ref 39–117)
BUN: 19 mg/dL (ref 6–23)
CO2: 27 mEq/L (ref 19–32)
Calcium: 9.7 mg/dL (ref 8.4–10.5)
Chloride: 101 mEq/L (ref 96–112)
Creatinine, Ser: 1.08 mg/dL (ref 0.40–1.50)
GFR: 72.01 mL/min (ref >60.00)
Glucose, Bld: 139 mg/dL — ABNORMAL HIGH (ref 70–99)
Potassium: 3.7 mEq/L (ref 3.5–5.1)
Sodium: 139 mEq/L (ref 135–145)
Total Bilirubin: 1 mg/dL (ref 0.2–1.2)
Total Protein: 7.3 g/dL (ref 6.0–8.3)

## 2021-12-09 LAB — CBC WITH DIFFERENTIAL/PLATELET
Basophils Absolute: 0 10*3/uL (ref 0.0–0.1)
Basophils Relative: 0.8 % (ref 0.0–3.0)
Eosinophils Absolute: 0.2 10*3/uL (ref 0.0–0.7)
Eosinophils Relative: 3.2 % (ref 0.0–5.0)
HCT: 41 % (ref 39.0–52.0)
Hemoglobin: 14.2 g/dL (ref 13.0–17.0)
Lymphocytes Relative: 31.6 % (ref 12.0–46.0)
Lymphs Abs: 1.7 10*3/uL (ref 0.7–4.0)
MCHC: 34.8 g/dL (ref 30.0–36.0)
MCV: 94.3 fl (ref 78.0–100.0)
Monocytes Absolute: 0.5 10*3/uL (ref 0.1–1.0)
Monocytes Relative: 8.6 % (ref 3.0–12.0)
Neutro Abs: 3.1 10*3/uL (ref 1.4–7.7)
Neutrophils Relative %: 55.8 % (ref 43.0–77.0)
Platelets: 229 10*3/uL (ref 150.0–400.0)
RBC: 4.35 Mil/uL (ref 4.22–5.81)
RDW: 13.1 % (ref 11.5–15.5)
WBC: 5.5 10*3/uL (ref 4.0–10.5)

## 2021-12-09 LAB — LIPID PANEL
Cholesterol: 164 mg/dL (ref 0–200)
HDL: 43.7 mg/dL (ref >39.00)
LDL Cholesterol: 81 mg/dL (ref 0–99)
NonHDL: 120.09
Total CHOL/HDL Ratio: 4
Triglycerides: 194 mg/dL — ABNORMAL HIGH (ref 0.0–149.0)
VLDL: 38.8 mg/dL (ref 0.0–40.0)

## 2021-12-09 LAB — SEDIMENTATION RATE: Sed Rate: 17 mm/hr (ref 0–20)

## 2021-12-09 LAB — HEMOGLOBIN A1C: Hgb A1c MFr Bld: 6.3 % (ref 4.6–6.5)

## 2021-12-09 LAB — URIC ACID: Uric Acid, Serum: 4.9 mg/dL (ref 4.0–7.8)

## 2021-12-09 LAB — PSA: PSA: 1.76 ng/mL (ref 0.10–4.00)

## 2021-12-09 LAB — TSH: TSH: 3.18 u[IU]/mL (ref 0.35–5.50)

## 2021-12-09 MED ORDER — ATORVASTATIN CALCIUM 40 MG PO TABS: 40.0000 mg | ORAL_TABLET | Freq: Every day | ORAL | 4 refills | Status: DC

## 2021-12-09 MED ORDER — ALLOPURINOL 300 MG PO TABS
300.0000 mg | ORAL_TABLET | Freq: Every day | ORAL | 4 refills | Status: DC
Start: 2021-12-09 — End: 2022-12-24

## 2021-12-09 MED ORDER — PHENTERMINE HCL 30 MG PO CAPS: ORAL_CAPSULE | ORAL | 2 refills | Status: DC

## 2021-12-09 MED ORDER — AMLODIPINE BESYLATE 5 MG PO TABS: 5.0000 mg | ORAL_TABLET | Freq: Every day | ORAL | 4 refills | Status: DC

## 2021-12-09 MED ORDER — LOSARTAN POTASSIUM-HCTZ 100-25 MG PO TABS: 1.0000 | ORAL_TABLET | Freq: Every day | ORAL | 4 refills | Status: DC

## 2021-12-09 MED ORDER — LEVOTHYROXINE SODIUM 100 MCG PO TABS: ORAL_TABLET | ORAL | 4 refills | Status: DC

## 2021-12-09 MED ORDER — PHENTERMINE HCL 30 MG PO CAPS: ORAL_CAPSULE | ORAL | 0 refills | Status: DC

## 2021-12-09 NOTE — Assessment & Plan Note (Signed)
Advanced directive - does not have. Would want wife Robert Shea to be HCPOA. Packet provided today.

## 2021-12-09 NOTE — Assessment & Plan Note (Signed)

## 2021-12-09 NOTE — Progress Notes (Unsigned)
Patient ID: Robert Shea, male    DOB: 06-01-1955, 66 y.o.   MRN: [id number]  This visit was conducted in person.  BP 136/68   Pulse 69   Temp (!) 97.1 F (36.2 C) (Temporal)   Ht 6' 1.5" (1.867 m)   Wt 280 lb 12.8 oz (127.4 kg)   SpO2 97%   BMI 36.54 kg/m    CC: welcome to medicare visit  Subjective:   HPI: Robert Shea is a 66 y.o. male presenting on 12/09/2021 for Welcome to Medicare Exam   Hearing Screening   _0  _1  _2  _3   Right ear _4 0  Left ear _5 0  Comments: Pt states hearing is worsening.   Vision Screening   Right eye Left eye Both eyes  Without correction     With correction _6  Known L eye decreased vision - has upcoming eye doctor appointment 01/2022.  Bilateral tinnitus. Notes difficulty with distinguishing voices.  [facility] Visit from 12/09/2021 in [facility]  PHQ-2 Total Score 0          12/09/2021    8:59 AM  Fall Risk   Number falls in past yr: 0  Injury with Fall? 0     Lives in [place], stays locally as well.   Osteoarthritis, gout, PMR - was seeing rheum (Deveshwar) on allopurinol and PRN mitigare. PCP now manages gout.   HTN - changed to no salt/low salt diet.   Obesity - has managed with phentermine PRN appetite suppression. Contrave was unaffordable. No fmhx thyroid cancer. Might look into [place] eligibility. Requests phentermine refilled - normally takes daily for first month then PRN with good effect. Predominant issue is he stays hungry, has trouble with craving.  Wt Readings from Last 3 Encounters:  12/09/21 280 lb 12.8 oz (127.4 kg)  05/27/21 276 lb 2 oz (125.2 kg)  01/28/21 270 lb 6 oz (122.6 kg)    Since last seen, has had another L RTC shoulder surgery 08/2021. This was 3rd one. Continues rehab with PT. Orthopedist Dr Cassandria Santee in [place]. Less active due to this. He is walking 2-3 miles daily.   Preventative: COLONOSCOPY Date: 05/2013 1  hyperplastic polyp, rpt 10 yrs Ardis Hughs) Prostate cancer screening - had seen urology - requests PSA through our office Lung cancer screening - yearly - scheduled for next week. Incidental emphysema and aortic atherosclerosis.  Flu - yearly  COVID vaccine - declined Tdap - 04/2014, 12/2018  Pneumovax 2016, [id number] due - will return for this Shingrix - 12/2019, rpt today  Advanced directive - does not have. Would want wife Nevin Bloodgood to be HCPOA. Packet provided today.  Seat belt use discussed Sunscreen use discussed. No changing moles on skin. Sees derm yearly.  Ex smoker - quit 2009. Prior 1 ppd for 30 yrs.  Alcohol - 1-2 drinks 4 nights a week, limits beer Dentist Q6 mo  Eye exam yearly  Bowel - chronic constipation managed with PRN suppository  Bladder - no incontinence    Lives with wife Grown child Part time lives in [place] part time local Occ: [employer]: BS Activity: walking 2-3 miles daily  Diet: healthy - good water, fruits/vegetables daily, avoids fried and fatty foods      Relevant past medical, surgical, family and social history reviewed and updated as indicated. Interim medical history since our last visit reviewed. Allergies and medications reviewed and updated.  Outpatient Medications Prior to Visit  Medication Sig Dispense Refill   sildenafil (VIAGRA) 100 MG tablet TAKE 1/2 TO 1 TABLET BY MOUTH DAILY AS NEEDED 10 tablet 3   allopurinol (ZYLOPRIM) 300 MG tablet TAKE 1 TABLET BY MOUTH EVERY DAY 90 tablet 3   amLODipine (NORVASC) 5 MG tablet TAKE 1 TABLET (5 MG TOTAL) BY MOUTH DAILY. 30 tablet 2   atorvastatin (LIPITOR) 40 MG tablet TAKE 1 TABLET BY MOUTH EVERY DAY 90 tablet 0   levothyroxine (SYNTHROID) 100 MCG tablet TAKE 1 TABLET BY MOUTH EVERY DAY BEFORE BREAKFAST 30 tablet 2   losartan-hydrochlorothiazide (HYZAAR) 100-25 MG tablet TAKE 1 TABLET BY MOUTH EVERY DAY 90 tablet 0   Naltrexone-buPROPion HCl ER (CONTRAVE) 8-90 MG TB12 Start 1 tablet every morning  for 7 days, then 1 tablet twice daily for 7 days, then 2 tablets every morning and one every evening for 7 days then two tablets twice daily 120 tablet 0   phentermine 30 MG capsule TAKE 1 CAPSULE BY MOUTH EVERY DAY IN THE MORNING (Patient not taking: Reported on 12/09/2021) 30 capsule 0   No facility-administered medications prior to visit.     Per HPI unless specifically indicated in ROS section below Review of Systems  Objective:  BP 136/68   Pulse 69   Temp (!) 97.1 F (36.2 C) (Temporal)   Ht 6' 1.5" (1.867 m)   Wt 280 lb 12.8 oz (127.4 kg)   SpO2 97%   BMI 36.54 kg/m   Wt Readings from Last 3 Encounters:  12/09/21 280 lb 12.8 oz (127.4 kg)  05/27/21 276 lb 2 oz (125.2 kg)  01/28/21 270 lb 6 oz (122.6 kg)      Physical Exam Vitals and nursing note reviewed.  Constitutional:      General: He is not in acute distress.    Appearance: Normal appearance. He is well-developed. He is not ill-appearing.  HENT:     Head: Normocephalic and atraumatic.     Right Ear: Hearing, tympanic membrane, ear canal and external ear normal.     Left Ear: Hearing, tympanic membrane, ear canal and external ear normal.     Nose: Nose normal.     Mouth/Throat:     Mouth: Mucous membranes are moist.     Pharynx: Oropharynx is clear. No oropharyngeal exudate or posterior oropharyngeal erythema.  Eyes:     General: No scleral icterus.    Extraocular Movements: Extraocular movements intact.     Conjunctiva/sclera: Conjunctivae normal.     Pupils: Pupils are equal, round, and reactive to light.  Neck:     Thyroid: No thyroid mass or thyromegaly.     Vascular: No carotid bruit.  Cardiovascular:     Rate and Rhythm: Normal rate and regular rhythm.     Pulses: Normal pulses.          Radial pulses are 2+ on the right side and 2+ on the left side.     Heart sounds: Normal heart sounds. No murmur heard. Pulmonary:     Effort: Pulmonary effort is normal. No respiratory distress.     Breath sounds:  Normal breath sounds. No wheezing, rhonchi or rales.  Abdominal:     General: Bowel sounds are normal. There is no distension.     Palpations: Abdomen is soft. There is no mass.     Tenderness: There is no abdominal tenderness. There is no guarding or rebound.     Hernia: No hernia is present.  Musculoskeletal:  General: Normal range of motion.     Cervical back: Normal range of motion and neck supple.     Right lower leg: No edema.     Left lower leg: No edema.  Lymphadenopathy:     Cervical: No cervical adenopathy.  Skin:    General: Skin is warm and dry.     Findings: No rash.  Neurological:     General: No focal deficit present.     Mental Status: He is alert and oriented to person, place, and time.     Comments:  Recall 3/3 Calculation 5/5 DLROW  Psychiatric:        Mood and Affect: Mood normal.        Behavior: Behavior normal.        Thought Content: Thought content normal.        Judgment: Judgment normal.       Results for orders placed or performed in visit on 12/09/21  Lipid panel  Result Value Ref Range   Cholesterol 164 0 - 200 mg/dL   Triglycerides 194.0 (H) 0.0 - 149.0 mg/dL   HDL 43.70 >39.00 mg/dL   VLDL 38.8 0.0 - 40.0 mg/dL   LDL Cholesterol 81 0 - 99 mg/dL   Total CHOL/HDL Ratio 4    NonHDL 120.09   Comprehensive metabolic panel  Result Value Ref Range   Sodium 139 135 - 145 mEq/L   Potassium 3.7 3.5 - 5.1 mEq/L   Chloride 101 96 - 112 mEq/L   CO2 27 19 - 32 mEq/L   Glucose, Bld 139 (H) 70 - 99 mg/dL   BUN 19 6 - 23 mg/dL   Creatinine, Ser 1.08 0.40 - 1.50 mg/dL   Total Bilirubin 1.0 0.2 - 1.2 mg/dL   Alkaline Phosphatase 61 39 - 117 U/L   AST 22 0 - 37 U/L   ALT 26 0 - 53 U/L   Total Protein 7.3 6.0 - 8.3 g/dL   Albumin 5.1 3.5 - 5.2 g/dL   GFR 72.01 >60.00 mL/min   Calcium 9.7 8.4 - 10.5 mg/dL  Hemoglobin A1c  Result Value Ref Range   Hgb A1c MFr Bld 6.3 4.6 - 6.5 %  TSH  Result Value Ref Range   TSH 3.18 0.35 - 5.50 uIU/mL   PSA  Result Value Ref Range   PSA 1.76 0.10 - 4.00 ng/mL  CBC with Differential/Platelet  Result Value Ref Range   WBC 5.5 4.0 - 10.5 K/uL   RBC 4.35 4.22 - 5.81 Mil/uL   Hemoglobin 14.2 13.0 - 17.0 g/dL   HCT 41.0 39.0 - 52.0 %   MCV 94.3 78.0 - 100.0 fl   MCHC 34.8 30.0 - 36.0 g/dL   RDW 13.1 11.5 - 15.5 %   Platelets 229.0 150.0 - 400.0 K/uL   Neutrophils Relative % 55.8 43.0 - 77.0 %   Lymphocytes Relative 31.6 12.0 - 46.0 %   Monocytes Relative 8.6 3.0 - 12.0 %   Eosinophils Relative 3.2 0.0 - 5.0 %   Basophils Relative 0.8 0.0 - 3.0 %   Neutro Abs 3.1 1.4 - 7.7 K/uL   Lymphs Abs 1.7 0.7 - 4.0 K/uL   Monocytes Absolute 0.5 0.1 - 1.0 K/uL   Eosinophils Absolute 0.2 0.0 - 0.7 K/uL   Basophils Absolute 0.0 0.0 - 0.1 K/uL  Uric acid  Result Value Ref Range   Uric Acid, Serum 4.9 4.0 - 7.8 mg/dL  Sedimentation rate  Result Value Ref Range   Sed  Rate 17 0 - 20 mm/hr   EKG - NSR rate 60s, normal axis, intervals, no hypertrophy or acute ST/T changes  Assessment & Plan:   Problem List Items Addressed This Visit       Unprioritized   Welcome to Medicare preventive visit - Primary (Chronic)    I have personally reviewed the Medicare Annual Wellness questionnaire and have noted 1. The patient's medical and social history 2. Their use of alcohol, tobacco or illicit drugs 3. Their current medications and supplements 4. The patient's functional ability including ADL's, fall risks, home safety risks and hearing or visual impairment. Cognitive function has been assessed and addressed as indicated.  5. Diet and physical activity 6. Evidence for depression or mood disorders The patients weight, height, BMI have been recorded in the chart. I have made referrals, counseling and provided education to the patient based on review of the above and I have provided the pt with a written personalized care plan for preventive services. Provider list updated.. See scanned questionairre as  needed for further documentation. Reviewed preventative protocols and updated unless pt declined.       Relevant Orders   EKG 12-Lead (Completed)   Advanced directives, counseling/discussion (Chronic)    Advanced directive - does not have. Would want wife Nevin Bloodgood to be HCPOA. Packet provided today.       Gout    Continue allopurinol 34m daily - refilled. Update urate. No recent gout flares.      Relevant Orders   CBC with Differential/Platelet (Completed)   Uric acid (Completed)   HTN (hypertension)    Chronic, stable. Continue hyzaar and amlodipine, consider stopping hctz component if gout flare occurs.       Relevant Medications   amLODipine (NORVASC) 5 MG tablet   atorvastatin (LIPITOR) 40 MG tablet   losartan-hydrochlorothiazide (HYZAAR) 100-25 MG tablet   HLD (hyperlipidemia)    Chronic, update levels on atorvastatin. The 10-year ASCVD risk score (Arnett DK, et al., 2019) is: 15.4%   Values used to calculate the score:     Age: [age]years     Sex: Male     Is Non-Hispanic African American: No     Diabetic: No     Tobacco smoker: No     Systolic Blood Pressure: 1638mmHg     Is BP treated: Yes     HDL Cholesterol: 43.7 mg/dL     Total Cholesterol: 164 mg/dL       Relevant Medications   amLODipine (NORVASC) 5 MG tablet   atorvastatin (LIPITOR) 40 MG tablet   losartan-hydrochlorothiazide (HYZAAR) 100-25 MG tablet   Other Relevant Orders   Lipid panel (Completed)   Comprehensive metabolic panel (Completed)   Hypothyroidism    Update TSH on levothyroxine.       Relevant Medications   levothyroxine (SYNTHROID) 100 MCG tablet   Other Relevant Orders   TSH (Completed)   Thyroid Peroxidase Antibodies (TPO) (REFL)   TRAb (TSH Receptor Binding Antibody)   Prediabetes    Encouraged weight loss and limiting added sugars in diet, update A1c.       Relevant Orders   Hemoglobin A1c (Completed)   Severe obesity (BMI 35.0-39.9) with comorbidity ([place]    Discussed  recent weight gain. He is motivated to implement healthy diet and lifestyle changes to attain sustainable weight loss. Obesity complicated by comorbidities of HTN, HLD, gout, OA Contrave was unaffordable. Desires to restart phentermine - normally takes daily for a month then PRN afterwards with  good effect.  RTC 3 mo weight f/u visit.       Relevant Medications   phentermine 30 MG capsule   PMR (polymyalgia rheumatica) (HCC)    Quiescent period, update ESR      Relevant Orders   Sedimentation rate (Completed)   OSA on CPAP    Continue BiPAP nightly      Aortic atherosclerosis ([place])    Incidental finding - continue statin.       Relevant Medications   amLODipine (NORVASC) 5 MG tablet   atorvastatin (LIPITOR) 40 MG tablet   losartan-hydrochlorothiazide (HYZAAR) 100-25 MG tablet   Emphysema of lung (HCC)    Asxs from respiratory standpoint.       Increased prostate specific antigen (PSA) velocity    Update PSA.      Relevant Orders   PSA (Completed)   Stenosis of left subclavian artery (HCC)    Consider updated carotid US      Relevant Medications   amLODipine (NORVASC) 5 MG tablet   atorvastatin (LIPITOR) 40 MG tablet   losartan-hydrochlorothiazide (HYZAAR) 100-25 MG tablet     Meds ordered this encounter  Medications   DISCONTD: phentermine 30 MG capsule    Sig: TAKE 1 CAPSULE BY MOUTH EVERY DAY IN THE MORNING    Dispense:  30 capsule    Refill:  0   allopurinol (ZYLOPRIM) 300 MG tablet    Sig: Take 1 tablet (300 mg total) by mouth daily.    Dispense:  90 tablet    Refill:  4   amLODipine (NORVASC) 5 MG tablet    Sig: Take 1 tablet (5 mg total) by mouth daily.    Dispense:  90 tablet    Refill:  4   atorvastatin (LIPITOR) 40 MG tablet    Sig: Take 1 tablet (40 mg total) by mouth daily.    Dispense:  90 tablet    Refill:  4   levothyroxine (SYNTHROID) 100 MCG tablet    Sig: TAKE 1 TABLET BY MOUTH EVERY DAY BEFORE BREAKFAST    Dispense:  90 tablet     Refill:  4   losartan-hydrochlorothiazide (HYZAAR) 100-25 MG tablet    Sig: Take 1 tablet by mouth daily.    Dispense:  90 tablet    Refill:  4   phentermine 30 MG capsule    Sig: TAKE 1 CAPSULE BY MOUTH EVERY DAY IN THE MORNING    Dispense:  30 capsule    Refill:  2   Orders Placed This Encounter  Procedures   Lipid panel   Comprehensive metabolic panel   Hemoglobin A1c   TSH   PSA   CBC with Differential/Platelet   Uric acid   Sedimentation rate   Thyroid Peroxidase Antibodies (TPO) (REFL)   TRAb (TSH Receptor Binding Antibody)   EKG 12-Lead    Patient instructions: Keep eye doctor appointment in January.  Labs today  Advanced directive packet provided today. We will order AAA screen.  We will refer you to audiologist for formal hearing evaluation.  Schedule nurse visit for [id number].  Return in 3-4 months for follow up visit.   Follow up plan: Return in about 3 months (around 03/10/2022) for follow up visit.  Ria Bush, MD

## 2021-12-09 NOTE — Patient Instructions (Addendum)
Keep eye doctor appointment in January.  Labs today  Advanced directive packet provided today. We will order AAA screen.  We will refer you to audiologist for formal hearing evaluation.  Schedule nurse visit for [id number].  Return in 3-4 months for follow up visit.   Health Maintenance After Age 65 After age 75, you are at a higher risk for certain long-term diseases and infections as well as injuries from falls. Falls are a major cause of broken bones and head injuries in people who are older than age 91. Getting regular preventive care can help to keep you healthy and well. Preventive care includes getting regular testing and making lifestyle changes as recommended by your health care provider. Talk with your health care provider about: Which screenings and tests you should have. A screening is a test that checks for a disease when you have no symptoms. A diet and exercise plan that is right for you. What should I know about screenings and tests to prevent falls? Screening and testing are the best ways to find a health problem early. Early diagnosis and treatment give you the best chance of managing medical conditions that are common after age 70. Certain conditions and lifestyle choices may make you more likely to have a fall. Your health care provider may recommend: Regular vision checks. Poor vision and conditions such as cataracts can make you more likely to have a fall. If you wear glasses, make sure to get your prescription updated if your vision changes. Medicine review. Work with your health care provider to regularly review all of the medicines you are taking, including over-the-counter medicines. Ask your health care provider about any side effects that may make you more likely to have a fall. Tell your health care provider if any medicines that you take make you feel dizzy or sleepy. Strength and balance checks. Your health care provider may recommend certain tests to check your  strength and balance while standing, walking, or changing positions. Foot health exam. Foot pain and numbness, as well as not wearing proper footwear, can make you more likely to have a fall. Screenings, including: Osteoporosis screening. Osteoporosis is a condition that causes the bones to get weaker and break more easily. Blood pressure screening. Blood pressure changes and medicines to control blood pressure can make you feel dizzy. Depression screening. You may be more likely to have a fall if you have a fear of falling, feel depressed, or feel unable to do activities that you used to do. Alcohol use screening. Using too much alcohol can affect your balance and may make you more likely to have a fall. Follow these instructions at home: Lifestyle Do not drink alcohol if: Your health care provider tells you not to drink. If you drink alcohol: Limit how much you have to: 0-1 drink a day for women. 0-2 drinks a day for men. Know how much alcohol is in your drink. In the [place] one drink equals one 12 oz bottle of beer (355 mL), one 5 oz glass of wine (148 mL), or one 1 oz glass of hard liquor (44 mL). Do not use any products that contain nicotine or tobacco. These products include cigarettes, chewing tobacco, and vaping devices, such as e-cigarettes. If you need help quitting, ask your health care provider. Activity  Follow a regular exercise program to stay fit. This will help you maintain your balance. Ask your health care provider what types of exercise are appropriate for you. If you need a cane  or walker, use it as recommended by your health care provider. Wear supportive shoes that have nonskid soles. Safety  Remove any tripping hazards, such as rugs, cords, and clutter. Install safety equipment such as grab bars in bathrooms and safety rails on stairs. Keep rooms and walkways well-lit. General instructions Talk with your health care provider about your risks for falling. Tell your  health care provider if: You fall. Be sure to tell your health care provider about all falls, even ones that seem minor. You feel dizzy, tiredness (fatigue), or off-balance. Take over-the-counter and prescription medicines only as told by your health care provider. These include supplements. Eat a healthy diet and maintain a healthy weight. A healthy diet includes low-fat dairy products, low-fat (lean) meats, and fiber from whole grains, beans, and lots of fruits and vegetables. Stay current with your vaccines. Schedule regular health, dental, and eye exams. Summary Having a healthy lifestyle and getting preventive care can help to protect your health and wellness after age 49. Screening and testing are the best way to find a health problem early and help you avoid having a fall. Early diagnosis and treatment give you the best chance for managing medical conditions that are more common for people who are older than age 3. Falls are a major cause of broken bones and head injuries in people who are older than age 64. Take precautions to prevent a fall at home. Work with your health care provider to learn what changes you can make to improve your health and wellness and to prevent falls. This information is not intended to replace advice given to you by your health care provider. Make sure you discuss any questions you have with your health care provider. Document Revised: 05/13/2020 Document Reviewed: 05/13/2020 Elsevier Patient Education  Robert Shea.

## 2021-12-09 NOTE — Assessment & Plan Note (Signed)
Asxs from respiratory standpoint.

## 2021-12-09 NOTE — Assessment & Plan Note (Signed)
Incidental finding - continue statin.

## 2021-12-10 NOTE — Assessment & Plan Note (Signed)
Update TSH on levothyroxine.

## 2021-12-10 NOTE — Assessment & Plan Note (Addendum)
Continue allopurinol '300mg'$  daily - refilled. Update urate. No recent gout flares.

## 2021-12-10 NOTE — Assessment & Plan Note (Signed)
Update PSA 

## 2021-12-10 NOTE — Assessment & Plan Note (Signed)
Consider updated carotid US.  

## 2021-12-10 NOTE — Assessment & Plan Note (Signed)
Continue BiPAP nightly. 

## 2021-12-10 NOTE — Assessment & Plan Note (Signed)
Quiescent period, update ESR

## 2021-12-10 NOTE — Assessment & Plan Note (Addendum)
Discussed recent weight gain. He is motivated to implement healthy diet and lifestyle changes to attain sustainable weight loss. Obesity complicated by comorbidities of HTN, HLD, gout, OA Contrave was unaffordable. Desires to restart phentermine - normally takes daily for a month then PRN afterwards with good effect.  RTC 3 mo weight f/u visit.

## 2021-12-10 NOTE — Assessment & Plan Note (Signed)
Chronic, stable. Continue hyzaar and amlodipine, consider stopping hctz component if gout flare occurs.

## 2021-12-10 NOTE — Assessment & Plan Note (Signed)
Encouraged weight loss and limiting added sugars in diet, update A1c.

## 2021-12-10 NOTE — Assessment & Plan Note (Addendum)
Chronic, update levels on atorvastatin. The 10-year ASCVD risk score (Arnett DK, et al., 2019) is: 15.4%   Values used to calculate the score:     Age: 66 years     Sex: Male     Is Non-Hispanic African American: No     Diabetic: No     Tobacco smoker: No     Systolic Blood Pressure: 037 mmHg     Is BP treated: Yes     HDL Cholesterol: 43.7 mg/dL     Total Cholesterol: 164 mg/dL

## 2021-12-11 LAB — TRAB (TSH RECEPTOR BINDING ANTIBODY): TRAB: <1 IU/L (ref <=2.00)

## 2021-12-11 LAB — THYROID PEROXIDASE ANTIBODIES (TPO) (REFL): Thyroperoxidase Ab SerPl-aCnc: 39 IU/mL — ABNORMAL HIGH (ref <9)

## 2021-12-15 ENCOUNTER — Encounter: Payer: Self-pay | Admitting: Family Medicine

## 2021-12-16 NOTE — Telephone Encounter (Signed)
Replied via lab results section.

## 2021-12-19 ENCOUNTER — Ambulatory Visit
Admission: RE | Admit: 2021-12-19 | Discharge: 2021-12-19 | Disposition: A | Discharge status: Home / Self Care | Payer: Medicare Other | Source: Ambulatory Visit | Attending: Acute Care | Admitting: Acute Care

## 2021-12-19 DIAGNOSIS — Z87891 Personal history of nicotine dependence: Secondary | ICD-10-CM | POA: Diagnosis present

## 2021-12-22 ENCOUNTER — Ambulatory Visit: Payer: Medicare Other

## 2021-12-23 ENCOUNTER — Encounter: Payer: Self-pay | Admitting: Family Medicine

## 2021-12-23 DIAGNOSIS — G4733 Obstructive sleep apnea (adult) (pediatric): Secondary | ICD-10-CM

## 2021-12-31 NOTE — Telephone Encounter (Signed)
Rx written and in Lisa's box. I asked pt to let us know name and fax of his home [facility].

## 2022-01-01 NOTE — Telephone Encounter (Addendum)
Noted.   [Written rx is in basket on [address].]

## 2022-01-04 ENCOUNTER — Other Ambulatory Visit: Payer: Self-pay | Admitting: Family Medicine

## 2022-01-08 NOTE — Telephone Encounter (Signed)
Name of Medication: Phentermine Name of Pharmacy: [facility] or Written Date and Quantity: 12/10/21, #30 Last Office Visit and Type: 12/09/21, W 2 MCR Next Office Visit and Type: 03/24/22, [facility] mgmt f/u Last Controlled Substance Agreement Date: none Last UDS: none

## 2022-01-09 NOTE — Telephone Encounter (Signed)
Faxed written order and 12/09/21 OV notes with Dr. Darnell Level to [facility] at [phone], att[place]. However, sleep study results/notes will need to come from pulmonology.

## 2022-01-09 NOTE — Telephone Encounter (Signed)
ERx 

## 2022-01-09 NOTE — Telephone Encounter (Signed)
Provided fax # was incorrect. Faxed info to [facility], [place] at [phone].

## 2022-01-12 NOTE — Discharge Summary (Signed)
Formatting of this note is different from the original.  PHYSICAL THERAPY DISCHARGE SUMMARY  OUTPATIENT  [facility] - Physical Therapy  Type:  Discharge  Patient Name: Joseph Goodman  Date of Assessment: 01/12/2022  Date of Birth:  09/13/1955   Age: 22yrs  MRN: [id number]  Referring Physician: Blase Mess, MD   Date of Initial Eval/Last Progress Report: 12/15/2021  Service Dates:  From 12/16/2021 to 01/12/2022  Visits from Start of Care:  22      Medical Diagnosis: M25.512 (ICD-10-CM) - Left shoulder pain  PT Treatment Diagnosis : M25.512 (ICD-10-CM) - Left shoulder pain, S/P L RCR with acromioplasty  Date of Onset: 08/22/21  Reason for Referral: Evaluation & Treatment  Comorbidities impacting therapy progression: Shoulder arthritis with hx of multiple surgeries, RA  PT Start of Care Date: 12/22/21    Characteristics of discharge environment  Occupation: Emergency planning/management officer, [employer]  Hobbies: Softball and golf, has been able to do these for 2 years, Comptroller: Drives    Precautions/Restrictions:  Precautions/ Restrictions: per protocal week 12-26 avoid painful ADLs , rotator cuff inflammation,excessive passive stretching. Full ROM    Discharge Criteria  Patient decision  Patient's goals achieved.    Objective Tests and Measurements  Left Shoulder Active ROM  Active Flexion: 160  Active Abduction: 160  Left Shoulder Passive ROM  Passive Flexion: 170  Passive External Rotation: 80  Passive Internal Rotation: WNL  LEFT SHOULDER STRENGTH  Flexion: 5/5  Abduction: 5/5  External Rotation: 5/5  Internal Rotation: 5/5  LEFT ELBOW STRENGTH  Flexion: 5/5  Extension: 5/5      Subjective/Pain/Vital Signs   Subjective  Patient/family stated goals/comments: Pt presents today requesting discharge from PT.  He saw his surgeon last Friday who cleared him from his services.  The patient feels that he can contintue with performance of his exercises and has good understanding  of what he can do at the gym.  Pain  Pain Intensity Numbers Scale: 2 - Mild Pain  Best pain level: 0  Worst pain level: 3  Pain Location: L ant shoulder  Pain Characteristics: Intermittent      THERAPEUTIC EXERCISE/INTERVENTIONS:   General Exercise 1  Exercise Performed: Discussion of progresion of home program, gym activities      PT Treatment Minutes  PT Time Based Codes-Total Mins: 15  Total Time-Time Based plus Procedure Based Charges: 15       Therapeutic Procedure/Exercise Minutes: 15  Time In: 1100  Time Out: 1115      Progress toward Goals/Functional Level at Discharge-Pt is comfortable with the progression of his home program and requested discharge from PT today.  He only had 2 more scheduled session with the goal to start progression towards golf related activities.  Pt educated on this and that he could resume PT if he was not progressing as expected.    Physical Therapy Goals/Outcomes       Physical Therapy Goals/Outcomes (Active)       There are no active problems.                 Physical Therapy Goals/Outcomes (Resolved)       Problem: *Physical Therapy Short Term Goals       Dates: Start:  09/16/21    Resolved:  01/12/22    Disciplines: PT      Goal: Short Term Goal #1 (Resolved)       Dates: Start:  09/16/21  Expected End:  02/02/22    Resolved:  01/12/22    Description: Pt will be independent/compliant with HEP.   Initial Status: [facility] provided by email  10/24/21:  Ongoing progression per protocol  12/15/21:  Able to progress past 12 phase of protocol    Disciplines: PT      Outcomes       Date/Time User Outcome    01/12/22 1345 Arman Filter, PT Met          Goal: Short Term Goal #2 (Resolved)       Dates: Start:  09/16/21    Expected End:  10/03/21    Resolved:  10/24/21    Description: Pt will have increase in PROM FF to 120 degrees  Initial Status: 85 degree flexion  10/24/21:  138 degrees flexion    Disciplines: PT      Outcomes       Date/Time User Outcome    10/24/21 1058 Arman Filter, PT Met          Goal: Short Term Goals #5 (Resolved)       Dates: Start:  12/15/21    Expected End:  02/02/22    Resolved:  01/12/22    Description: Pt will be able to resume light golf related activities.    Initial Status: unable, would like to return to golfing    Disciplines: PT      Outcomes       Date/Time User Outcome    01/12/22 1345 Arman Filter, PT Not Met, Adequate for Care Transition            Problem: Physical Therapy Long Term Goals       Dates: Start:  09/16/21    Resolved:  01/12/22    Disciplines: PT      Goal: Long Term Goal #1 (Resolved)       Dates: Start:  09/16/21    Expected End:  12/22/21    Resolved:  10/24/21    Description: Pt will be able to return to light ADL's below 90 elevation of L UE without pain >4/10  Initial Status: pain up to 8/10 at rest, no use of L UE    Disciplines: PT      Outcomes       Date/Time User Outcome    10/24/21 1059 Arman Filter, PT Met          Goal: Long Term Goal #2 (Resolved)       Dates: Start:  09/16/21    Expected End:  12/22/21    Resolved:  12/15/21    Description: Pt will have increase in R shoulder PROM to FF 155, ABD 135, ER 45 as per protocol restrictions.    Initial Status: FF 85, ABD 80, ER 10 degrees  12/15/21:  FF 165, AbD160, ER 80, IR 70    Disciplines: PT      Outcomes       Date/Time User Outcome    12/15/21 1038 Roxan Hockey, Amy, PT Met          Goal: Long Term Goal #3 (Resolved)       Dates: Start:  10/24/21    Expected End:  12/22/21    Resolved:  12/15/21    Description: Pt will have improvement in DASH score by 5 points for decreased impairment .    Initial Status: 43 points  10/24/21:  38  12/15/21: 36    Disciplines: PT      Outcomes  Date/Time User Outcome    12/15/21 1039 Arman Filter, PT Met          Goal: Long Term Goal #4 (Resolved)       Dates: Start:  10/24/21    Expected End:  02/02/22    Resolved:  01/12/22    Description: Pt will have increase in R shoulder strength to 4/5 for full functional use of L UE.     Initial Status: NT  10/24/21:  R sh elevation 3-/5  12/15/21:  [place] Becker FF 4-/5    Disciplines: PT      Outcomes       Date/Time User Outcome    01/12/22 1345 Arman Filter, PT Met                 Discharge Plan  Pt/Caregiver education: Pt to continue with strengthening, stretching and gym based exercises.  Discussed progression towards golf related activities.      Electronically signed by:  Arman Filter, PT       Electronically signed by Arman Filter, PT at 01/12/2022  1:49 PM EST

## 2022-01-26 NOTE — Telephone Encounter (Signed)
Faxed new written order to [facility], [place] at [phone].

## 2022-01-26 NOTE — Telephone Encounter (Signed)
Rx written and in Lisa's box.  

## 2022-02-25 ENCOUNTER — Ambulatory Visit: Payer: Medicare Other | Admitting: Cardiovascular Disease

## 2022-02-25 ENCOUNTER — Encounter: Payer: Self-pay | Admitting: Cardiovascular Disease

## 2022-02-25 VITALS — BP 148/60 | HR 83 | Ht 74.0 in | Wt 279.0 lb

## 2022-03-24 ENCOUNTER — Telehealth: Payer: Self-pay

## 2022-03-24 ENCOUNTER — Ambulatory Visit: Payer: Medicare Other | Admitting: Family Medicine

## 2022-03-24 NOTE — Telephone Encounter (Signed)
Lm for patient to ask if he is currently wearing cpap/bipap. If so, will he bring SD card to visit.

## 2022-03-25 ENCOUNTER — Institutional Professional Consult (permissible substitution): Payer: Medicare Other | Admitting: Internal Medicine

## 2022-03-25 NOTE — Telephone Encounter (Signed)
Lm x2 for patient.  Will close encounter per office protocol.   

## 2022-03-26 ENCOUNTER — Encounter: Payer: Self-pay | Admitting: Adult Health

## 2022-03-26 ENCOUNTER — Ambulatory Visit (INDEPENDENT_AMBULATORY_CARE_PROVIDER_SITE_OTHER): Payer: Medicare Other | Admitting: Adult Health

## 2022-03-26 VITALS — BP 130/72 | HR 73 | Temp 97.8°F | Ht 75.0 in | Wt 283.0 lb

## 2022-03-26 DIAGNOSIS — G4733 Obstructive sleep apnea (adult) (pediatric): Secondary | ICD-10-CM | POA: Diagnosis not present

## 2022-03-26 NOTE — Assessment & Plan Note (Signed)
Moderate obstructive sleep apnea with reported excellent compliance and control on BiPAP.  Patient has been on this long-term since 2005.  Needs a new BiPAP machine.  Patient will need to obtain an SD card so that we can get a download to document compliance.  Once this is done can send an order in for his BiPAP.  Also will be able to get his current settings.  Patient education on sleep apnea.  Patient has perceived benefit on BiPAP.  Plan  Patient Instructions  Continue on BIPAP At bedtime  Keep up good work  Bring SD card back in 1 month for download , once you do we will order new BIPAP  Work on healthy weight loss Do not drive if sleepy  Follow up in 1 year and As needed

## 2022-03-26 NOTE — Progress Notes (Signed)
@Patient  ID: Robert Shea, male    DOB: 1955-09-12, 67 y.o.   MRN: [id number]  Chief Complaint  Patient presents with   Sleep consult    Referring provider: Ria Bush, MD  HPI: 67 year old seen for sleep consult March 26, 2022 to reestablish for sleep apnea Previously seen in 2017 for sleep consult to establish for sleep apnea with Dr. Mortimer Fries  Diagnosed with sleep apnea in the late 1990s on CPAP briefly.  Was retested in 2005 and placed on BiPAP therapy.  TEST/EVENTS :  Home sleep study January 2018 showed moderate sleep apnea with AHI at 20/hour.  03/26/2022 Sleep consult  Patient presents to reestablish for sleep apnea.  Patient was diagnosed with sleep apnea in the 1990s.  Was CPAP intolerant.  Was retested in 2005 and placed on BiPAP with significant improvement in symptoms.  Patient has been on BiPAP since then.  Patient says he has had multiple machines over the years.  Patient travels quite a bit and also has 2 homes.  Patient was seen in 2017 with Dr. Mortimer Fries to establish for sleep apnea.  At that time he was set up for home sleep study that showed moderate sleep apnea with AHI at 20.  New BiPAP was ordered.  However patient ended up buying directly from online because it was cheaper than his insurance.  Patient says his machine is getting old.  He wears it every single night cannot live without it.  And needs a new BiPAP machine.  Patient typically goes to sleep about 10 PM.  Takes about 20 minutes to go to sleep.  Gets up at 6 AM.  Weight is up about 20 pounds over the last 2 years.  Current weight is at 283 pounds.  Patient does not nap.  Has 2 cups of coffee daily.  Does not use any sleep aids.  No symptoms suspicious for cataplexy.  Epworth score is 3 out of 24.  Gets sleepy in the evening hours.  Uses nasal pillows.  Patient feels rested and feels that he benefits from BiPAP.  Says that he absolutely cannot sleep without it.  BiPAP machine does not have an SD card.  Patient  says he will purchase this and bring it back in for a download in 4 weeks  Social history patient works in [employer].  Has a home and the [employer].  And also in [place].  Travels back and forth between both.  Patient is a former smoker quit in 2009.  Social alcohol.  No drug use.  He is married and has 1 son.  Family history positive heart disease and cancer.  Past Surgical History:  Procedure Laterality Date   COLONOSCOPY  07/2009   2 adenomas rpt 5 yrs Collie Siad)   COLONOSCOPY  05/2013   1 hyperplastic polyp, rpt 10 yrs Ardis Hughs)   [place]     Allergies  Allergen Reactions   Uloric [Febuxostat]     Immunization History  Administered Date(s) Administered   Influenza,inj,Quad PF,6+ Mos 09/19/2013, 02/25/2015, 10/25/2015, 12/21/2017, 09/27/2018, 12/13/2019, 11/26/2020   Pneumococcal Polysaccharide-23 04/17/2014   Tdap 04/25/2014, 12/29/2018   Zoster Recombinat (Shingrix) 12/13/2019, 07/24/2020    Past Medical History:  Diagnosis Date   Concussion 07/11/2020   Ex-smoker 2009   quit   Gout    Heart murmur longstanding   History of chicken pox    History of colon polyps 2010   benign   History of measles    HLD (hyperlipidemia)  HTN (hypertension) 2010   Hypothyroidism 2010   Obesity    PMR (polymyalgia rheumatica) ([place]) 01/30/2014    Tobacco History: Social History   Tobacco Use  Smoking Status Former   Packs/day: 1.00   Years: 34.00   Additional pack years: 0.00   Total pack years: 34.00   Types: Cigarettes   Quit date: 01/06/2007   Years since quitting: 15.2  Smokeless Tobacco Never   Counseling given: Not Answered   Outpatient Medications Prior to Visit  Medication Sig Dispense Refill   allopurinol (ZYLOPRIM) 300 MG tablet Take 1 tablet (300 mg total) by mouth daily. 90 tablet 4   amLODipine (NORVASC) 5 MG tablet Take 1 tablet (5 mg total) by mouth daily. 90 tablet 4   atorvastatin (LIPITOR) 40 MG tablet Take 1 tablet (40 mg total) by mouth daily. 90  tablet 4   levothyroxine (SYNTHROID) 100 MCG tablet TAKE 1 TABLET BY MOUTH EVERY DAY BEFORE BREAKFAST 90 tablet 4   losartan-hydrochlorothiazide (HYZAAR) 100-25 MG tablet Take 1 tablet by mouth daily. 90 tablet 4   phentermine 30 MG capsule TAKE 1 CAPSULE BY MOUTH EVERY DAY IN THE MORNING 30 capsule 0   sildenafil (VIAGRA) 100 MG tablet TAKE 1/2 TO 1 TABLET BY MOUTH DAILY AS NEEDED 10 tablet 3   No facility-administered medications prior to visit.     Review of Systems:   Constitutional:   No  weight loss, night sweats,  Fevers, chills, fatigue, or  lassitude.  HEENT:   No headaches,  Difficulty swallowing,  Tooth/dental problems, or  Sore throat,                No sneezing, itching, ear ache, nasal congestion, post nasal drip,   CV:  No chest pain,  Orthopnea, PND, swelling in lower extremities, anasarca, dizziness, palpitations, syncope.   GI  No heartburn, indigestion, abdominal pain, nausea, vomiting, diarrhea, change in bowel habits, loss of appetite, bloody stools.   Resp: No shortness of breath with exertion or at rest.  No excess mucus, no productive cough,  No non-productive cough,  No coughing up of blood.  No change in color of mucus.  No wheezing.  No chest wall deformity  Skin: no rash or lesions.  GU: no dysuria, change in color of urine, no urgency or frequency.  No flank pain, no hematuria   MS:  No joint pain or swelling.  No decreased range of motion.  No back pain.    Physical Exam  BP 130/72 (BP Location: Left Arm, Cuff Size: Large)   Pulse 73   Temp 97.8 F (36.6 C) (Temporal)   Ht 6\' 3"  (1.905 m)   Wt 283 lb (128.4 kg)   SpO2 98%   BMI 35.37 kg/m   GEN: A/Ox3; pleasant , NAD, well nourished    HEENT:  [place]/AT,   NOSE-clear, THROAT-clear, no lesions, no postnasal drip or exudate noted.  Class III MP airway  NECK:  Supple w/ fair ROM; no JVD; normal carotid impulses w/o bruits; no thyromegaly or nodules palpated; no lymphadenopathy.    RESP  Clear  P  & A; w/o, wheezes/ rales/ or rhonchi. no accessory muscle use, no dullness to percussion  CARD:  RRR, no m/r/g, no peripheral edema, pulses intact, no cyanosis or clubbing.  GI:   Soft & nt; nml bowel sounds; no organomegaly or masses detected.   Musco: Warm bil, no deformities or joint swelling noted.   Neuro: alert, no focal deficits noted.  Skin: Warm, no lesions or rashes    Lab Results:  CBC  No results found for: "BNP"  ProBNP No results found for: "PROBNP"  Imaging: No results found.        No data to display          No results found for: "NITRICOXIDE"      Assessment & Plan:   OSA (obstructive sleep apnea) Moderate obstructive sleep apnea with reported excellent compliance and control on BiPAP.  Patient has been on this long-term since 2005.  Needs a new BiPAP machine.  Patient will need to obtain an SD card so that we can get a download to document compliance.  Once this is done can send an order in for his BiPAP.  Also will be able to get his current settings.  Patient education on sleep apnea.  Patient has perceived benefit on BiPAP.  Plan  Patient Instructions  Continue on BIPAP At bedtime  Keep up good work  Bring SD card back in 1 month for download , once you do we will order new BIPAP  Work on healthy weight loss Do not drive if sleepy  Follow up in 1 year and As needed        Rexene Edison, NP 03/26/2022

## 2022-03-26 NOTE — Patient Instructions (Signed)
Continue on BIPAP At bedtime  Keep up good work  Administrator, [employer] SD card back in 1 month for download , once you do we will order new BIPAP  Work on healthy weight loss Do not drive if sleepy  Follow up in 1 year and As needed

## 2022-03-28 DIAGNOSIS — G4733 Obstructive sleep apnea (adult) (pediatric): Secondary | ICD-10-CM

## 2022-03-30 NOTE — Telephone Encounter (Signed)
Tammy, please advise. Thanks 

## 2022-03-31 NOTE — Telephone Encounter (Signed)
Yes please set up for CPAP/BIPAP titration study.

## 2022-04-01 NOTE — Telephone Encounter (Signed)
Hi [name], we have placed an order for a CPAP/BiPAP titration study to be done. Do you know of any sleep centers near where you live? If not we can research places near where you live.   Robert Shea, CMA

## 2022-04-02 NOTE — Telephone Encounter (Signed)
I called local doctors in his area and [facility] is the closest Sleep Lab. I have faxed records to them for the patient to do Cpap Titration Study with them if Dr. Tacy Dura approves

## 2022-04-21 ENCOUNTER — Telehealth: Payer: Self-pay | Admitting: Adult Health

## 2022-04-21 DIAGNOSIS — G4733 Obstructive sleep apnea (adult) (pediatric): Secondary | ICD-10-CM

## 2022-04-21 NOTE — Telephone Encounter (Signed)
Formatting of this note might be different from the original.  I called and spoke with Marchelle Folks with [facility] she confirmed that they did get the Cpap Titration order but due to Medicare guidelines had to change the order to split night because previous sleep study was older then 5 years. Per Marchelle Folks the patient was a little concerned until she explained that the order was changed due to being insurance driven. Patient is actually scheduled to do sleep study tonight once it has been read they will fax the study results to our office  Electronically signed by Lilian Kapur at 04/21/2022  9:55 AM EDT

## 2022-04-21 NOTE — Telephone Encounter (Signed)
Patient has been scheduled to do sleep study tonight 04/21/2022 at [facility]

## 2022-04-21 NOTE — Telephone Encounter (Signed)
I called and spoke with Marchelle Folks with [facility] she confirmed that they did get the Cpap Titration order but due to Medicare guidelines had to change the order to split night because previous sleep study was older then 5 years. Per Marchelle Folks the patient was a little concerned until she explained that the order was changed due to being insurance driven. Patient is actually scheduled to do sleep study tonight once it has been read they will fax the study results to our office

## 2022-04-22 ENCOUNTER — Inpatient Hospital Stay: Admit: 2022-04-22 | Payer: MEDICARE

## 2022-04-28 NOTE — Telephone Encounter (Signed)
Patient is aware that sleep study has not been received from [place] [facility]. He is aware that we will contact him once received and reviewed by provider.  He stated that he would call back in one week for an update.  Nothing further needed at this time.

## 2022-04-28 NOTE — Telephone Encounter (Signed)
Patient checking on results of the in lab sleep study. Patient phone number is [phone].

## 2022-05-01 ENCOUNTER — Telehealth: Payer: Self-pay | Admitting: Adult Health

## 2022-05-01 NOTE — Telephone Encounter (Signed)
PT states he wants a new Bipap and told Ms. Parrett that. His is too old.  She sent him for a sleep study closer to where he lives and the hardware company called and told him he needs a Cpap. He insisted with them he told everyone up front he needed A Bipap, taht a Cpap will not work with him and were not very nice when he insisted.  He said he will go for another Sleep study near [facility] if he has to but he will only take a BIPAP.   Please call to advise him [phone]

## 2022-05-06 NOTE — Telephone Encounter (Addendum)
ATC the patient. LVM for patient to return my call.  Per Vara Guardian, we still have not received his Sleep Study results.

## 2022-05-07 NOTE — Telephone Encounter (Signed)
I have been trying to fax it to you. Just got it to go though to the team lead fax.

## 2022-05-07 NOTE — Telephone Encounter (Signed)
Where are results?

## 2022-05-07 NOTE — Telephone Encounter (Signed)
Called patient left voicemail to return call  I understand that he wanted BiPAP but unfortunately we have to go by the titration study results because we were trying to order him a new machine and that is what insurance requirements indicate. Please set up office visit or virtual visit to discuss his sleep study results and treatment plan.

## 2022-05-07 NOTE — Telephone Encounter (Signed)
I spoke with the patient. I told him we just received his Sleep Study results and Tammy has not reviewed the results yet. He said he has tried a CPAP in the past and he would wake up with his tongue stuck to the roof of his mouth and severe dry mouth. Since having the Bipap he has not had any problems. The only thing he wants is a BiPAP.   I told him we will call him back after Tammy has had a chance to review his Sleep Study.  Tammy- Herbert Seta has the patient's sleep study results and will give them to you tomorrow.

## 2022-05-08 NOTE — Telephone Encounter (Signed)
Attempted to call pt but unable to reach. Left message to return call.  

## 2022-05-26 ENCOUNTER — Telehealth: Payer: Self-pay | Admitting: *Deleted

## 2022-05-26 ENCOUNTER — Encounter: Payer: Self-pay | Admitting: *Deleted

## 2022-05-26 ENCOUNTER — Encounter: Payer: Self-pay | Admitting: Adult Health

## 2022-05-26 ENCOUNTER — Telehealth (INDEPENDENT_AMBULATORY_CARE_PROVIDER_SITE_OTHER): Payer: Medicare Other | Admitting: Adult Health

## 2022-05-26 DIAGNOSIS — G4733 Obstructive sleep apnea (adult) (pediatric): Secondary | ICD-10-CM

## 2022-05-26 NOTE — Patient Instructions (Addendum)
Continue on BIPAP At bedtime  BIPAP SD card , bring by office for download after 1 week of usage.  Will order for new BIPAP once we obtain download/settings.  Keep up good work  Work on [employer] loss Do not drive if sleepy  Follow up in 6 months with Dr Belia Heman and As needed

## 2022-05-26 NOTE — Progress Notes (Signed)
Virtual Visit via Video Note  I connected with Robert Shea on 05/26/22 at  2:30 PM EDT by a video enabled telemedicine application and verified that I am speaking with the correct person using two identifiers.  Location: Patient: Home  Provider: Office    I discussed the limitations of evaluation and management by telemedicine and the availability of in person appointments. The patient expressed understanding and agreed to proceed.  History of Present Illness: 67 year old male seen for sleep consult March 26, 2022 to reestablish for sleep apnea. Previously seen in 2017 for sleep consult to establish for sleep apnea with Dr. Belia Heman Diagnosed with sleep apnea in the late 1990s on CPAP briefly.  Was retested in 2005 and placed on BiPAP therapy.  Today's video visit is a 90-month follow-up.  Patient was seen last visit for sleep consult to reestablish for sleep apnea.  Patient was diagnosed with sleep apnea in the 1990s.  Was set on CPAP briefly, tried CPAP several times but was unable to tolerate.  He was retested and found to have ongoing sleep apnea 2005 and placed on BiPAP therapy.  Patient has been on BiPAP since then and says he can not sleep without it. Was seen last visit as his machine is getting old and needed a new machine.  He uses nasal pillow. Feels he benefits from BIPAP . Insurance would not approve new BIPAP . Unable to get BIPAP download as machine is older. SD card showed error.  Patient was recommended to have a BiPAP titration study.  Insurance required a split-night sleep study.  This was done on April 21, 2022.  This showed mild obstructive sleep apnea with positive PLM's.  Optimal control on CPAP 11 cm H2O.  Patient says unfortunately with CPAP he did not hardly sleep at all he woke up multiple times with CPAP very uncomfortable pulling at mask felt that it was very difficult for him to tolerate.  Patient says he requested BiPAP but was declined trial during study.  We discussed  his sleep study results in detail.  Advised that insurance will only cover a CPAP therapy.  Patient says he absolutely cannot do his CPAP he has tried it many times and it does not work.  The only thing that he can tolerate is BiPAP.  He is requesting a prescription for BiPAP so he can order independently and pay for it out-of-pocket.     Observations/Objective: Home sleep study January 2018 showed moderate sleep apnea with AHI at 20/hour.   Assessment and Plan: Longstanding sleep apnea dating back to the 1990s.  Patient has had perceived benefit on BiPAP since 2005.  Patient says he cannot tolerate CPAP.  Has tried it multiple times without success.  Most recent split-night sleep study shows mild sleep apnea with optimal control on CPAP despite multiple awakenings per patient.  Sleep study did show PLM's. We discussed his sleep study results in detail.  Went over options for treatment.  Patient declined CPAP.  Wants a order for BiPAP so he can buy independently and pay for out-of-pocket.  Have asked patient to get a BiPAP ST card so we can see what his current settings are and control he will obtain this in the next week or 2 and come by the office to get a download so we can put an order.  PLMs- can discuss on return, check iron /ferritin level . Look at [facility] to make sure under good control   Plan  Patient Instructions  Continue on BIPAP At bedtime  BIPAP SD card , bring by office for download after 1 week of usage.  Will order for new BIPAP once we obtain download/settings.  Keep up good work  Work on [employer] loss Do not drive if sleepy  Follow up in 6 months with Dr Belia Heman and As needed      Follow Up Instructions:    I discussed the assessment and treatment plan with the patient. The patient was provided an opportunity to ask questions and all were answered. The patient agreed with the plan and demonstrated an understanding of the instructions.   The patient was advised to  call back or seek an in-person evaluation if the symptoms worsen or if the condition fails to improve as anticipated.  I provided 22 minutes of non-face-to-face time during this encounter.   Robert Oaks, NP

## 2022-05-26 NOTE — Telephone Encounter (Signed)
Titration study from outside facility located and handed to Rubye Oaks NP.  Nothing further needed.

## 2022-06-09 ENCOUNTER — Encounter: Payer: Self-pay | Admitting: Family Medicine

## 2022-06-29 ENCOUNTER — Ambulatory Visit: Payer: Medicare Other | Admitting: Family Medicine

## 2022-07-08 NOTE — Telephone Encounter (Signed)
Can we check to see if he got SD card and bring by for download

## 2022-07-20 ENCOUNTER — Encounter: Payer: Self-pay | Admitting: *Deleted

## 2022-08-05 ENCOUNTER — Encounter: Payer: Self-pay | Admitting: Family Medicine

## 2022-08-05 DIAGNOSIS — G8929 Other chronic pain: Secondary | ICD-10-CM

## 2022-08-05 DIAGNOSIS — S42002K Fracture of unspecified part of left clavicle, subsequent encounter for fracture with nonunion: Secondary | ICD-10-CM

## 2022-08-05 DIAGNOSIS — M353 Polymyalgia rheumatica: Secondary | ICD-10-CM

## 2022-08-12 ENCOUNTER — Ambulatory Visit: Payer: Medicare Other | Admitting: Family Medicine

## 2022-08-14 ENCOUNTER — Encounter: Payer: Self-pay | Admitting: Family Medicine

## 2022-08-14 ENCOUNTER — Encounter: Payer: Self-pay | Admitting: *Deleted

## 2022-08-14 DIAGNOSIS — G8929 Other chronic pain: Secondary | ICD-10-CM | POA: Insufficient documentation

## 2022-08-14 NOTE — Addendum Note (Signed)
Addended by: Eustaquio Boyden on: 08/14/2022 11:48 AM   Modules accepted: Orders

## 2022-08-19 ENCOUNTER — Encounter: Payer: Self-pay | Admitting: Family Medicine

## 2022-08-19 ENCOUNTER — Ambulatory Visit (INDEPENDENT_AMBULATORY_CARE_PROVIDER_SITE_OTHER): Payer: Medicare Other | Admitting: Family Medicine

## 2022-08-19 ENCOUNTER — Other Ambulatory Visit: Payer: Self-pay | Admitting: Family Medicine

## 2022-08-19 ENCOUNTER — Ambulatory Visit (INDEPENDENT_AMBULATORY_CARE_PROVIDER_SITE_OTHER)
Admission: RE | Admit: 2022-08-19 | Discharge: 2022-08-19 | Disposition: A | Discharge status: Home / Self Care | Payer: Medicare Other | Source: Ambulatory Visit | Attending: Family Medicine | Admitting: Family Medicine

## 2022-08-19 DIAGNOSIS — G8929 Other chronic pain: Secondary | ICD-10-CM

## 2022-08-19 DIAGNOSIS — M25512 Pain in left shoulder: Secondary | ICD-10-CM | POA: Diagnosis not present

## 2022-08-19 DIAGNOSIS — S42002K Fracture of unspecified part of left clavicle, subsequent encounter for fracture with nonunion: Secondary | ICD-10-CM

## 2022-08-19 DIAGNOSIS — I10 Essential (primary) hypertension: Secondary | ICD-10-CM | POA: Diagnosis not present

## 2022-08-19 DIAGNOSIS — M1A09X0 Idiopathic chronic gout, multiple sites, without tophus (tophi): Secondary | ICD-10-CM

## 2022-08-19 MED ORDER — COLCHICINE 0.6 MG PO CAPS: 1.0000 | ORAL_CAPSULE | Freq: Every day | ORAL | 1 refills | Status: DC | PRN

## 2022-08-19 MED ORDER — WEGOVY 0.5 MG/0.5ML ~~LOC~~ SOAJ: 0.5000 mg | SUBCUTANEOUS | 3 refills | Status: DC

## 2022-08-19 MED ORDER — WEGOVY 0.25 MG/0.5ML ~~LOC~~ SOAJ: 0.2500 mg | SUBCUTANEOUS | 0 refills | Status: DC

## 2022-08-19 NOTE — Assessment & Plan Note (Addendum)
Phentermine effective but hesitant for ongoing use.  Patient is interested in [facility]. Reviewed mechanism of action of medication as well as side effects and adverse events to watch for including nausea, diarrhea, constipation, pancreatitis. No fmhx medullary thyroid cancer or MEN2. Discussed titration schedule for medication. Will start wegovy 0.25mg  weekly x 1 month then increase to 0.5mg  weekly.  Discussed need for regular visits for weight management to monitor medication effect and tolerance and weight loss, rec return 1-2 months after starting medication.

## 2022-08-19 NOTE — Telephone Encounter (Signed)
Message from pharmacy:  Alternative Requested:MEDICATION NOT COVERED BY INS.
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
## 2022-08-19 NOTE — Telephone Encounter (Signed)
Seen in office today  

## 2022-08-19 NOTE — Patient Instructions (Addendum)
We will await orthopedic evaluation. I will send in wegovy to price out. First month take 0.25mg  weekly for 1 month then increase to 0.5mg  weekly. Both doses sent to pharmacy.  Update me if unaffordable or with effect. Left clavicle xray today.
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
## 2022-08-19 NOTE — Assessment & Plan Note (Addendum)
Stable period on allopurinol 300mg  daily.  Refilled colchicine to use PRN.  He seems to tolerate hydrochlorothiazide well without worsening gout symptoms.

## 2022-08-19 NOTE — Assessment & Plan Note (Addendum)
Chronic, BP elevated today despite losartan hydrochlorothiazide and amlodipine.  Discussed possibly increasing amlodipine dose however want to avoid possible pedal edema side effect.  Will reassess hopefully after weight loss.
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
## 2022-08-20 NOTE — Telephone Encounter (Signed)
Pt will price out self pay

## 2022-09-02 ENCOUNTER — Other Ambulatory Visit (HOSPITAL_COMMUNITY): Payer: Self-pay

## 2022-09-02 ENCOUNTER — Telehealth: Payer: Self-pay

## 2022-09-02 NOTE — Telephone Encounter (Signed)
Pharmacy Patient Advocate Encounter   Received notification from CoverMyMeds that prior authorization for [facility] 0.25mg /51ml is required/requested.   Insurance verification completed.   The patient is insured through  [facility]  .   Per test claim: PA required; PA submitted to [facility] Medicare via CoverMyMeds Key/confirmation #/EOC [id number] Status is pending

## 2022-09-02 NOTE — Telephone Encounter (Signed)
Pharmacy Patient Advocate Encounter  Received notification from [facility] that Prior Authorization for Wegovy 0.25MG /0.5ML has been APPROVED from 08/19/22 to until further notice. Ran test claim, Copay is $869.04. This test claim was processed through [facility]- copay amounts may vary at other pharmacies due to pharmacy/plan contracts, or as the patient moves through the different stages of their insurance plan.   PA #/Case ID/Reference #: [id number]

## 2022-09-12 ENCOUNTER — Other Ambulatory Visit: Payer: Self-pay | Admitting: Family Medicine

## 2022-09-15 NOTE — Telephone Encounter (Signed)
Ok to refill as requested 

## 2022-09-19 ENCOUNTER — Other Ambulatory Visit: Payer: Self-pay | Admitting: Family Medicine

## 2022-09-21 NOTE — Telephone Encounter (Signed)
[id number] Last filled:  08/25/22, #2 mL Last OV:  08/19/22, wt mgmt f/u Next OV:  none

## 2022-09-22 NOTE — Telephone Encounter (Signed)
Denied. Next refill should be 0.5mg  wegovy, not another 0.25mg  dose.  I sent in 0.5mg  wegovy 1 month supply last month as continuation after finishing 0.25mg  dose.

## 2022-09-29 NOTE — Telephone Encounter (Signed)
PT called regarding this request, advised that message was sent to Dr. Reece Agar for review. Patient asked for these to be filled at [facility] [place], [place]

## 2022-09-29 NOTE — Telephone Encounter (Signed)
See 09/03/22 MyChart message.

## 2022-09-29 NOTE — Telephone Encounter (Signed)
Spoke with pt to get clarification of message. Pt was sent voucher for new box of 4 Wegovy 0.5 mg pens due to 2 in previous box malfunctioning. Says he needs new rx sent to [facility].

## 2022-09-30 MED ORDER — WEGOVY 0.5 MG/0.5ML ~~LOC~~ SOAJ: 0.5000 mg | SUBCUTANEOUS | 3 refills | Status: DC

## 2022-09-30 NOTE — Addendum Note (Signed)
Addended by: Eustaquio Boyden on: 09/30/2022 08:06 AM   Modules accepted: Orders

## 2022-10-22 ENCOUNTER — Other Ambulatory Visit (HOSPITAL_COMMUNITY): Payer: Self-pay

## 2022-10-30 ENCOUNTER — Encounter: Payer: Self-pay | Admitting: Family Medicine

## 2022-10-30 DIAGNOSIS — I10 Essential (primary) hypertension: Secondary | ICD-10-CM

## 2022-10-30 NOTE — Telephone Encounter (Signed)
QIHKVQ Last rx:  09/30/22, #2 mL Last OV:  08/19/22, wt mgmt f/u Next OV:  none

## 2022-11-02 MED ORDER — WEGOVY 0.5 MG/0.5ML ~~LOC~~ SOAJ: 0.5000 mg | SUBCUTANEOUS | 3 refills | Status: DC

## 2022-11-05 MED ORDER — AMLODIPINE BESYLATE 5 MG PO TABS
5.0000 mg | ORAL_TABLET | Freq: Every day | ORAL | 0 refills | Status: DC
Start: 2022-11-05 — End: 2022-12-09

## 2022-11-05 NOTE — Addendum Note (Signed)
Addended by: Nanci Pina on: 11/05/2022 08:34 AM   Modules accepted: Orders

## 2022-11-05 NOTE — Telephone Encounter (Signed)
E-scribed amlodipine.   Plz address message about Wegovy.

## 2022-11-23 ENCOUNTER — Other Ambulatory Visit (HOSPITAL_COMMUNITY): Payer: Self-pay

## 2022-12-09 ENCOUNTER — Encounter: Payer: Self-pay | Admitting: Family Medicine

## 2022-12-09 MED ORDER — WEGOVY 1 MG/0.5ML ~~LOC~~ SOAJ: 1.0000 mg | SUBCUTANEOUS | 3 refills | Status: AC

## 2022-12-14 ENCOUNTER — Other Ambulatory Visit (HOSPITAL_COMMUNITY): Payer: Self-pay

## 2022-12-18 ENCOUNTER — Other Ambulatory Visit: Payer: Self-pay | Admitting: Family Medicine

## 2022-12-18 DIAGNOSIS — I10 Essential (primary) hypertension: Secondary | ICD-10-CM

## 2022-12-18 NOTE — Telephone Encounter (Signed)
Rx sent 12/09/21, #90/4 [facility]   Request denied.

## 2022-12-20 ENCOUNTER — Encounter: Payer: Self-pay | Admitting: Family Medicine

## 2022-12-24 ENCOUNTER — Other Ambulatory Visit: Payer: Self-pay | Admitting: Family Medicine

## 2022-12-24 DIAGNOSIS — I10 Essential (primary) hypertension: Secondary | ICD-10-CM

## 2022-12-24 NOTE — Telephone Encounter (Signed)
ERx 

## 2022-12-25 ENCOUNTER — Other Ambulatory Visit: Payer: Self-pay | Admitting: Family Medicine

## 2022-12-25 DIAGNOSIS — I10 Essential (primary) hypertension: Secondary | ICD-10-CM

## 2022-12-25 DIAGNOSIS — E782 Mixed hyperlipidemia: Secondary | ICD-10-CM

## 2022-12-25 DIAGNOSIS — M1A09X0 Idiopathic chronic gout, multiple sites, without tophus (tophi): Secondary | ICD-10-CM

## 2022-12-25 MED ORDER — AMLODIPINE BESYLATE 5 MG PO TABS: 5.0000 mg | ORAL_TABLET | Freq: Every day | ORAL | 1 refills | Status: AC

## 2022-12-25 MED ORDER — ALLOPURINOL 300 MG PO TABS: 300.0000 mg | ORAL_TABLET | Freq: Every day | ORAL | 1 refills | Status: AC

## 2022-12-25 MED ORDER — ATORVASTATIN CALCIUM 40 MG PO TABS: 40.0000 mg | ORAL_TABLET | Freq: Every day | ORAL | 1 refills | Status: AC

## 2022-12-25 MED ORDER — AMLODIPINE BESYLATE 5 MG PO TABS: 5.0000 mg | ORAL_TABLET | Freq: Every day | ORAL | 0 refills | Status: DC

## 2022-12-25 NOTE — Telephone Encounter (Signed)
Pt has moved to [place] [place] [facility]. ERx while pt gets in with new PCP

## 2022-12-25 NOTE — Telephone Encounter (Signed)
E-[facility] [place]   Spoke with [facility] about amlodipine rx sent to them. States the correction has already been made to [facility] location.

## 2022-12-25 NOTE — Addendum Note (Signed)
Addended by: Eustaquio Boyden on: 12/25/2022 01:26 PM   Modules accepted: Orders

## 2022-12-25 NOTE — Telephone Encounter (Signed)
E prescribing error. Please fill allopurinol and amlodipine as pt has moved to [facility] #[id number] while pt gets in with new provider.

## 2022-12-25 NOTE — Addendum Note (Signed)
Addended by: Nanci Pina on: 12/25/2022 02:26 PM   Modules accepted: Orders

## 2023-01-25 ENCOUNTER — Telehealth: Payer: Self-pay | Admitting: Family Medicine

## 2023-01-25 NOTE — Telephone Encounter (Signed)
Contacted [facility] to schedule their annual wellness visit. Patient declined to schedule AWV at this time. Patient has moved to [place].  [facility] Care Guide [facility] AWV TEAM Direct Dial: [phone]

## 2023-12-06 ENCOUNTER — Telehealth: Payer: Self-pay | Admitting: Family Medicine

## 2023-12-06 NOTE — Telephone Encounter (Signed)
 Contacted Robert Shea to schedule their annual wellness visit. Patient declined to schedule AWV at this time. Patient state he does not live in the area anymore, and has told the office numerous times.Didn't give me any other information.  [facility] Care Guide [facility] AWV TEAM Direct Dial: [phone]

## 2023-12-16 NOTE — Telephone Encounter (Signed)
 Pcp removed

## 2024-02-04 ENCOUNTER — Other Ambulatory Visit: Payer: Self-pay | Admitting: Family Medicine

## 2024-02-04 NOTE — Telephone Encounter (Signed)
 No longer under prescriber's care. Pt has moved to [facility] (see 01/25/23 and 12/06/23 phn notes).
